# Patient Record
Sex: Male | Born: 1980 | Race: Black or African American | Hispanic: No | Marital: Single | Smoking: Current some day smoker
Health system: Southern US, Community
[De-identification: ages and names within clinical notes are randomized; demographics above are authoritative.]

## PROBLEM LIST (undated history)

## (undated) DIAGNOSIS — J45909 Unspecified asthma, uncomplicated: Secondary | ICD-10-CM

## (undated) DIAGNOSIS — H269 Unspecified cataract: Secondary | ICD-10-CM

## (undated) DIAGNOSIS — H409 Unspecified glaucoma: Secondary | ICD-10-CM

## (undated) DIAGNOSIS — A539 Syphilis, unspecified: Secondary | ICD-10-CM

## (undated) HISTORY — PX: WRIST SURGERY: SHX841

## (undated) HISTORY — DX: Unspecified cataract: H26.9

## (undated) HISTORY — DX: Unspecified glaucoma: H40.9

---

## 2004-01-23 ENCOUNTER — Emergency Department (HOSPITAL_COMMUNITY): Admission: EM | Admit: 2004-01-23 | Discharge: 2004-01-23 | Payer: Self-pay | Admitting: Emergency Medicine

## 2004-01-28 ENCOUNTER — Ambulatory Visit (HOSPITAL_BASED_OUTPATIENT_CLINIC_OR_DEPARTMENT_OTHER): Admission: RE | Admit: 2004-01-28 | Discharge: 2004-01-28 | Payer: Self-pay | Admitting: Orthopedic Surgery

## 2004-01-28 ENCOUNTER — Ambulatory Visit (HOSPITAL_COMMUNITY): Admission: RE | Admit: 2004-01-28 | Discharge: 2004-01-28 | Payer: Self-pay | Admitting: Orthopedic Surgery

## 2008-07-09 ENCOUNTER — Emergency Department (HOSPITAL_COMMUNITY): Admission: EM | Admit: 2008-07-09 | Discharge: 2008-07-09 | Payer: Self-pay | Admitting: Emergency Medicine

## 2010-07-04 ENCOUNTER — Emergency Department (HOSPITAL_COMMUNITY)
Admission: EM | Admit: 2010-07-04 | Discharge: 2010-07-04 | Payer: Self-pay | Source: Home / Self Care | Admitting: Family Medicine

## 2010-11-04 NOTE — Op Note (Signed)
NAME:  DASHAN, CHIZMAR                           ACCOUNT NO.:  [id number]   MEDICAL RECORD NO.:  [id number]                   PATIENT TYPE:  AMB   LOCATION:  DSC                                  FACILITY:  [facility]   PHYSICIAN:  Katy Fitch. Naaman Plummer., M.D.          DATE OF BIRTH:  06/21/1980   DATE OF PROCEDURE:  01/28/2004  DATE OF DISCHARGE:                                 OPERATIVE REPORT   PREOPERATIVE DIAGNOSIS:  Comminuted intra-articular fracture of right distal  radius.   POSTOPERATIVE DIAGNOSIS:  Comminuted intra-articular fracture of right  distal radius.   OPERATION PERFORMED:  Open reduction internal fixation of right distal  radius utilizing a 9 peg DVR plate system.   SURGEON:  Katy Fitch. Sypher, M.D.   ASSISTANT:  Jonni Sanger, P.A.   ANESTHESIA:  Sedation and infraclavicular block.   SUPERVISING ANESTHESIOLOGIST:  Janetta Hora. Gelene Mink, M.D.   INDICATIONS FOR PROCEDURE:  Asahd Can is a 30 year old gentleman who fell  early on the morning of January 23, 2004 sustaining a significant injury to  his right wrist.  He was seen at the [facility] Emergency Department where x-  rays revealed a comminuted intra-articular fracture of his radius.  He was  placed in a sugar tong splint and referred to our office for follow-up.  Clinical examination on January 25, 2004 revealed signs of a significantly  displaced intra-articular fracture of the radius with ulnar styloid  avulsion.  This was a fracture that was unstable and would require internal  fixation.  After informed consent, Darragh was brought to the operating room  at this time with indications for surgery, obtaining and maintaining  anatomic reduction of his fracture utilizing a fixed angled plate system.  Prior to surgery, questions were invited and answered.  He was informed of  potential risks and benefits of surgery, including infection, plate failure,  failure to heal and the need to remove the plate at a later  date.   DESCRIPTION OF PROCEDURE:  Zaviyar Rahal was brought to the operating room and  placed in supine position on the operating table.  Following infraclavicular  block by Dr. Gelene Mink in the holding area, anesthesia was complete in the  right arm.  The arm was prepped with Betadine soap and solution and  sterilely draped.  1 g of Ancef was administered as an IV prophylactic  antibiotic followed by Betadine scrub and paint of the arm.  The arm was  dressed with stockinet and impervious arthroscopy drapes.  Following  exsanguination of the right arm with an Esmarch bandage, an arterial  tourniquet was inflated to 230 mmHg.  The procedure commenced with a  standard DVR incision exposing the flexor carpi radialis.  This was  retracted in an ulnar direction and the fascia at the floor of its canal  released longitudinally.  The flexor pollicis longus was retracted in an  ulnar direction, the radial  artery in the radial direction and the pronator  quadratus carefully elevated with a periosteal elevator.  The fracture was  quite comminuted, particularly along the styloid side.  The fracture was  opened, clot removed.  The fracture fragments were reassembled anatomically  and a 9 peg DVR plate system was applied with standard technique.  Some  challenges were present due to severe impaction dorsally; however, after  considerable effort we obtained a virtually anatomic reduction with only a  slight displacement of several dorsal cortical fragments deep to the  extensor compartments.  The pegs and screws were placed in satisfactory  manner controlled with C-arm fluoroscopy.  After completion of the plate  placement, the wound was thoroughly lavaged with sterile saline followed by  repair of the pronator quadratus with mattress suture of 0 Vicryl and repair  of the skin with subdermal suture of 3-0 Vicryl and intradermal 3-0 Prolene.   Mr. Rodriquez was placed in a compressive dressing with a sugar  tong splint.  We  will maintain him in a supinated position for approximately 10 days followed  by advancement to a fiberglass splint.  Postoperatively we explained the  surgery to his parents in detail.  He was given a prescription for Percocet  5 mg one to two tablets by mouth every four to six hours as needed for pain,  20 tablets without refill.  Also Keflex 500 mg one by mouth every eight  hours times four days as a prophylactic antibiotic.  He is encouraged to use  ibuprofen for milder pain.                                               Katy Fitch Naaman Plummer., M.D.    RVS/MEDQ  D:  01/28/2004  T:  01/29/2004  Job:  [id number]

## 2010-11-04 NOTE — Consult Note (Signed)
[name], [name]                           ACCOUNT NO.:  [id number]   MEDICAL RECORD NO.:  [id number]                   PATIENT TYPE:  EMS   LOCATION:  MAJO                                 FACILITY:  [facility]   PHYSICIAN:  Vanita Panda. Magnus Ivan, M.D.       DATE OF BIRTH:  [birth year]   DATE OF CONSULTATION:  01/23/2004  DATE OF DISCHARGE:                                   CONSULTATION   CHIEF COMPLAINT:  Right wrist pain.   HISTORY OF PRESENT ILLNESS:  Briefly, Eric Stephens is a 30 year old, right hand  dominant male who fell down several steps early this a.m.  He has noted pain  and deformity of his right wrist and came to the emergency room at [facility].  He was found to have a right intraarticular distal radius fracture  with 100% displacement.  Orthopaedic surgery service was consulted.  Upon  examination of the patient, he reported slight decreased sensation in his  fingertips, but denied any other injuries.   PAST MEDICAL HISTORY:  Asthma.   PAST SURGICAL HISTORY:  Negative.   MEDICATIONS:  Multidose inhaler for asthma p.r.n.   ALLERGIES:  No known drug allergies.   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  He  has a positive history for tobacco use, but is only one pack every two days.  He denies drugs, but reports occasional alcohol use.   FAMILY HISTORY:  Noncontributory.   REVIEW OF SYSTEMS:  Negative for chest pain, shortness of breath, nausea,  vomiting, fever, chills, GI, GU complaints.   PHYSICAL EXAMINATION:  VITAL SIGNS:  Blood pressure 108/70; heart rate 66;  respiratory rate 18; temperature 97.5.  GENERAL:  This is an alert and oriented male who is in no acute distress,  but obvious discomfort of his right wrist.  HEENT:  Normocephalic, atraumatic.  Pupils equal, round and reactive to  light.  Extraocular muscles are intact.  NECK:  Supple.  No JVDs, no bruits.  LUNGS:  Clear to auscultation bilaterally.  HEART:  Regular  rate and rhythm.  No murmurs.  ABDOMEN:  Soft, nontender and nondistended.  Normoactive bowel sounds.  EXTREMITIES:  There is obvious deformity of the right wrist.  His skin is  intact.  He has 2+ radial and ulnar pulses.  He has slightly decreased  sensation at his fingertips on his index and long fingers and normal  sensation of the radial and ulnar distribution.  He has positive EPL, FPL,  FDS, FDP and his intrinsics.   X-rays are taken and are significant for a right comminuted distal radius  fracture, which is displaced dorsally and has an intraarticular component.   IMPRESSION:  This is a 30 year old, right hand dominant male with a right  comminuted distal radius fracture, which is intraarticular.   PLAN:  At this juncture, closed reduction was performed by myself in the  emergency room.  He was placed  in a well padded sugar-tong splint.  Post  reduction x-rays did show fracture with adequate reduction.  However, this  was shared with the patient as well as the recommendation that he undergo  open reduction, internal fixation later next week as an outpatient.  I have  instructed him about keeping this arm elevated.  I want him to keep it  elevated and some pain medication.  He will call if there are any questions  or concerns.  We will have him in the clinic either Monday or Tuesday and  get him set up for an outpatient surgery next week.  I explained the risks  and benefits of surgery as well as delaying the surgery.  He stated that he  understood all of these risks.                                               Vanita Panda. Magnus Ivan, M.D.    CYB/MEDQ  D:  01/23/2004  T:  01/24/2004  Job:  [id number]

## 2011-07-12 ENCOUNTER — Emergency Department (HOSPITAL_COMMUNITY): Payer: No Typology Code available for payment source

## 2011-07-12 ENCOUNTER — Emergency Department (HOSPITAL_COMMUNITY)
Admission: EM | Admit: 2011-07-12 | Discharge: 2011-07-12 | Disposition: A | Discharge status: Home / Self Care | Payer: No Typology Code available for payment source | Attending: Emergency Medicine | Admitting: Emergency Medicine

## 2011-07-12 ENCOUNTER — Encounter (HOSPITAL_COMMUNITY): Payer: Self-pay | Admitting: *Deleted

## 2011-07-12 DIAGNOSIS — M546 Pain in thoracic spine: Secondary | ICD-10-CM | POA: Insufficient documentation

## 2011-07-12 DIAGNOSIS — M542 Cervicalgia: Secondary | ICD-10-CM | POA: Insufficient documentation

## 2011-07-12 MED ORDER — IBUPROFEN 800 MG PO TABS
800.0000 mg | ORAL_TABLET | Freq: Once | ORAL | Status: AC
  Administered 2011-07-12: 800 mg via ORAL
  Filled 2011-07-12: qty 1

## 2011-07-12 MED ORDER — HYDROCODONE-ACETAMINOPHEN 5-325 MG PO TABS
1.0000 | ORAL_TABLET | Freq: Once | ORAL | Status: AC
  Administered 2011-07-12: 1 via ORAL
  Filled 2011-07-12: qty 1

## 2011-07-12 MED ORDER — DIAZEPAM 5 MG PO TABS
5.0000 mg | ORAL_TABLET | Freq: Once | ORAL | Status: AC
  Administered 2011-07-12: 5 mg via ORAL
  Filled 2011-07-12: qty 1

## 2011-07-12 MED ORDER — IBUPROFEN 800 MG PO TABS: 800.0000 mg | ORAL_TABLET | Freq: Three times a day (TID) | ORAL | Status: AC | PRN

## 2011-07-12 MED ORDER — DIAZEPAM 5 MG PO TABS: 5.0000 mg | ORAL_TABLET | Freq: Three times a day (TID) | ORAL | Status: AC | PRN

## 2011-07-12 MED ORDER — HYDROCODONE-ACETAMINOPHEN 5-325 MG PO TABS: 1.0000 | ORAL_TABLET | Freq: Four times a day (QID) | ORAL | Status: AC | PRN
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
## 2011-07-12 NOTE — ED Provider Notes (Signed)
Medical screening examination/treatment/procedure(s) were performed by non-physician practitioner and as supervising physician I was immediately available for consultation/collaboration.   Rolan Bucco, MD 07/12/11 1538

## 2013-03-18 ENCOUNTER — Emergency Department (HOSPITAL_COMMUNITY)
Admission: EM | Admit: 2013-03-18 | Discharge: 2013-03-18 | Disposition: A | Discharge status: Home / Self Care | Payer: Self-pay | Attending: Emergency Medicine | Admitting: Emergency Medicine

## 2013-03-18 ENCOUNTER — Emergency Department (HOSPITAL_COMMUNITY): Payer: No Typology Code available for payment source

## 2013-03-18 ENCOUNTER — Encounter (HOSPITAL_COMMUNITY): Payer: Self-pay | Admitting: *Deleted

## 2013-03-18 DIAGNOSIS — Y9301 Activity, walking, marching and hiking: Secondary | ICD-10-CM | POA: Insufficient documentation

## 2013-03-18 DIAGNOSIS — W010XXA Fall on same level from slipping, tripping and stumbling without subsequent striking against object, initial encounter: Secondary | ICD-10-CM | POA: Insufficient documentation

## 2013-03-18 DIAGNOSIS — S82401A Unspecified fracture of shaft of right fibula, initial encounter for closed fracture: Secondary | ICD-10-CM

## 2013-03-18 DIAGNOSIS — S82899A Other fracture of unspecified lower leg, initial encounter for closed fracture: Secondary | ICD-10-CM | POA: Insufficient documentation

## 2013-03-18 DIAGNOSIS — F17200 Nicotine dependence, unspecified, uncomplicated: Secondary | ICD-10-CM | POA: Insufficient documentation

## 2013-03-18 DIAGNOSIS — Y9289 Other specified places as the place of occurrence of the external cause: Secondary | ICD-10-CM | POA: Insufficient documentation

## 2013-03-18 MED ORDER — OXYCODONE-ACETAMINOPHEN 5-325 MG PO TABS
1.0000 | ORAL_TABLET | Freq: Once | ORAL | Status: AC
  Administered 2013-03-18: 1 via ORAL
  Filled 2013-03-18: qty 1

## 2013-03-18 MED ORDER — OXYCODONE-ACETAMINOPHEN 5-325 MG PO TABS: 1.0000 | ORAL_TABLET | Freq: Three times a day (TID) | ORAL | Status: DC | PRN

## 2013-03-18 NOTE — ED Notes (Signed)
Reports tripping and falling down a hill yesterday and now having right ankle pain. No acute distress noted at triage.

## 2013-03-18 NOTE — Progress Notes (Signed)
Orthopedic Tech Progress Note Patient Details:  Eric Stephens 07-11-1980 [id number]  Ortho Devices Type of Ortho Device: Crutches;Post (short leg) splint;Stirrup splint Ortho Device/Splint Interventions: Application   Cammer, Mickie Bail 03/18/2013, 2:27 PM

## 2013-03-18 NOTE — ED Provider Notes (Signed)
CSN: [id number]     Arrival date & time 03/18/13  0906 History   First MD Initiated Contact with Patient 03/18/13 0914     Chief Complaint  Patient presents with  . Fall  . Ankle Pain   (Consider location/radiation/quality/duration/timing/severity/associated sxs/prior Treatment) Patient is a 32 y.o. male presenting with fall and ankle pain. The history is provided by the patient. No language interpreter was used.  Fall Associated symptoms include arthralgias (right ankle). Pertinent negatives include no chest pain, numbness or weakness.  Ankle Pain Eric Stephens is a 32 year old male presenting to the emergency department with right ankle pain that started yesterday. Patient reports that as he was going downhill he accidentally slipped and landed on his right ankle, anterior aspect, in a plantar flexed manner. Patient reports that he noticed right ankle swelling, stated that there is a stinging, pinching sensation that only occurs when he applies pressure to the right ankle. Patient reports that the pain radiates to the top of his right foot. Patient reports that pain worsens when applying pressure, reported feels better when the ankle is elevated. Patient reports he's been elevating the ankle, denied any ice or over-the-counter medication. Patient denied previous injury to the ankle, loss of sensation, numbness, tingling, chest pain, shortness of breath, difficulty breathing. PCP none  History reviewed. No pertinent past medical history. Past Surgical History  Procedure Laterality Date  . Wrist surgery     History reviewed. No pertinent family history. History  Substance Use Topics  . Smoking status: Current Every Day Smoker  . Smokeless tobacco: Not on file  . Alcohol Use: Yes    Review of Systems  Respiratory: Negative for chest tightness and shortness of breath.   Cardiovascular: Negative for chest pain.  Musculoskeletal: Positive for arthralgias (right ankle).  Neurological:  Negative for weakness and numbness.  All other systems reviewed and are negative.    Allergies  Review of patient's allergies indicates no known allergies.  Home Medications   Current Outpatient Rx  Name  Route  Sig  Dispense  Refill  . oxyCODONE-acetaminophen (PERCOCET/ROXICET) 5-325 MG per tablet   Oral   Take 1 tablet by mouth every 8 (eight) hours as needed for pain.   11 tablet   0    BP 106/75  Pulse 64  Temp(Src) 98.1 F (36.7 C) (Oral)  Resp 18  SpO2 98% Physical Exam  Nursing note and vitals reviewed. Constitutional: He is oriented to person, place, and time. He appears well-developed and well-nourished. No distress.  HENT:  Head: Normocephalic and atraumatic.  Eyes: Pupils are equal, round, and reactive to light.  Neck: Normal range of motion. Neck supple.  Cardiovascular: Normal rate, regular rhythm and normal heart sounds.  Exam reveals no friction rub.   No murmur heard. Pulses:      Radial pulses are 2+ on the right side, and 2+ on the left side.       Dorsalis pedis pulses are 2+ on the right side, and 2+ on the left side.  Pulmonary/Chest: Effort normal and breath sounds normal. No respiratory distress. He has no wheezes. He has no rales.  Musculoskeletal: He exhibits tenderness.       Feet:  Swelling noted to the right ankle, circumferential. Negative signs of ecchymosis, erythema, inflammation, puncture wounds. Swelling noted to the dorsal aspect of the right foot, negative ecchymosis, erythema, inflammation, puncture wounds noted. Mild decreased range of motion to the right ankle-inversion, eversion, dorsi flexion, plantar flexion secondary to  pain. Pain upon palpation to the right ankle, circumferential. Full range of motion to the right knee, right digits of the right foot.  Neurological: He is alert and oriented to person, place, and time.  Strength 5+/5+ to digits of the right foot and right ankle, with resistance applied, equal distribution  identified. Sensation intact to bilateral lower extremities with differentiation to sharp and dull touch.  Skin: He is not diaphoretic.    ED Course  Procedures (including critical care time) Labs Review Labs Reviewed - No data to display Imaging Review Dg Ankle Complete Right  03/18/2013   CLINICAL DATA:  Pain post trauma  EXAM: RIGHT ANKLE - COMPLETE 3+ VIEW  COMPARISON:  None.  FINDINGS: Frontal, oblique, and lateral views were obtained. There is a spiral fracture of the distal fibular diaphysis near the diaphysis -metaphysis junction. Alignment is near anatomic. No other fracture. Ankle mortise appears intact. No appreciable joint effusion.  IMPRESSION: Spiral fracture distal fibula.   Electronically Signed   By: Bretta Bang   On: 03/18/2013 10:15   Dg Foot Complete Right  03/18/2013   CLINICAL DATA:  Pain post trauma  EXAM: RIGHT FOOT COMPLETE - 3+ VIEW  COMPARISON:  None.  FINDINGS: Frontal, oblique, and lateral views were obtained. There is a fracture of the distal fibular diaphysis. No other fracture. No dislocation. Joint spaces appear intact. There is pes planus.  IMPRESSION: Fracture distal fibula. In the foot region, there is no demonstrable fracture or dislocation. No arthropathy. Pes planus.   Electronically Signed   By: Bretta Bang   On: 03/18/2013 10:16    MDM   1. Fibula fracture, right, closed, initial encounter     Patient presenting to emergency department with right ankle injury that occurred yesterday while patient was walking downhill, he slipped and landed on his anterior aspect of the right ankle in a plantar flexed manner. Reported that he's been elevating the ankle, denied any ice or medications. Alert and oriented. Swelling noted to the right ankle, circumferential-negative signs of erythema, inflammation, puncture wounds. Decreased range of motion to the right ankle secondary to pain. Strength intact with resistance, equal distribution. Sensation  intact. Pulses palpable bilaterally, strong. Plain films identified spiral fracture of the distal fibular diaphysis near the diaphysis metaphysis junction. Patient placed in splint and given crutches. Patient stable, afebrile. Closed fracture, spiral fracture of the distal fibula of the right leg. Discharged patient with small dose of pain medications-discussed proper dosage use, precautions, disposal techniques. Discussed with patient to report and followup with orthopedics as outpatient. Discussed with patient to elevate the ankle. Discussed with patient to avoid any strenuous activities. Discussed with patient to continue to monitor symptoms and if symptoms are to worsen or change to report back to the emergency department - strict return instructions given. Patient agreed to plan of care, understood, all questions answered.   Raymon Mutton, PA-C 03/18/13 1814

## 2013-03-19 NOTE — ED Provider Notes (Signed)
Medical screening examination/treatment/procedure(s) were performed by non-physician practitioner and as supervising physician I was immediately available for consultation/collaboration.  Doug Sou, MD 03/19/13 1140

## 2015-01-14 ENCOUNTER — Emergency Department (HOSPITAL_COMMUNITY)
Admission: EM | Admit: 2015-01-14 | Discharge: 2015-01-14 | Disposition: A | Discharge status: Home / Self Care | Payer: Self-pay | Attending: Emergency Medicine | Admitting: Emergency Medicine

## 2015-01-14 ENCOUNTER — Emergency Department (HOSPITAL_COMMUNITY): Payer: Self-pay

## 2015-01-14 ENCOUNTER — Encounter (HOSPITAL_COMMUNITY): Payer: Self-pay | Admitting: Emergency Medicine

## 2015-01-14 DIAGNOSIS — Y9389 Activity, other specified: Secondary | ICD-10-CM | POA: Insufficient documentation

## 2015-01-14 DIAGNOSIS — Y9289 Other specified places as the place of occurrence of the external cause: Secondary | ICD-10-CM | POA: Insufficient documentation

## 2015-01-14 DIAGNOSIS — Z720 Tobacco use: Secondary | ICD-10-CM | POA: Insufficient documentation

## 2015-01-14 DIAGNOSIS — S3992XA Unspecified injury of lower back, initial encounter: Secondary | ICD-10-CM | POA: Insufficient documentation

## 2015-01-14 DIAGNOSIS — W108XXA Fall (on) (from) other stairs and steps, initial encounter: Secondary | ICD-10-CM | POA: Insufficient documentation

## 2015-01-14 DIAGNOSIS — Y998 Other external cause status: Secondary | ICD-10-CM | POA: Insufficient documentation

## 2015-01-14 DIAGNOSIS — S7002XA Contusion of left hip, initial encounter: Secondary | ICD-10-CM | POA: Insufficient documentation

## 2015-01-14 MED ORDER — IBUPROFEN 800 MG PO TABS: 800.0000 mg | ORAL_TABLET | Freq: Three times a day (TID) | ORAL | Status: DC

## 2015-01-14 MED ORDER — IBUPROFEN 800 MG PO TABS
800.0000 mg | ORAL_TABLET | Freq: Once | ORAL | Status: AC
  Administered 2015-01-14: 800 mg via ORAL
  Filled 2015-01-14: qty 1

## 2015-01-14 NOTE — ED Notes (Signed)
Bed: [facility] Expected date:  Expected time:  Means of arrival:  Comments: 

## 2015-01-14 NOTE — ED Notes (Signed)
Pt reports falling down steps approximately 1200, ambulatory since, now c/o lower back pain. Denies neck and thoracic pain.   VS 110/79, 72hr, cbg 88

## 2015-01-14 NOTE — ED Provider Notes (Signed)
CSN: [id number]     Arrival date & time 01/14/15  2111 History   First MD Initiated Contact with Patient 01/14/15 2218     Chief Complaint  Patient presents with  . Fall  . Back Pain     (Consider location/radiation/quality/duration/timing/severity/associated sxs/prior Treatment) HPI   34 year old male presents for evaluation of a recent fall. Patient reports he was leaving the building at noon earlier today when he slipped on the stab fall down one step striking his left low back and left hip against the steps. He denies hitting his head or loss of consciousness. He was able to ambulate afterward. He was told that the security that he would need to get it checked out if he wants to file any claim. He is here for further evaluation. Currently patient complaining of sharp throbbing pain to left hip, rated as 7 out of 10, worsening with movement. No specific treatment tried. Denies any radicular pain. No headache, neck pain, chest pain, shortness of breath, abdominal pain, numbness or weakness. Denies any precipitating symptoms prior to the fall.  History reviewed. No pertinent past medical history. Past Surgical History  Procedure Laterality Date  . Wrist surgery     No family history on file. History  Substance Use Topics  . Smoking status: Current Every Day Smoker -- 0.50 packs/day  . Smokeless tobacco: Not on file  . Alcohol Use: No    Review of Systems  Constitutional: Negative for fever.  Musculoskeletal: Positive for back pain.  Skin: Negative for rash and wound.  Neurological: Negative for numbness.      Allergies  Review of patient's allergies indicates no known allergies.  Home Medications   Prior to Admission medications   Medication Sig Start Date End Date Taking? Authorizing Provider  Aspirin-Salicylamide-Caffeine (BC HEADACHE POWDER PO) Take 1 packet by mouth daily as needed (pain).   Yes Historical Provider, MD  oxyCODONE-acetaminophen (PERCOCET/ROXICET)  5-325 MG per tablet Take 1 tablet by mouth every 8 (eight) hours as needed for pain. Patient not taking: Reported on 01/14/2015 03/18/13   Marissa Sciacca, PA-C   BP 111/66 mmHg  Pulse 65  Temp(Src) 98.1 F (36.7 C) (Oral)  Resp 18  SpO2 100% Physical Exam  Constitutional: He appears well-developed and well-nourished. No distress.  African-American male laying in bed, appears to be in no acute distress.  HENT:  Head: Atraumatic.  Eyes: Conjunctivae are normal.  Neck: Neck supple.  Musculoskeletal: He exhibits tenderness (Left hip. Tenderness to greater trochanter to palpation without overlying skin changes. Normal hip flexion, extension, abduction abduction. No gross deformity or crepitus.).  No significant midline spine tenderness crepitus step-off. No pain to left knee or left ankle.  Neurological: He is alert.  Able to ambulate.  Skin: No rash noted.  Psychiatric: He has a normal mood and affect.  Nursing note and vitals reviewed.   ED Course  Procedures (including critical care time)  Patient had a mechanical fall and complaining of left hip pain. X-ray of left hip shows no evidence of acute fractures or dislocation. Patient is able to ambulate without difficulty. Ibuprofen given for pain. Rice therapy discussed. Orthopedic referral given as needed. Return precautions discussed.  Labs Review Labs Reviewed - No data to display  Imaging Review Dg Hip Unilat With Pelvis 2-3 Views Left  01/14/2015   CLINICAL DATA:  Larey Seat today, striking left hip on the corner of a shelf  EXAM: DG HIP (WITH OR WITHOUT PELVIS) 2-3V LEFT  COMPARISON:  None.  FINDINGS: There is no evidence of hip fracture or dislocation. There is no evidence of arthropathy or other focal bone abnormality.  IMPRESSION: Negative.   Electronically Signed   By: Ellery Plunk M.D.   On: 01/14/2015 21:47     EKG Interpretation None      MDM   Final diagnoses:  Contusion of left hip, initial encounter    BP  111/66 mmHg  Pulse 65  Temp(Src) 98.1 F (36.7 C) (Oral)  Resp 18  SpO2 100%  I have reviewed nursing notes and vital signs. I personally viewed the imaging tests through PACS system and agrees with radiologist's intepretation I reviewed available ER/hospitalization records through the EMR     Fayrene Helper, PA-C 01/14/15 2301  Devoria Albe, MD 01/14/15 2306

## 2015-01-14 NOTE — ED Notes (Signed)
Bed: WA17 Expected date:  Expected time:  Means of arrival:  Comments: EMS fall back pain and left hip pain

## 2015-01-14 NOTE — ED Notes (Signed)
Patient transported to X-ray 

## 2015-01-14 NOTE — Discharge Instructions (Signed)
RICE: Routine Care for Injuries The routine care of many injuries includes Rest, Ice, Compression, and Elevation (RICE). HOME CARE INSTRUCTIONS  Rest is needed to allow your body to heal. Routine activities can usually be resumed when comfortable. Injured tendons and bones can take up to 6 weeks to heal. Tendons are the cord-like structures that attach muscle to bone.  Ice following an injury helps keep the swelling down and reduces pain.  Put ice in a plastic bag.  Place a towel between your skin and the bag.  Leave the ice on for 15-20 minutes, 3-4 times a day, or as directed by your health care provider. Do this while awake, for the first 24 to 48 hours. After that, continue as directed by your caregiver.  Compression helps keep swelling down. It also gives support and helps with discomfort. If an elastic bandage has been applied, it should be removed and reapplied every 3 to 4 hours. It should not be applied tightly, but firmly enough to keep swelling down. Watch fingers or toes for swelling, bluish discoloration, coldness, numbness, or excessive pain. If any of these problems occur, remove the bandage and reapply loosely. Contact your caregiver if these problems continue.  Elevation helps reduce swelling and decreases pain. With extremities, such as the arms, hands, legs, and feet, the injured area should be placed near or above the level of the heart, if possible. SEEK IMMEDIATE MEDICAL CARE IF:  You have persistent pain and swelling.  You develop redness, numbness, or unexpected weakness.  Your symptoms are getting worse rather than improving after several days. These symptoms may indicate that further evaluation or further X-rays are needed. Sometimes, X-rays may not show a small broken bone (fracture) until 1 week or 10 days later. Make a follow-up appointment with your caregiver. Ask when your X-ray results will be ready. Make sure you get your X-ray results. Document Released:  09/17/2000 Document Revised: 06/10/2013 Document Reviewed: 11/04/2010 Exit[employer] Patient Information 2015 [employer]. This information is not intended to replace advice given to you by your health care provider. Make sure you discuss any questions you have with your health care provider.  

## 2015-12-29 ENCOUNTER — Emergency Department (HOSPITAL_COMMUNITY)
Admission: EM | Admit: 2015-12-29 | Discharge: 2015-12-29 | Disposition: A | Discharge status: Home / Self Care | Payer: Self-pay | Attending: Emergency Medicine | Admitting: Emergency Medicine

## 2015-12-29 ENCOUNTER — Encounter (HOSPITAL_COMMUNITY): Payer: Self-pay | Admitting: Emergency Medicine

## 2015-12-29 ENCOUNTER — Emergency Department (HOSPITAL_COMMUNITY): Payer: Self-pay

## 2015-12-29 DIAGNOSIS — Z5181 Encounter for therapeutic drug level monitoring: Secondary | ICD-10-CM | POA: Insufficient documentation

## 2015-12-29 DIAGNOSIS — F17200 Nicotine dependence, unspecified, uncomplicated: Secondary | ICD-10-CM | POA: Insufficient documentation

## 2015-12-29 DIAGNOSIS — R410 Disorientation, unspecified: Secondary | ICD-10-CM | POA: Insufficient documentation

## 2015-12-29 DIAGNOSIS — B20 Human immunodeficiency virus [HIV] disease: Secondary | ICD-10-CM | POA: Insufficient documentation

## 2015-12-29 DIAGNOSIS — R4781 Slurred speech: Secondary | ICD-10-CM | POA: Insufficient documentation

## 2015-12-29 HISTORY — DX: Syphilis, unspecified: A53.9

## 2015-12-29 LAB — CBC WITH DIFFERENTIAL/PLATELET
Basophils Absolute: 0 10*3/uL (ref 0.0–0.1)
Basophils Relative: 0 %
EOS PCT: 1 %
Eosinophils Absolute: 0.1 10*3/uL (ref 0.0–0.7)
HEMATOCRIT: 36.4 % — AB (ref 39.0–52.0)
HEMOGLOBIN: 12.2 g/dL — AB (ref 13.0–17.0)
LYMPHS ABS: 2.9 10*3/uL (ref 0.7–4.0)
LYMPHS PCT: 37 %
MCH: 29.5 pg (ref 26.0–34.0)
MCHC: 33.5 g/dL (ref 30.0–36.0)
MCV: 87.9 fL (ref 78.0–100.0)
Monocytes Absolute: 0.5 10*3/uL (ref 0.1–1.0)
Monocytes Relative: 7 %
NEUTROS ABS: 4.2 10*3/uL (ref 1.7–7.7)
Neutrophils Relative %: 55 %
PLATELETS: 233 10*3/uL (ref 150–400)
RBC: 4.14 MIL/uL — AB (ref 4.22–5.81)
RDW: 13.5 % (ref 11.5–15.5)
WBC: 7.7 10*3/uL (ref 4.0–10.5)

## 2015-12-29 LAB — COMPREHENSIVE METABOLIC PANEL
ALK PHOS: 79 U/L (ref 38–126)
ALT: 13 U/L — AB (ref 17–63)
AST: 16 U/L (ref 15–41)
Albumin: 3.9 g/dL (ref 3.5–5.0)
Anion gap: 7 (ref 5–15)
BILIRUBIN TOTAL: 0.4 mg/dL (ref 0.3–1.2)
BUN: 11 mg/dL (ref 6–20)
CALCIUM: 8.9 mg/dL (ref 8.9–10.3)
CO2: 25 mmol/L (ref 22–32)
CREATININE: 0.77 mg/dL (ref 0.61–1.24)
Chloride: 108 mmol/L (ref 101–111)
Glucose, Bld: 99 mg/dL (ref 65–99)
Potassium: 3.4 mmol/L — ABNORMAL LOW (ref 3.5–5.1)
Sodium: 140 mmol/L (ref 135–145)
TOTAL PROTEIN: 6.9 g/dL (ref 6.5–8.1)

## 2015-12-29 LAB — RAPID URINE DRUG SCREEN, HOSP PERFORMED
Amphetamines: NOT DETECTED
Barbiturates: NOT DETECTED
Benzodiazepines: NOT DETECTED
Cocaine: NOT DETECTED
OPIATES: NOT DETECTED
Tetrahydrocannabinol: NOT DETECTED

## 2015-12-29 LAB — AMMONIA: AMMONIA: 60 umol/L — AB (ref 9–35)

## 2015-12-29 LAB — ETHANOL

## 2015-12-29 NOTE — ED Notes (Addendum)
[facility] father at bedside noticed change in patients mentation. Pt seems more confused and forgetful since in custody and now out symptoms continue. Denies psych hx  Oriented to self and date of birth. NAD at triage.   Addendum: states he was dx with HIV and syphilis while in prison, unsure if any treatment was given while in jail.

## 2015-12-29 NOTE — ED Notes (Signed)
Pt ambulatory and independent at discharge.  Left with father.  Verbalized understanding of discharge instructions.

## 2015-12-29 NOTE — ED Notes (Signed)
Patient transported to CT 

## 2015-12-29 NOTE — ED Provider Notes (Signed)
CSN: [id number]     Arrival date & time 12/29/15  1619 History   First MD Initiated Contact with Patient 12/29/15 2207     Chief Complaint  Patient presents with  . Altered Mental Status     History obtained by patient and his father. Patient is a 35 y.o. male presenting with altered mental status.  Altered Mental Status Presenting symptoms: confusion   Associated symptoms: no abdominal pain, no headaches, no nausea, no rash, no vomiting and no weakness   Patient's been a little more confused recently. Slight headaches. The headaches in both temples. Has been out of jail for the last few months but reportedly in jail was diagnosed with HIV and syphilis. States he had 2 different shots for it but did not have follow-up.  Past Medical History  Diagnosis Date  . HIV (human immunodeficiency virus infection) (HCC)   . Syphilis    Past Surgical History  Procedure Laterality Date  . Wrist surgery     No family history on file. Social History  Substance Use Topics  . Smoking status: Current Every Day Smoker -- 0.50 packs/day  . Smokeless tobacco: None  . Alcohol Use: No    Review of Systems  Constitutional: Negative for activity change and appetite change.  Eyes: Negative for pain.  Respiratory: Negative for chest tightness and shortness of breath.   Cardiovascular: Negative for chest pain and leg swelling.  Gastrointestinal: Negative for nausea, vomiting, abdominal pain and diarrhea.  Genitourinary: Negative for flank pain.  Musculoskeletal: Negative for back pain and neck stiffness.  Skin: Negative for rash.  Neurological: Negative for weakness, numbness and headaches.  Psychiatric/Behavioral: Positive for confusion. Negative for behavioral problems.      Allergies  Review of patient's allergies indicates no known allergies.  Home Medications   Prior to Admission medications   Medication Sig Start Date End Date Taking? Authorizing Provider   Aspirin-Salicylamide-Caffeine (BC HEADACHE POWDER PO) Take 1 packet by mouth daily as needed (pain).    Historical Provider, MD  ibuprofen (ADVIL,MOTRIN) 800 MG tablet Take 1 tablet (800 mg total) by mouth 3 (three) times daily. Patient not taking: Reported on 12/29/2015 01/14/15   Fayrene HelperBowie Tran, PA-C  oxyCODONE-acetaminophen (PERCOCET/ROXICET) 5-325 MG per tablet Take 1 tablet by mouth every 8 (eight) hours as needed for pain. Patient not taking: Reported on 01/14/2015 03/18/13   Marissa Sciacca, PA-C   BP 118/65 mmHg  Pulse 60  Temp(Src) 99.7 F (37.6 C) (Oral)  Resp 18  Ht 6' (1.829 m)  Wt 212 lb (96.163 kg)  BMI 28.75 kg/m2  SpO2 100% Physical Exam  Constitutional: He is oriented to person, place, and time. He appears well-developed and well-nourished.  HENT:  Head: Normocephalic and atraumatic.  Eyes: EOM are normal. Pupils are equal, round, and reactive to light.  Neck: Normal range of motion. Neck supple.  Cardiovascular: Normal rate, regular rhythm and normal heart sounds.   No murmur heard. Pulmonary/Chest: Effort normal and breath sounds normal.  Abdominal: Soft. Bowel sounds are normal. He exhibits no distension and no mass. There is no tenderness. There is no rebound and no guarding.  Musculoskeletal: Normal range of motion. He exhibits no edema.  Neurological: He is alert and oriented to person, place, and time. No cranial nerve deficit.  slightly slurred speech. Baseline slurred but maybe a little more than baseline.  Skin: Skin is warm and dry.  Psychiatric: He has a normal mood and affect.  Nursing note and vitals reviewed.  ED Course  Procedures (including critical care time) Labs Review Labs Reviewed  CBC WITH DIFFERENTIAL/PLATELET - Abnormal; Notable for the following:    RBC 4.14 (*)    Hemoglobin 12.2 (*)    HCT 36.4 (*)    All other components within normal limits  COMPREHENSIVE METABOLIC PANEL - Abnormal; Notable for the following:    Potassium 3.4 (*)     ALT 13 (*)    All other components within normal limits  AMMONIA - Abnormal; Notable for the following:    Ammonia 60 (*)    All other components within normal limits  ETHANOL  URINE RAPID DRUG SCREEN, HOSP PERFORMED  T-HELPER CELLS (CD4) COUNT (NOT AT [facility])  HIV-1 RNA ULTRAQUANT REFLEX TO GENTYP+  RPR    Imaging Review Dg Chest 2 View  12/29/2015  CLINICAL DATA:  Smoker.  Altered mental status.  History of HIV. EXAM: CHEST  2 VIEW COMPARISON:  None. FINDINGS: The heart size and mediastinal contours are within normal limits. Both lungs are clear. The visualized skeletal structures are unremarkable. IMPRESSION: No active cardiopulmonary disease. Electronically Signed   By: Burman Nieves M.D.   On: 12/29/2015 22:43   Ct Head Wo Contrast  12/29/2015  CLINICAL DATA:  Confusion, altered mental status. History of HIV and syphilis. EXAM: CT HEAD WITHOUT CONTRAST TECHNIQUE: Contiguous axial images were obtained from the base of the skull through the vertex without intravenous contrast. COMPARISON:  None available for comparison at time of study interpretation. FINDINGS: INTRACRANIAL CONTENTS: The mild ventriculomegaly on the basis of global parenchymal brain volume loss. No intraparenchymal hemorrhage, mass effect nor midline shift. No acute large vascular territory infarcts. No abnormal extra-axial fluid collections. Basal cisterns are patent. ORBITS: The included ocular globes and orbital contents are normal. SINUSES: The mastoid aircells and included paranasal sinuses are well-aerated. SKULL/SOFT TISSUES: No skull fracture. No significant soft tissue swelling. IMPRESSION: No acute intracranial process. Mild global parenchymal brain volume loss for age. Electronically Signed   By: Awilda Metro M.D.   On: 12/29/2015 22:38   I have personally reviewed and evaluated these images and lab results as part of my medical decision-making.   EKG Interpretation None      MDM   Final diagnoses:   Confusion    Patient with slight confusion. Head CT reassuring. Ammonia mildly elevated of unclear significance. Reportedly his HIV and syphilis positive. Labs ordered and will follow-up with infectious disease. Discharge home.    Benjiman Core, MD 12/30/15 [phone]

## 2015-12-29 NOTE — ED Notes (Signed)
Pt A&O x4 at this time but has very scattered thoughts and actions.

## 2015-12-29 NOTE — ED Notes (Signed)
RN starting IV 

## 2015-12-29 NOTE — Discharge Instructions (Signed)
Confusion Confusion is the inability to think with your usual speed or clarity. Confusion may come on quickly or slowly over time. How quickly the confusion comes on depends on the cause. Confusion can be due to any number of causes. CAUSES   Concussion, head injury, or head trauma.  Seizures.  Stroke.  Fever.  Brain tumor.  Age related decreased brain function (dementia).  Heightened emotional states like rage or terror.  Mental illness in which the person loses the ability to determine what is real and what is not (hallucinations).  Infections such as a urinary tract infection (UTI).  Toxic effects from alcohol, drugs, or prescription medicines.  Dehydration and an imbalance of salts in the body (electrolytes).  Lack of sleep.  Low blood sugar (diabetes).  Low levels of oxygen from conditions such as chronic lung disorders.  Drug interactions or other medicine side effects.  Nutritional deficiencies, especially niacin, thiamine, vitamin C, or vitamin B.  Sudden drop in body temperature (hypothermia).  Change in routine, such as when traveling or hospitalized. SIGNS AND SYMPTOMS  People often describe their thinking as cloudy or unclear when they are confused. Confusion can also include feeling disoriented. That means you are unaware of where or who you are. You may also not know what the date or time is. If confused, you may also have difficulty paying attention, remembering, and making decisions. Some people also act aggressively when they are confused.  DIAGNOSIS  The medical evaluation of confusion may include:  Blood and urine tests.  X-rays.  Brain and nervous system tests.  Analyzing your brain waves (electroencephalogram or EEG).  Magnetic resonance imaging (MRI) of your head.  Computed tomography (CT) scan of your head.  Mental status tests in which your health care provider may ask many questions. Some of these questions may seem silly or strange,  but they are a very important test to help diagnose and treat confusion. TREATMENT  An admission to the hospital may not be needed, but a person with confusion should not be left alone. Stay with a family member or friend until the confusion clears. Avoid alcohol, pain relievers, or sedative drugs until you have fully recovered. Do not drive until directed by your health care provider. HOME CARE INSTRUCTIONS  What family and friends can do:  To find out if someone is confused, ask the person to state his or her name, age, and the date. If the person is unsure or answers incorrectly, he or she is confused.  Always introduce yourself, no matter how well the person knows you.  Often remind the person of his or her location.  Place a calendar and clock near the confused person.  Help the person with his or her medicines. You may want to use a pill box, an alarm as a reminder, or give the person each dose as prescribed.  Talk about current events and plans for the day.  Try to keep the environment calm, quiet, and peaceful.  Make sure the person keeps follow-up visits with his or her health care provider. PREVENTION  Ways to prevent confusion:  Avoid alcohol.  Eat a balanced diet.  Get enough sleep.  Take medicine only as directed by your health care provider.  Do not become isolated. Spend time with other people and make plans for your days.  Keep careful watch on your blood sugar levels if you are diabetic. SEEK IMMEDIATE MEDICAL CARE IF:   You develop severe headaches, repeated vomiting, seizures, blackouts, or   slurred speech.  There is increasing confusion, weakness, numbness, restlessness, or personality changes.  You develop a loss of balance, have marked dizziness, feel uncoordinated, or fall.  You have delusions, hallucinations, or develop severe anxiety.  Your family members think you need to be rechecked.   This information is not intended to replace advice given  to you by your health care provider. Make sure you discuss any questions you have with your health care provider.   Document Released: 07/13/2004 Document Revised: 06/26/2014 Document Reviewed: 07/11/2013 Elsevier Interactive Patient Education 2016 [employer]  

## 2015-12-30 ENCOUNTER — Emergency Department (HOSPITAL_COMMUNITY)
Admission: EM | Admit: 2015-12-30 | Discharge: 2016-01-02 | Disposition: A | Discharge status: Other Acute Inpatient Hospital | Payer: No Typology Code available for payment source | Attending: Emergency Medicine | Admitting: Emergency Medicine

## 2015-12-30 ENCOUNTER — Encounter (HOSPITAL_COMMUNITY): Payer: Self-pay | Admitting: *Deleted

## 2015-12-30 DIAGNOSIS — R461 Bizarre personal appearance: Secondary | ICD-10-CM | POA: Insufficient documentation

## 2015-12-30 DIAGNOSIS — B20 Human immunodeficiency virus [HIV] disease: Secondary | ICD-10-CM | POA: Insufficient documentation

## 2015-12-30 DIAGNOSIS — R462 Strange and inexplicable behavior: Secondary | ICD-10-CM

## 2015-12-30 LAB — COMPREHENSIVE METABOLIC PANEL
ALBUMIN: 3.5 g/dL (ref 3.5–5.0)
ALK PHOS: 72 U/L (ref 38–126)
ALT: 14 U/L — AB (ref 17–63)
ANION GAP: 5 (ref 5–15)
AST: 15 U/L (ref 15–41)
BILIRUBIN TOTAL: 0.3 mg/dL (ref 0.3–1.2)
BUN: 9 mg/dL (ref 6–20)
CALCIUM: 8.8 mg/dL — AB (ref 8.9–10.3)
CO2: 26 mmol/L (ref 22–32)
CREATININE: 0.75 mg/dL (ref 0.61–1.24)
Chloride: 108 mmol/L (ref 101–111)
GFR calc Af Amer: >60 mL/min (ref >60)
GFR calc non Af Amer: >60 mL/min (ref >60)
GLUCOSE: 79 mg/dL (ref 65–99)
Potassium: 3.5 mmol/L (ref 3.5–5.1)
Sodium: 139 mmol/L (ref 135–145)
TOTAL PROTEIN: 6.6 g/dL (ref 6.5–8.1)

## 2015-12-30 LAB — RAPID URINE DRUG SCREEN, HOSP PERFORMED
AMPHETAMINES: NOT DETECTED
BENZODIAZEPINES: NOT DETECTED
Barbiturates: NOT DETECTED
Cocaine: NOT DETECTED
Opiates: NOT DETECTED
TETRAHYDROCANNABINOL: NOT DETECTED

## 2015-12-30 LAB — T-HELPER CELLS (CD4) COUNT (NOT AT ARMC)
CD4 T CELL ABS: 1820 /uL (ref 400–2700)
CD4 T CELL HELPER: 50 % (ref 33–55)

## 2015-12-30 LAB — CBC WITH DIFFERENTIAL/PLATELET
BASOS PCT: 0 %
Basophils Absolute: 0 10*3/uL (ref 0.0–0.1)
Eosinophils Absolute: 0.1 10*3/uL (ref 0.0–0.7)
Eosinophils Relative: 1 %
HEMATOCRIT: 37.4 % — AB (ref 39.0–52.0)
HEMOGLOBIN: 12.1 g/dL — AB (ref 13.0–17.0)
LYMPHS ABS: 5 10*3/uL — AB (ref 0.7–4.0)
Lymphocytes Relative: 45 %
MCH: 28.7 pg (ref 26.0–34.0)
MCHC: 32.4 g/dL (ref 30.0–36.0)
MCV: 88.8 fL (ref 78.0–100.0)
MONOS PCT: 7 %
Monocytes Absolute: 0.7 10*3/uL (ref 0.1–1.0)
NEUTROS ABS: 5.3 10*3/uL (ref 1.7–7.7)
NEUTROS PCT: 47 %
Platelets: 248 10*3/uL (ref 150–400)
RBC: 4.21 MIL/uL — AB (ref 4.22–5.81)
RDW: 13.3 % (ref 11.5–15.5)
WBC: 11.2 10*3/uL — ABNORMAL HIGH (ref 4.0–10.5)

## 2015-12-30 LAB — ETHANOL: Alcohol, Ethyl (B): <5 mg/dL (ref <5)

## 2015-12-30 NOTE — ED Provider Notes (Signed)
CSN: [id number]     Arrival date & time 12/30/15  1630 History   First MD Initiated Contact with Patient 12/30/15 1747     Chief Complaint  Patient presents with  . Medical Clearance     (Consider location/radiation/quality/duration/timing/severity/associated sxs/prior Treatment) HPI Comments: Patient is a 35 year old male who presents to the emergency department 2 days after getting out of prison. He was allegedly diagnosed with syphilis and HIV while incarcerated. His father brings him to the emergency department this evening over concern for a "mental breakdown". Father states that the patient has not been acting himself and he does not know why. Father does report noticing changes in the patient approximately one year ago. He has been incarcerated for the past 6 months. Patient denies any suicidal or homicidal thoughts. He does endorse experiencing hallucinations at times, but will not expand on this very much. Father states that the patient was evaluated at [facility], but was deemed appropriate for discharge. No recent fevers and patient not on any medications currently. He was seen in the ED at [facility] yesterday and discharged with instruction to f/u with infectious disease.  The history is provided by the patient and a parent. No language interpreter was used.    Past Medical History  Diagnosis Date  . HIV (human immunodeficiency virus infection) (HCC)   . Syphilis    Past Surgical History  Procedure Laterality Date  . Wrist surgery     No family history on file. Social History  Substance Use Topics  . Smoking status: Current Every Day Smoker -- 0.50 packs/day  . Smokeless tobacco: None  . Alcohol Use: No    Review of Systems  Psychiatric/Behavioral: Positive for hallucinations and behavioral problems. Negative for suicidal ideas.  All other systems reviewed and are negative.   Allergies  Review of patient's allergies indicates no known allergies.  Home Medications   Prior  to Admission medications   Medication Sig Start Date End Date Taking? Authorizing Provider  ibuprofen (ADVIL,MOTRIN) 800 MG tablet Take 1 tablet (800 mg total) by mouth 3 (three) times daily. Patient not taking: Reported on 12/29/2015 01/14/15   Fayrene Helper, PA-C  oxyCODONE-acetaminophen (PERCOCET/ROXICET) 5-325 MG per tablet Take 1 tablet by mouth every 8 (eight) hours as needed for pain. Patient not taking: Reported on 01/14/2015 03/18/13   Marissa Sciacca, PA-C   BP 132/72 mmHg  Pulse 62  Temp(Src) 98.6 F (37 C) (Oral)  Resp 18  SpO2 100%   Physical Exam  Constitutional: He is oriented to person, place, and time. He appears well-developed and well-nourished. No distress.  HENT:  Head: Normocephalic and atraumatic.  Eyes: Conjunctivae and EOM are normal. No scleral icterus.  Neck: Normal range of motion.  Pulmonary/Chest: Effort normal. No respiratory distress.  Musculoskeletal: Normal range of motion.  Neurological: He is alert and oriented to person, place, and time.  Skin: Skin is warm and dry. No rash noted. He is not diaphoretic. No erythema. No pallor.  Psychiatric: His speech is delayed. He is slowed and withdrawn. He exhibits a depressed mood. He expresses no homicidal and no suicidal ideation.  Patient mumbling. Flat affect.  Nursing note and vitals reviewed.   ED Course  Procedures (including critical care time) Labs Review Labs Reviewed  CBC WITH DIFFERENTIAL/PLATELET - Abnormal; Notable for the following:    WBC 11.2 (*)    RBC 4.21 (*)    Hemoglobin 12.1 (*)    HCT 37.4 (*)    Lymphs Abs  5.0 (*)    All other components within normal limits  COMPREHENSIVE METABOLIC PANEL - Abnormal; Notable for the following:    Calcium 8.8 (*)    ALT 14 (*)    All other components within normal limits  URINE RAPID DRUG SCREEN, HOSP PERFORMED  ETHANOL    Imaging Review Dg Chest 2 View  12/29/2015  CLINICAL DATA:  Smoker.  Altered mental status.  History of HIV. EXAM: CHEST   2 VIEW COMPARISON:  None. FINDINGS: The heart size and mediastinal contours are within normal limits. Both lungs are clear. The visualized skeletal structures are unremarkable. IMPRESSION: No active cardiopulmonary disease. Electronically Signed   By: Burman NievesWilliam  Stevens M.D.   On: 12/29/2015 22:43   Ct Head Wo Contrast  12/29/2015  CLINICAL DATA:  Confusion, altered mental status. History of HIV and syphilis. EXAM: CT HEAD WITHOUT CONTRAST TECHNIQUE: Contiguous axial images were obtained from the base of the skull through the vertex without intravenous contrast. COMPARISON:  None available for comparison at time of study interpretation. FINDINGS: INTRACRANIAL CONTENTS: The mild ventriculomegaly on the basis of global parenchymal brain volume loss. No intraparenchymal hemorrhage, mass effect nor midline shift. No acute large vascular territory infarcts. No abnormal extra-axial fluid collections. Basal cisterns are patent. ORBITS: The included ocular globes and orbital contents are normal. SINUSES: The mastoid aircells and included paranasal sinuses are well-aerated. SKULL/SOFT TISSUES: No skull fracture. No significant soft tissue swelling. IMPRESSION: No acute intracranial process. Mild global parenchymal brain volume loss for age. Electronically Signed   By: Awilda Metroourtnay  Bloomer M.D.   On: 12/29/2015 22:38   I have personally reviewed and evaluated these images and lab results as part of my medical decision-making.   EKG Interpretation None      MDM   Final diagnoses:  Bizarre behavior    Patient presenting to the emergency department today with his father over concern for a "mental breakdown". Patient was released from jail 2 days ago after a 6 month incarceration. [facility] status and discharged with instruction for infectious disease follow-up given that patient allegedly tested positive for HIV and syphilis recently. Father states that he noticed  behavior changes with the patient ~1 year ago, prior to his incarceration.  TTS evaluation and recommendations are pending. Patient medically cleared. There is mild leukocytosis compared to 24 hours prior, but no left shift. Patient afebrile. Disposition to be determined by oncoming ED provider.   Filed Vitals:   12/30/15 2100 12/30/15 2115 12/30/15 2130 12/30/15 2145  BP: 132/72 121/73 134/90 124/79  Pulse: 62 69 69 73  Temp:      TempSrc:      Resp:    16  SpO2: 100% 100% 100% 99%     Antony MaduraKelly Jenavive Lamboy, PA-C 12/31/15 0525  Leta BaptistEmily Roe Nguyen, MD 01/10/16 [phone]

## 2015-12-30 NOTE — ED Notes (Addendum)
pts father states that pt has been talking to himself and that pt has been mumbling. pts father states that pt mentioned killing himself today but the pt denies this. Pt states he said, "its over for me." Pts father states he was incarcerated for the past 6 months. Pt denies any SI/HI thoughts. Father is concerned pt had a mental breakdown.

## 2015-12-31 LAB — RPR, QUANT+TP ABS (REFLEX): TREPONEMA PALLIDUM AB: POSITIVE — AB

## 2015-12-31 LAB — HIV-1 RNA ULTRAQUANT REFLEX TO GENTYP+: HIV-1 RNA QUANT, LOG: UNDETERMINED {Log_copies}/mL

## 2015-12-31 LAB — RPR: RPR: REACTIVE — AB

## 2015-12-31 NOTE — ED Notes (Signed)
Regular diet tray ordered by Selena BattenKim, NS

## 2015-12-31 NOTE — BH Assessment (Addendum)
Tele Assessment Note   Eric Stephens is a 35 y.o. male who presents voluntarily to [facility], bib his father due to increased confusion and depression. Father also shared that pt had been talking constantly with much of it being incoherent mumbling. Pt was very hard to understand b/c he had a stuttering, mumbling speech and he appeared to be experiencing thought blocking. Pt indicated that he was at the ED b/c he "need some help". When asked what kind of help pt needed, he replied, "mental help". Pt then shared that he was "suicidal at first but I don't want to..I want to be released". Pt then circled back to needing help. Pt endorsed VH, stating that he saw walls and ceilings that no one else saw. Pt appeared to be delusional and displayed a decreased ability to function with possible psychosis.   Diagnosis: Bipolar I disorder, Current or most recent episode hypomanic  Past Medical History:  Past Medical History  Diagnosis Date  . HIV (human immunodeficiency virus infection) (HCC)   . Syphilis     Past Surgical History  Procedure Laterality Date  . Wrist surgery      Family History: No family history on file.  Social History:  reports that he has been smoking.  He does not have any smokeless tobacco history on file. He reports that he does not drink alcohol or use illicit drugs.  Additional Social History:  Alcohol / Drug Use Pain Medications: pt denies Prescriptions: pt denies Over the Counter: pt denies History of alcohol / drug use?: No history of alcohol / drug abuse  CIWA: CIWA-Ar BP: 124/71 mmHg Pulse Rate: 77 COWS:    PATIENT STRENGTHS: (choose at least two) Motivation for treatment/growth Supportive family/friends  Allergies: No Known Allergies  Home Medications:  (Not in a hospital admission)  OB/GYN Status:  No LMP for male patient.  General Assessment Data Location of Assessment: [facility] ED TTS Assessment: In system Is this a Tele or Face-to-Face Assessment?: Tele  Assessment Is this an Initial Assessment or a Re-assessment for this encounter?: Initial Assessment Marital status: Single Is patient pregnant?: No Pregnancy Status: No Living Arrangements: Parent Can pt return to current living arrangement?: Yes Admission Status: Voluntary Is patient capable of signing voluntary admission?: Yes Referral Source: Self/Family/Friend Insurance type: none     Crisis Care Plan Living Arrangements: Parent Name of Psychiatrist: none Name of Therapist: none  Education Status Is patient currently in school?: No  Risk to self with the past 6 months Suicidal Ideation: No-Not Currently/Within Last 6 Months Has patient been a risk to self within the past 6 months prior to admission? : No Suicidal Intent: No Has patient had any suicidal intent within the past 6 months prior to admission? : No Is patient at risk for suicide?: No Suicidal Plan?: No Has patient had any suicidal plan within the past 6 months prior to admission? : No Access to Means: No What has been your use of drugs/alcohol within the last 12 months?: none Previous Attempts/Gestures: No Intentional Self Injurious Behavior: None Family Suicide History: Unknown Persecutory voices/beliefs?: No Depression: Yes Substance abuse history and/or treatment for substance abuse?: No Suicide prevention information given to non-admitted patients: Not applicable  Risk to Others within the past 6 months Homicidal Ideation: No Does patient have any lifetime risk of violence toward others beyond the six months prior to admission? : No Thoughts of Harm to Others: No Current Homicidal Intent: No Current Homicidal Plan: No Access to Homicidal Means:  No History of harm to others?: No Assessment of Violence: None Noted Does patient have access to weapons?: No Criminal Charges Pending?: No Does patient have a court date: No Is patient on probation?: Unknown  Psychosis Hallucinations: Visual (pt reports  visual hallucinations of walls and ceilings) Delusions: Unspecified  Mental Status Report Appearance/Hygiene: Bizarre Eye Contact: Fair Motor Activity: Other (Comment) (pt rocking back and forth throughout assessment) Speech: Incoherent, Logical/coherent Level of Consciousness: Alert Mood: Depressed, Anxious, Helpless Affect: Appropriate to circumstance Anxiety Level: Moderate Thought Processes: Thought Blocking Judgement: Partial Orientation: Person, Place, Time, Situation Obsessive Compulsive Thoughts/Behaviors: None  Cognitive Functioning Concentration: Normal Memory: Unable to Assess IQ: Average Insight: see judgement above Impulse Control: Good Appetite: Good Sleep: Decreased Vegetative Symptoms: None  ADLScreening [facility] Assessment Services) Patient's cognitive ability adequate to safely complete daily activities?: Yes Patient able to express need for assistance with ADLs?: Yes Independently performs ADLs?: Yes (appropriate for developmental age)  Prior Inpatient Therapy Prior Inpatient Therapy: No  Prior Outpatient Therapy Prior Outpatient Therapy: No Does patient have an ACCT team?: No Does patient have Intensive In-House Services?  : No Does patient have [facility] services? : No Does patient have P4CC services?: No  ADL Screening (condition at time of admission) Patient's cognitive ability adequate to safely complete daily activities?: Yes Is the patient deaf or have difficulty hearing?: No Does the patient have difficulty seeing, even when wearing glasses/contacts?: No Does the patient have difficulty concentrating, remembering, or making decisions?: No Patient able to express need for assistance with ADLs?: Yes Does the patient have difficulty dressing or bathing?: No Independently performs ADLs?: Yes (appropriate for developmental age) Does the patient have difficulty walking or climbing stairs?: No Weakness of Legs: None Weakness of Arms/Hands:  None  Home Assistive Devices/Equipment Home Assistive Devices/Equipment: None  Therapy Consults (therapy consults require a physician order) PT Evaluation Needed: No OT Evalulation Needed: No SLP Evaluation Needed: No Abuse/Neglect Assessment (Assessment to be complete while patient is alone) Physical Abuse: Denies Verbal Abuse: Denies Sexual Abuse: Denies Exploitation of patient/patient's resources: Denies Self-Neglect: Denies Values / Beliefs Cultural Requests During Hospitalization: None Spiritual Requests During Hospitalization: None Consults Spiritual Care Consult Needed: No Social Work Consult Needed: No Merchant navy officerAdvance Directives (For Healthcare) Does patient have an advance directive?: No Would patient like information on creating an advanced directive?: No - patient declined information    Additional Information 1:1 In Past 12 Months?: No CIRT Risk: No Elopement Risk: No Does patient have medical clearance?: Yes     Disposition:  Disposition Initial Assessment Completed for this Encounter: Yes (consulted with Fransisca KaufmannLaura Davis, NP) Disposition of Patient: Inpatient treatment program (TTS to seek placement)  Laddie AquasSamantha M Kiron Osmun 12/31/2015 3:16 PM

## 2015-12-31 NOTE — ED Notes (Signed)
Patient found to be mumbling to himself.

## 2015-12-31 NOTE — ED Notes (Signed)
Patient given a snack 

## 2015-12-31 NOTE — ED Notes (Signed)
[facility] need [facility], [facility] & will call when they are ready to complete TTS, family updated

## 2015-12-31 NOTE — ED Notes (Signed)
Ordered diet tray 

## 2015-12-31 NOTE — ED Notes (Signed)
TTS in process dad at bedside during assessment

## 2015-12-31 NOTE — ED Notes (Signed)
Belongings removed, given to pt father. Placed in scrubs, security to wand.

## 2015-12-31 NOTE — ED Notes (Signed)
Pt wanded by security. 

## 2015-12-31 NOTE — ED Notes (Signed)
[facility]. Advised working on it.

## 2015-12-31 NOTE — ED Notes (Signed)
Gave pt water, [facility].

## 2016-01-01 ENCOUNTER — Telehealth: Payer: Self-pay | Admitting: *Deleted

## 2016-01-01 NOTE — ED Notes (Signed)
Pt asking about where his breakfast is; informed pt that it has been ordered and should be here shortly; gave pt graham crackers and peanut butter to eat until breakfast arrives

## 2016-01-01 NOTE — ED Notes (Signed)
Not[facility]

## 2016-01-01 NOTE — Progress Notes (Signed)
Disposition CSW completed patient referrals to the following inpatient psych facilities:  [facility]  CSW will continue to follow patient for placement needs.  Seward SpeckLeo Areona Homer [facility] CenterCSW,LCAS Behavioral Health Disposition CSW [phone]

## 2016-01-01 NOTE — Telephone Encounter (Signed)
(+)  RPR, chart to EDP for evaluation

## 2016-01-02 ENCOUNTER — Telehealth (HOSPITAL_BASED_OUTPATIENT_CLINIC_OR_DEPARTMENT_OTHER): Payer: Self-pay

## 2016-02-04 DIAGNOSIS — F209 Schizophrenia, unspecified: Secondary | ICD-10-CM | POA: Insufficient documentation

## 2016-02-04 HISTORY — DX: Schizophrenia, unspecified: F20.9

## 2016-07-04 DIAGNOSIS — F061 Catatonic disorder due to known physiological condition: Secondary | ICD-10-CM | POA: Insufficient documentation

## 2016-07-04 HISTORY — DX: Catatonic disorder due to known physiological condition: F06.1

## 2016-11-20 ENCOUNTER — Ambulatory Visit: Payer: Medicaid Other | Admitting: Occupational Therapy

## 2016-11-20 ENCOUNTER — Ambulatory Visit: Payer: Medicaid Other | Attending: Psychiatry | Admitting: Rehabilitative and Restorative Service Providers"

## 2016-11-20 ENCOUNTER — Encounter: Payer: Self-pay | Admitting: Occupational Therapy

## 2016-11-20 DIAGNOSIS — R2689 Other abnormalities of gait and mobility: Secondary | ICD-10-CM | POA: Diagnosis not present

## 2016-11-20 DIAGNOSIS — R41844 Frontal lobe and executive function deficit: Secondary | ICD-10-CM | POA: Insufficient documentation

## 2016-11-20 DIAGNOSIS — R278 Other lack of coordination: Secondary | ICD-10-CM | POA: Insufficient documentation

## 2016-11-20 DIAGNOSIS — R2681 Unsteadiness on feet: Secondary | ICD-10-CM | POA: Insufficient documentation

## 2016-11-20 DIAGNOSIS — R471 Dysarthria and anarthria: Secondary | ICD-10-CM | POA: Insufficient documentation

## 2016-11-20 DIAGNOSIS — M6281 Muscle weakness (generalized): Secondary | ICD-10-CM | POA: Diagnosis present

## 2016-11-20 DIAGNOSIS — R41841 Cognitive communication deficit: Secondary | ICD-10-CM | POA: Insufficient documentation

## 2016-11-20 NOTE — Patient Instructions (Signed)
Heel Cord Stretch    Place one leg forward, bent, other leg behind and straight. Lean forward keeping back heel flat. Hold _30___ seconds while counting out loud. Repeat with other leg. Repeat __2__ times. Do __2__ sessions per day.  http://gt2.exer.us/511   Copyright  VHI. All rights reserved.   Heel Raise: Bilateral (Standing)    Rise on balls of feet. Repeat 10 times per set.  Do __2__ sessions per day.  http://orth.exer.us/38   Copyright  VHI. All rights reserved.   Chair Sitting    Sit at edge of seat, spine straight, one leg extended. Put a hand on each thigh and bend forward from the hip, keeping spine straight.  Support upper body with other arm. Hold _30__ seconds. Repeat __2_ times per session. Do _2__ sessions per day.  Copyright  VHI. All rights reserved.

## 2016-11-20 NOTE — Therapy (Signed)
[facility] [address] [place], [place], [address] Phone: [phone]   Fax:  [phone]  Physical Therapy Evaluation  Patient Details  Name: Eric Stephens MRN: [id number] Date of Birth: [birth year] Referring Provider: Satira Mccallum, MD  Encounter Date: 11/20/2016      PT End of Session - 11/20/16 1518    Visit Number 1   Number of Visits 8   Date for PT Re-Evaluation 12/20/16   Authorization Type self pay/medpay   PT Start Time 1148   PT Stop Time 1230   PT Time Calculation (min) 42 min   Activity Tolerance Patient tolerated treatment well;Other (comment)  PT instructed family in exercise for improved carryover      Past Medical History:  Diagnosis Date  . HIV (human immunodeficiency virus infection) (HCC)   . Syphilis     Past Surgical History:  Procedure Laterality Date  . WRIST SURGERY      There were no vitals filed for this visit.       Subjective Assessment - 11/20/16 1155    Subjective The patient presents to [facility] rehab today s/p neurosyphillis, also with tardive dyskinesia and resolved catatonia.  PT ordered to address gait impairments.     Patient Stated Goals Patient unable to answer.   Currently in Pain? No/denies            [facility] PT Assessment - 11/20/16 1157      Assessment   Medical Diagnosis neurosyphylis, tardive dyskinesia, resolved catatonia   Referring Provider Satira Mccallum, MD   Onset Date/Surgical Date 02/03/16   Prior Therapy therapy in IP rehab facility- d/c on thursday 11/16/16     Precautions   Precautions Fall     Restrictions   Weight Bearing Restrictions No     Balance Screen   Has the patient fallen in the past 6 months No   Has the patient had a decrease in activity level because of a fear of falling?  Yes  due to current diagnosis   Is the patient reluctant to leave their home because of a fear of falling?  No     Home Public house manager residence   Living Arrangements Parent   Type of Home House   Home Access Stairs to enter  front Microbiologist of Steps 2   Entrance Stairs-Rails Right   Home Layout One level   Home Equipment None     Prior Function   Level of Independence Independent   Vocation On disability  used to work in [employer] and for [employer]   Leisure reading, playing cards, play, watching sports     Cognition   Overall Cognitive Status Impaired/Different from baseline     Observation/Other Assessments   Focus on Therapeutic Outcomes (FOTO)  59%   Other Surveys  Other Surveys   Neuro Quality of Life  48.9% N QOL LE     Sensation   Light Touch Appears Intact     Coordination   Gross Motor Movements are Fluid and Coordinated No   Coordination and Movement Description patient has persistent rocking during evaluation; he has dec'd coordination with RAM into pronation/supination but improves with repetition.      Tone   Assessment Location --     ROM / Strength   AROM / PROM / Strength AROM;Strength     AROM   Overall AROM  Within functional limits for tasks performed  Overall AROM Comments limited shoulder range noted with reaching overhead; also noted tightness in L heel cord with difficulty standing and raising toes     Strength   Overall Strength Comments Patient has mild strength deficit noted on L LE with 4/5 knee extension, 5/5 other muscle groups (hip flexion, knee flexion,).  Ankle DF L LE mild weakness.     Transfers   Transfers Sit to Stand   Five time sit to stand comments  13.28 seconds     Ambulation/Gait   Ambulation/Gait Yes   Ambulation/Gait Assistance 6: Modified independent (Device/Increase time)  slowed pace   Ambulation Distance (Feet) 200 Feet   Assistive device None   Gait Pattern Decreased stride length;Decreased arm swing - right;Decreased arm swing - left;Step-through pattern;Decreased dorsiflexion - left   Gait velocity 2.48 ft/sec    Stairs Yes   Stairs Assistance 5: Supervision   Stair Management Technique One rail Right;Alternating pattern   Number of Stairs 4     Standardized Balance Assessment   Standardized Balance Assessment Berg Balance Test     Berg Balance Test   Sit to Stand Able to stand without using hands and stabilize independently   Standing Unsupported Able to stand safely 2 minutes   Sitting with Back Unsupported but Feet Supported on Floor or Stool Able to sit safely and securely 2 minutes   Stand to Sit Sits safely with minimal use of hands   Transfers Able to transfer safely, minor use of hands   Standing Unsupported with Eyes Closed Able to stand 10 seconds safely   Standing Ubsupported with Feet Together Able to place feet together independently and stand 1 minute safely   From Standing, Reach Forward with Outstretched Arm Can reach forward >12 cm safely (5")   From Standing Position, Pick up Object from Floor Able to pick up shoe safely and easily   From Standing Position, Turn to Look Behind Over each Shoulder Looks behind one side only/other side shows less weight shift   Turn 360 Degrees Able to turn 360 degrees safely but slowly   Standing Unsupported, Alternately Place Feet on Step/Stool Able to complete 4 steps without aid or supervision   Standing Unsupported, One Foot in Front Able to take small step independently and hold 30 seconds   Standing on One Leg Tries to lift leg/unable to hold 3 seconds but remains standing independently   Total Score 45   Berg comment: 45/56             Objective measurements completed on examination: See above findings.          [facility] Adult PT Treatment/Exercise - 11/20/16 1157      Exercises   Exercises Other Exercises   Other Exercises  Seated hamstring stretches x 1 rep each side, standing heel raises x 10 reps, standing heel cord stretch.                 PT Education - 11/20/16 1221    Education provided Yes   Education  Details HEP: hamstring stretch, heel raises, heel cord stretch   Person(s) Educated Patient   Methods Explanation;Demonstration;Handout   Comprehension Verbalized understanding;Returned demonstration             PT Long Term Goals - 11/20/16 1520      PT LONG TERM GOAL #1   Title The patient will be able to perform HEP with caregiver assistance. TARGET DATE ALL LTGS IS 12/19/2016   Time 4  Period Weeks     PT LONG TERM GOAL #2   Title The patient will improve Berg from 45/56 to > or equal to 50/56 to demo improving high level balance control.   Time 4   Period Weeks     PT LONG TERM GOAL #3   Title The patient will improve gait speed from 2.48 ft/sec to > or equal to 2.9 ft/sec to demo improving functional mobility.   Time 4   Period Weeks     PT LONG TERM GOAL #4   Title The patient will improve functional status score from 59% to > or equal to 70% to demo improved self perception of mobility.   Time 4   Period Weeks                Plan - 11/20/16 1523    Clinical Impression Statement The patient is a 36 year old male presenting to outpatient physical therapy after prolonged hospitalization for neurosyphyllis, tardive dyskinesia, and resolved catatonia.  Patient has decreased high level balance, decreased gait speed, muscle tightness L LE, mild weakness L LE, and diminished coordination.     History and Personal Factors relevant to plan of care: Living with mom/dad, unable to work, cognitive changes, change in life roles   Clinical Presentation Stable   Clinical Decision Making Low   Rehab Potential Good   Clinical Impairments Affecting Rehab Potential cognition   PT Frequency 2x / week   PT Duration 4 weeks   PT Treatment/Interventions ADLs/Self Care Home Management;Therapeutic exercise;Therapeutic activities;Balance training;Neuromuscular re-education;Gait training;Patient/family education;Stair training;Functional mobility training;Manual techniques   PT Next  Visit Plan Check HEP, high level balance (single limb, tandem activities, multi-sensory), community gait   Consulted and Agree with Plan of Care Patient      Patient will benefit from skilled therapeutic intervention in order to improve the following deficits and impairments:  Abnormal gait, Decreased coordination, Difficulty walking, Decreased endurance, Decreased balance, Decreased strength, Impaired sensation, Impaired flexibility, Decreased activity tolerance  Visit Diagnosis: Other abnormalities of gait and mobility  Muscle weakness (generalized)     Problem List There are no active problems to display for this patient.   Brigg Cape, PT 11/20/2016, 3:27 PM  [facility] [address] [place], [place], [address] Phone: [phone]   Fax:  [phone]  Name: Eric Stephens MRN: [id number] Date of Birth: 11-16-1980

## 2016-11-20 NOTE — Therapy (Addendum)
[facility] [address] [place], [place], [address] Phone: [phone]   Fax:  [phone]  Occupational Therapy Evaluation  Patient Details  Name: Eric Stephens MRN: [id number] Date of Birth: 11/27/1980 Referring Provider: Dr. Satira Mccallum  Encounter Date: 11/20/2016      OT End of Session - 11/20/16 1407    Visit Number 1   Number of Visits 8   Date for OT Re-Evaluation 12/18/16   Authorization Type Medpay   OT Start Time 1103   OT Stop Time 1144   OT Time Calculation (min) 41 min   Activity Tolerance Patient tolerated treatment well      Past Medical History:  Diagnosis Date  . HIV (human immunodeficiency virus infection) (HCC)   . Syphilis     Past Surgical History:  Procedure Laterality Date  . WRIST SURGERY      There were no vitals filed for this visit.      Subjective Assessment - 11/20/16 1110    Subjective  [birth year]   Pertinent History Pt with neurosyphilis and HIV   Currently in Pain? No/denies           [facility] OT Assessment - 11/20/16 0001      Assessment   Diagnosis Neurosyphilis, HIV   Referring Provider Dr. Satira Mccallum   Prior Therapy PT, OT and ST     Precautions   Precautions Fall     Restrictions   Weight Bearing Restrictions No     Balance Screen   Has the patient fallen in the past 6 months No  per pt - no family member present     Home  Environment   Family/patient expects to be discharged to: Private residence   Living Arrangements Parent  father and mother   Type of Home House   Home Layout One level   Bathroom Shower/Tub Tub/Shower unit   [employer] Standard   Additional Comments Pt stands in the shower;  has grab bars.  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      ADL   Eating/Feeding Independent   Grooming Independent   Upper Body Bathing Modified independent   Lower Body Bathing Modified independent   Upper Body Dressing Independent   Lower Body Dressing Independent   Toilet Transfer Independent   Toileting - Clothing Manipulation Independent   Toileting -  Hygiene Independent   Tub/Shower Transfer Modified independent   ADL comments Pt and father reports that no one supervises in bathrrom      IADL   Shopping Needs to be accompanied on any shopping trip   Light Housekeeping Performs light daily tasks such as dishwashing, bed making  per pt report   Meal Prep Needs to have meals prepared and served   [employer] on family or friends for transportation   Medication Management Is not capable of dispensing or managing own medication   Financial Management Requires assistance     Mobility   Mobility Status Needs assist   Mobility Status Comments supervision in the community     Written Expression   Dominant Hand Right   Handwriting 100% legible  for name;pt reports hand writing is the same as before  Vision - History   Baseline Vision Wears glasses all the time   Additional Comments Pt denies any visual changes.       Vision Assessment   Eye Alignment Within Functional Limits   Ocular Range of Motion Within Functional Limits   Tracking/Visual Pursuits Able to track stimulus in all quads without difficulty   Comment Pt has tardive dyskinesia causing excessive blinking.      Activity Tolerance   Activity Tolerance Tolerates 30 min activity with muliple rests   Activity Tolerance Comments Pt reports he does not rest during the day and sleeps well at night.     Cognition   Overall Cognitive Status Impaired/Different from baseline   Mini Mental State Exam  MOCA = 18/30 ; pt with ST memory deficts, visual spatial deficts,language deficits.     Attention Alternating   Alternating  Attention Impaired   Alternating Attention Impairment Verbal basic;Functional basic   Memory Impaired   Awareness Impaired   Awareness Impairment Intellectual impairment   Problem Solving Impaired   Executive Function Reasoning;Organizing;Decision Making;Self Monitoring;Self Correcting   Behaviors Restless;Poor frustration tolerance  pt also with tardive dyskinesia,      Sensation   Light Touch Appears Intact   Hot/Cold Appears Intact   Proprioception Appears Intact     Coordination   Gross Motor Movements are Fluid and Coordinated Yes   9 Hole Peg Test Right;Left   Right 9 Hole Peg Test 24.38   Left 9 Hole Peg Test 57.80     Tone   Assessment Location Right Upper Extremity;Left Upper Extremity     ROM / Strength   AROM / PROM / Strength AROM;PROM;Strength     AROM   Overall AROM  Within functional limits for tasks performed  except L hand   Overall AROM Comments Middle finger on L hand lacks full extension with laxity in PIP and DIP. Pt states it was like this before - no family to verify     PROM   Overall PROM  Within functional limits for tasks performed     Strength   Overall Strength Within functional limits for tasks performed   Overall Strength Comments BUE's     Hand Function   Right Hand Gross Grasp Functional   Right Hand Grip (lbs) 60   Left Hand Gross Grasp Impaired   Left Hand Grip (lbs) 20     RUE Tone   RUE Tone Within Functional Limits  RUE with stiffness not true tone     LUE Tone   LUE Tone Other (Comment)  LUE with stiffness not true tone                              OT Long Term Goals - 11/20/16 1300      OT LONG TERM GOAL #1   Title Pt and family will be mod I with HEP to address coordination/grip strength in L hand - 12/18/2016   Status New (basline= dependent)     OT LONG TERM GOAL #2   Title Pt will demonstrate improved grip strength by 5 pounds to assist with functional tasks (basline LUE= 20 pounds)   Status  New      OT LONG TERM GOAL #3   Title Pt will demonstrate improved coordination as evidenced by decreasing time on 9 hole peg by at least 8 seconds to assist with functional tasks (baseline LUE= 57.80)   Status New  OT LONG TERM GOAL #4   Title Pt will be supevision for simple hot familiar meal prep   Status New (basline = dependent     OT LONG TERM GOAL #5   Title Pt and family will verbalize understanding of voc rehab services.    Status New (basline = unable)     OT LONG TERM GOAL #6   Title Pt will need no more than min vc's for simple problem solving during functional, familiar activities.    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 Pt admitted to hospital on 01/02/2017.  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 Pt was discharged home 11/17/2016.  Pt presents with the following deficts that impact his ability to complete IADL, leisure and work tasks:  impaired cognition, decreased L grip strength, decreased LUE coordination, decreased balance, decreased awareness of deficits.  Pt will beneift from skilled OT to maximize independence.    Occupational Profile and client history currently impacting functional performance HIV, neurosyphillis   Occupational performance deficits (Please refer to evaluation for details): IADL's;Work;Leisure   Rehab Potential Fair   Current Impairments/barriers affecting progress: severity of deficits, length of onset of disease process   OT Frequency 2x / week   OT Duration 4 weeks   OT Treatment/Interventions Self-care/ADL training;Electrical Stimulation;Therapeutic exercise;Neuromuscular education;DME and/or AE instruction;Manual Therapy;Functional Mobility Training;Splinting;Therapeutic  activities;Cognitive remediation/compensation;Patient/family education;Balance training   Clinical Decision Making Limited treatment options, no task modification necessary   Consulted and Agree with Plan of Care Patient;Family member/caregiver   Family Member Consulted father      Patient will benefit from skilled therapeutic intervention in order to improve the following deficits and impairments:  Decreased balance, Decreased cognition, Decreased coordination, Decreased safety awareness, Decreased mobility, Decreased strength, Difficulty walking, Impaired UE functional use  Visit Diagnosis: Muscle weakness (generalized) - Plan: Ot plan of care cert/re-cert  Other lack of coordination - Plan: Ot plan of care cert/re-cert  Unsteadiness on feet - Plan: Ot plan of care cert/re-cert  Frontal lobe and executive function deficit - Plan: Ot plan of care cert/re-cert    Problem List There are no active problems to display for this patient.   Norton Pastelulaski, Lavena Loretto Halliday, OTR/L 11/20/2016, 2:10 PM  [facility] [address] [place], [place], [address] Phone: [phone]   Fax:  [phone]  Name: Eric Stephens MRN: [id number] Date of Birth: 02/11/1981

## 2016-11-23 ENCOUNTER — Ambulatory Visit: Payer: Medicaid Other | Admitting: Speech Pathology

## 2016-11-23 DIAGNOSIS — R471 Dysarthria and anarthria: Secondary | ICD-10-CM

## 2016-11-23 DIAGNOSIS — R2689 Other abnormalities of gait and mobility: Secondary | ICD-10-CM | POA: Diagnosis not present

## 2016-11-23 DIAGNOSIS — R41841 Cognitive communication deficit: Secondary | ICD-10-CM

## 2016-11-24 NOTE — Therapy (Signed)
[facility] [address] [place], [place], [address] Phone: [phone]   Fax:  [phone]  Speech Language Pathology Evaluation  Patient Details  Name: Eric BrunsLamont E Stephens MRN: [id number] Date of Birth: 10/19/1980 Referring Provider: Satira MccallumZachary Lane, MD  Encounter Date: 11/23/2016      End of Session - 11/23/16 0935    Visit Number 1   Number of Visits 4   Date for SLP Re-Evaluation 01/19/17   Authorization Type Medicaid, request 1 eval and 3 visits   Authorization - Visit Number 1   Authorization - Number of Visits 4   SLP Start Time 0845   SLP Stop Time  0935   SLP Time Calculation (min) 50 min   Activity Tolerance Patient tolerated treatment well      Past Medical History:  Diagnosis Date  . HIV (human immunodeficiency virus infection) (HCC)   . Syphilis     Past Surgical History:  Procedure Laterality Date  . WRIST SURGERY      There were no vitals filed for this visit.      Subjective Assessment - 11/23/16 0851    Subjective "I just discharged from the hospital... I was doing it there... my doctor set it up for me." (re: why here for ST)   Currently in Pain? 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     Subjective   Subjective --   Patient/Family Stated Goal speak more clearly     General Information   HPI Pt with neurosyphilis and HIV; see clinical impressions for additional details   Behavioral/Cognition alert, cooperative   Mobility Status ambulated to session     Prior Functional Status   Cognitive/Linguistic Baseline Within functional limits   Type of Home House    Lives With Family   Available Support Available 24 hours/day   Education 12th grade   Vocation On disability     Cognition   Overall Cognitive  Status Impaired/Different from baseline   Area of Impairment Memory;Attention;Problem solving;Awareness   Current Attention Level Sustained   Attention Comments poor attention to testing instructions, impulsive   Memory Decreased short-term memory   Memory Comments design memory impaired   Awareness Intellectual   Awareness Comments denies cognitive deficits   Problem Solving Difficulty sequencing   Problem Solving Comments trailmaking, mazes errors   Attention Selective   Selective Attention Impaired   Selective Attention Impairment Verbal basic;Functional basic   Alternating Attention Impaired   Alternating Attention Impairment Verbal basic;Functional basic   Memory Impaired   Memory Impairment Decreased recall of new information   Awareness Impaired   Awareness Impairment Intellectual impairment   Problem Solving Impaired   Problem Solving Impairment Verbal basic;Functional basic   Executive Function Reasoning;Organizing;Decision Making;Self Monitoring;Self Correcting   Reasoning Impaired   Reasoning Impairment Verbal basic;Functional basic   Organizing Impaired   Organizing Impairment Verbal basic;Functional basic   Decision Making Impaired   Decision Making Impairment Verbal basic;Functional basic   Self Monitoring Impaired   Self Monitoring Impairment Verbal basic;Functional basic   Self Correcting Impaired   Self Correcting Impairment Verbal basic;Functional basic   Behaviors Impulsive     Auditory Comprehension   Overall Auditory Comprehension Appears within functional limits for tasks  assessed     Visual Recognition/Discrimination   Discrimination Within Function Limits     Reading Comprehension   Reading Status Not tested     Expression   Primary Mode of Expression Verbal     Verbal Expression   Overall Verbal Expression Appears within functional limits for tasks assessed     Written Expression   Dominant Hand Right   Written Expression Not tested      Oral Motor/Sensory Function   Overall Oral Motor/Sensory Function Appears within functional limits for tasks assessed     Motor Speech   Overall Motor Speech Impaired   Respiration Within functional limits   Phonation Other (comment)  WFL   Resonance Within functional limits   Articulation Impaired  Pt with dysarthria, stuttering   Level of Impairment Phrase   Intelligibility Intelligibility reduced   Word 75-100% accurate   Phrase 75-100% accurate   Sentence 75-100% accurate   Conversation 75-100% accurate   Motor Planning Witnin functional limits   Motor Speech Errors Aware     Standardized Assessments   Standardized Assessments  Cognitive Linguistic Quick Test     Cognitive Linguistic Quick Test (Ages 18-69)   Attention Mild   Memory Moderate   Executive Function Mild   Language WNL   Visuospatial Skills Mild   Severity Rating Total 15   Composite Severity Rating 12.6                         SLP Education - 11/24/16 1459    Education provided Yes   Education Details Plan of care, cognitive activities for home, attention and memory strategies   Person(s) Educated Patient;Parent(s)   Methods Explanation;Handout   Comprehension Verbalized understanding;Need further instruction            SLP Long Term Goals - 11/24/16 1522      SLP LONG TERM GOAL #1   Title Pt will tell SLP 2 deficits with rare min A.   Baseline Pt denies cognitive deficits.   Time 5   Period Weeks   Status New     SLP LONG TERM GOAL #2   Title Pt will tell SLP 2 compensatory strategies for attention/memory.   Baseline unable to state   Time 5   Period Weeks   Status New     SLP LONG TERM GOAL #3   Title Patient will demo HEP for dysarthria/intelligibility.   Baseline 75% intelligibility in conversation   Time 5   Period Weeks   Status New     SLP LONG TERM GOAL #4   Title Pt and family will verbalize understanding of voc rehab services.   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 Pt admitted to hospital on 01/02/2017.  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 Pt was discharged home 11/17/2016. Pt received PT, OT and ST during hospital admission. Pt with resulting tardive dyskinesia, stuttering, dysarthria and cognitive deficits. He presents today with moderate impairment in memory, mild impairments in attention, executive functions, and visuospatial skills. Administered Cognitive Linguistic Quick Test; pt with overall severity rating of mild. During testing, he demonstrated impulsivity and reduced attention to  testing instructions. He completed tasks rapidly without self-monitoring or awareness of errors. Based on the above deficits and hospital admission, pt should qualify for 1 evaluation and 3 treatments. Recommend skilled ST to address intelligibility, maximize cognitive functioning, reduce caregiver burden and improve quality of life.   Speech Therapy Frequency 1x /week   Duration --  5 weeks, for 3 additional visits   Treatment/Interventions Functional tasks;SLP instruction and feedback;Compensatory strategies;Patient/family education;Internal/external aids;Cognitive reorganization   Potential to Achieve Goals Good   Potential Considerations Ability to learn/carryover information;Financial resources   SLP Home Exercise Plan Cognitive activities for home   Consulted and Agree with Plan of Care Patient;Family member/caregiver   Family Member Consulted father, not present for the evaluation, SLP discussed POC briefly upon pt's exit      Patient will benefit from skilled therapeutic intervention in order to improve the following deficits and impairments:   Cognitive communication deficit  Dysarthria  and anarthria      G-Codes - 26-Nov-2016 0930    Functional Assessment Tool Used noms   Functional Limitations Memory   Memory Current Status (Z6109) At least 20 percent but less than 40 percent impaired, limited or restricted   Memory Goal Status (U0454) At least 20 percent but less than 40 percent impaired, limited or restricted      Problem List There are no active problems to display for this patient.  Rondel Baton, MS, CCC-SLP Speech-Language Pathologist  Arlana Lindau 11/24/2016, 3:29 PM  [facility] [address] [place], [place], [address] Phone: [phone]   Fax:  [phone]  Name: Eric Stephens MRN: [id number] Date of Birth: February 19, 1981

## 2016-11-27 ENCOUNTER — Ambulatory Visit: Payer: Medicaid Other | Admitting: Physical Therapy

## 2016-11-27 ENCOUNTER — Encounter: Payer: Medicaid Other | Admitting: Occupational Therapy

## 2016-11-28 ENCOUNTER — Encounter: Payer: Self-pay | Admitting: Rehabilitative and Restorative Service Providers"

## 2016-11-28 NOTE — Therapy (Signed)
[facility] [address] [place], [place], [address] Phone: [phone]   Fax:  [phone]  Patient Details  Name: Eric Stephens MRN: [id number] Date of Birth: 01/09/1981  Encounter Date: last encounter 11/20/16  The patient was evaluated in PT on 11/20/16 and will not have further PT scheduled at this time due to limited medicaid coverage.  He qualifies for 1 eval + 3 tx visits/year for this diagnosis code and his greatest need at our clinic is for Speech Therapy.  The patient is unable topay out of pocket expenses at this time, there fore will not be seen for treatment.    Mannix Kroeker 11/28/2016, 10:03 AM  [facility] [address] [place], [place], [address] Phone: [phone]   Fax:  [phone]

## 2016-11-30 ENCOUNTER — Encounter: Payer: Medicaid Other | Admitting: Occupational Therapy

## 2016-11-30 ENCOUNTER — Ambulatory Visit: Payer: Medicaid Other | Admitting: Physical Therapy

## 2016-12-01 ENCOUNTER — Ambulatory Visit: Payer: Medicaid Other | Admitting: Rehabilitative and Restorative Service Providers"

## 2016-12-05 ENCOUNTER — Ambulatory Visit: Payer: Medicaid Other

## 2016-12-05 ENCOUNTER — Encounter: Payer: Medicaid Other | Admitting: Occupational Therapy

## 2016-12-05 ENCOUNTER — Ambulatory Visit: Payer: Medicaid Other | Admitting: Physical Therapy

## 2016-12-05 DIAGNOSIS — R2689 Other abnormalities of gait and mobility: Secondary | ICD-10-CM | POA: Diagnosis not present

## 2016-12-05 DIAGNOSIS — R471 Dysarthria and anarthria: Secondary | ICD-10-CM

## 2016-12-05 DIAGNOSIS — R41841 Cognitive communication deficit: Secondary | ICD-10-CM

## 2016-12-05 NOTE — Patient Instructions (Signed)
  Please complete the assigned speech therapy homework prior to your next session and return it to the speech therapist at your next visit.  
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
## 2016-12-07 ENCOUNTER — Ambulatory Visit: Payer: Medicaid Other | Admitting: Physical Therapy

## 2016-12-08 ENCOUNTER — Ambulatory Visit: Payer: Medicaid Other | Admitting: Rehabilitative and Restorative Service Providers"

## 2016-12-11 ENCOUNTER — Ambulatory Visit: Payer: Medicaid Other | Admitting: Physical Therapy

## 2016-12-11 ENCOUNTER — Encounter: Payer: Medicaid Other | Admitting: Occupational Therapy

## 2016-12-12 ENCOUNTER — Ambulatory Visit: Payer: Medicaid Other | Admitting: Physical Therapy

## 2016-12-12 ENCOUNTER — Encounter: Payer: Medicaid Other | Admitting: Occupational Therapy

## 2016-12-19 ENCOUNTER — Ambulatory Visit: Payer: Medicaid Other | Admitting: Rehabilitative and Restorative Service Providers"

## 2016-12-19 ENCOUNTER — Encounter: Payer: Medicaid Other | Admitting: Occupational Therapy

## 2016-12-19 ENCOUNTER — Ambulatory Visit: Payer: Medicaid Other | Attending: Psychiatry

## 2016-12-19 DIAGNOSIS — R41841 Cognitive communication deficit: Secondary | ICD-10-CM | POA: Diagnosis present

## 2016-12-19 DIAGNOSIS — R471 Dysarthria and anarthria: Secondary | ICD-10-CM

## 2016-12-19 NOTE — Patient Instructions (Addendum)
   You may want to think about contacting vocational rehabilitation. They may be able to help you find a job that would fit with your strengths now. There's a chance that if the job includes speaking with people, they may pay for more speech therapy sessions than Medicaid will pay for (next one is our last one).  [place]  [phone]     [address] [place] [place] [phone]    Please complete the assigned speech therapy homework prior to your next session and return it to the speech therapist at your next visit.

## 2016-12-20 DIAGNOSIS — F0281 Dementia in other diseases classified elsewhere with behavioral disturbance: Secondary | ICD-10-CM | POA: Insufficient documentation

## 2016-12-20 DIAGNOSIS — F02818 Dementia in other diseases classified elsewhere, unspecified severity, with other behavioral disturbance: Secondary | ICD-10-CM | POA: Insufficient documentation

## 2016-12-21 ENCOUNTER — Encounter: Payer: Medicaid Other | Admitting: Occupational Therapy

## 2016-12-21 ENCOUNTER — Ambulatory Visit: Payer: Medicaid Other | Admitting: Rehabilitative and Restorative Service Providers"

## 2016-12-21 NOTE — Therapy (Signed)
[facility] [address] [place], [place], [address] Phone: [phone]   Fax:  [phone]  Speech Language Pathology Treatment  Patient Details  Name: Eric Stephens MRN: [id number] Date of Birth: 10-18-1980 Referring Provider: Satira Mccallum, MD  Encounter Date: 12/19/2016      End of Session - 12/21/16 0857    Visit Number 3   Number of Visits 4   Date for SLP Re-Evaluation 01/19/17   Authorization Type Medicaid, request 1 eval and 3 visits   Authorization Time Period 12-04-16 to 01-14-17   Authorization - Visit Number 3   Authorization - Number of Visits 4   SLP Start Time 1104   SLP Stop Time  1146   SLP Time Calculation (min) 42 min   Activity Tolerance Patient tolerated treatment well      Past Medical History:  Diagnosis Date  . HIV (human immunodeficiency virus infection) (HCC)   . Syphilis     Past Surgical History:  Procedure Laterality Date  . WRIST SURGERY      There were no vitals filed for this visit.             ADULT SLP TREATMENT - 12/21/16 0001      General Information   Behavior/Cognition Distractible;Pleasant mood;Confused;Cooperative     Treatment Provided   Treatment provided Cognitive-Linquistic     Cognitive-Linquistic Treatment   Treatment focused on Dysarthria   Skilled Treatment SLP reviewed strategies for improved speech intelligibility, reiterated opening mouth for "clear" speech. In description tasks pt req' mod-max A occasionally, faded to min-mod A occasionally for speech intelligibility. No digital recorder was needed this session to incr pt's awareness however SLP used verbal notificaiton to pt when intelligilbity suffered on individual responses. This did not appear to consistently improve pt's intelligibility in the next subsequent response.     Assessment / Recommendations / Plan   Plan Continue with current plan of care     Progression Toward Goals    Progression toward goals Progressing toward goals          SLP Education - 12/21/16 0857    Education provided Yes   Education Details vocational rehab and how they can A pt   Person(s) Educated Patient   Methods Explanation   Comprehension Verbalized understanding            SLP Long Term Goals - 12/21/16 0858      SLP LONG TERM GOAL #1   Title Pt will tell SLP 2 deficits with rare min A.   Baseline Pt denies cognitive deficits.   Time 4   Period Weeks   Status On-going     SLP LONG TERM GOAL #2   Title Pt will tell SLP 2 compensatory strategies for attention/memory.   Baseline unable to state   Time 4   Period Weeks   Status On-going     SLP LONG TERM GOAL #3   Title Patient will demo HEP for dysarthria/intelligibility.   Baseline 75% intelligibility in conversation   Time 4   Period Weeks   Status On-going     SLP LONG TERM GOAL #4   Status Deferred  OT goal          Plan - 12/21/16 0857    Clinical Impression Statement SLP largely targeted pt's dysarthria through short phrase/sentence speech tasks. Skilled ST remains necessary for pt to improve these skills and reduce caregiver burden and improve QOL.   Speech Therapy Frequency  1x /week   Duration --  3 visits   Treatment/Interventions Functional tasks;SLP instruction and feedback;Compensatory strategies;Patient/family education;Internal/external aids;Cognitive reorganization   Potential to Achieve Goals Fair   Potential Considerations Ability to learn/carryover information;Financial resources      Patient will benefit from skilled therapeutic intervention in order to improve the following deficits and impairments:   Dysarthria and anarthria  Cognitive communication deficit    Problem List There are no active problems to display for this patient.   Eric Mosherarl [name], MS, CCC-SLP 12/21/2016, 8:59 AM  [facility] [address] [place], [place], [address] Phone: [phone]   Fax:  [phone]   Name: Eric Stephens MRN: [id number] Date of Birth: 04/28/1981

## 2016-12-25 ENCOUNTER — Encounter: Payer: Medicaid Other | Admitting: Occupational Therapy

## 2016-12-25 ENCOUNTER — Ambulatory Visit: Payer: Medicaid Other | Admitting: Physical Therapy

## 2016-12-28 ENCOUNTER — Ambulatory Visit: Payer: Medicaid Other

## 2016-12-28 ENCOUNTER — Ambulatory Visit: Payer: Medicaid Other | Admitting: Rehabilitative and Restorative Service Providers"

## 2016-12-28 ENCOUNTER — Encounter: Payer: Medicaid Other | Admitting: Occupational Therapy

## 2017-01-01 ENCOUNTER — Encounter: Payer: Medicaid Other | Admitting: Occupational Therapy

## 2017-01-01 ENCOUNTER — Ambulatory Visit: Payer: Medicaid Other | Admitting: Speech Pathology

## 2017-01-01 DIAGNOSIS — R41841 Cognitive communication deficit: Secondary | ICD-10-CM

## 2017-01-01 DIAGNOSIS — R471 Dysarthria and anarthria: Secondary | ICD-10-CM

## 2017-01-01 NOTE — Patient Instructions (Signed)
Every day homework: Read these phrases and sentences in front of a mirror.  Mouth: OPEN WIDE Go slowly and clearly. One sentence at a time.  1. I'm blessed and highly favored.  2. Thank you, ma'am.  3. Thank you sir.  4. You're welcome.  5. I miss you.  6. I love you.  7. Glenetta BorgLebron James went to the [employer].  8. [address] [place] lost against the [employer].  9. I like the [place] teams- the Bulls, the Cubs and the Bears.  10. I have a daughter named Miracle.  11. I like watching Marisue HumbleSanford and Son and Marshell GarfinkelSteve Harvey on TV.

## 2017-01-01 NOTE — Therapy (Signed)
[facility] [address] [place], [place], [address] Phone: [phone]   Fax:  [phone]  Speech Language Pathology Treatment and Discharge Summary  Patient Details  Name: Eric Stephens MRN: [id number] Date of Birth: [birth year] Referring Provider: Maryelizabeth Rowan, MD  Encounter Date: 01/01/2017      End of Session - 01/01/17 1055    Visit Number 4   Number of Visits 4   Date for SLP Re-Evaluation 01/19/17   Authorization Type Medicaid, request 1 eval and 3 visits   Authorization Time Period 12-04-16 to 01-14-17   Authorization - Visit Number 4   Authorization - Number of Visits 4   SLP Start Time 9774   SLP Stop Time  1100   SLP Time Calculation (min) 39 min   Activity Tolerance Patient tolerated treatment well      Past Medical History:  Diagnosis Date  . HIV (human immunodeficiency virus infection) ([place])   . Syphilis     Past Surgical History:  Procedure Laterality Date  . WRIST SURGERY      There were no vitals filed for this visit.      Subjective Assessment - 01/01/17 1024    Subjective "I've been practicing speaking slowly and clearly."   Currently in Pain? No/denies               ADULT SLP TREATMENT - 01/01/17 0001      General Information   Behavior/Cognition Distractible;Pleasant mood;Confused;Cooperative   Patient Positioning Upright in chair   Oral care provided N/A     Treatment Provided   Treatment provided Cognitive-Linquistic     Pain Assessment   Pain Assessment No/denies pain     Cognitive-Linquistic Treatment   Treatment focused on Dysarthria   Skilled Treatment SLP facilitated pt recall of strategies for improved speech intelligibility. Pt lists these as "speak slowly and clearly," and "open my mouth wider." Today's session focused on intelligibility, as pt continues to deny cognitive deficits. During dysarthria treatment, informally targeted immediate recall with  simple 4-5 word lists. Pt with approx 75% accuracy in immediate recall of words. Initially required mod A for mouth opening and cues to use visual feedback (mirror), SLP faded cues to occasional min A. SLP facilitated short simple conversational responses; pt required consistent min A to re-state using slowed speech and overarticulation. Assisted pt with writing functional phrases and sentences to add to HEP for dysarthria for maintenance/carryover. SLP encouraged use of mirror which improved pt's self-monitoring of mouth opening. Pt with 90% accuracy and occasional min A to slow rate.     Assessment / Recommendations / Plan   Plan Discharge SLP treatment due to (comment)  no remaining visits covered by insurance, pt agrees with d/c     Progression Toward Goals   Progression toward goals --  Goals partially met, pt agrees to discharge          SLP Education - 01/01/17 1848    Education provided Yes   Education Details ongoing HEP for dysarthria, use of dysarthria strategies when on the phone with his daughter   Person(s) Educated Patient   Methods Explanation   Comprehension Verbalized understanding            SLP Long Term Goals - 01/01/17 1026      SLP LONG TERM GOAL #1   Title Pt will tell SLP 2 deficits with rare min A.   Baseline Pt denies cognitive deficits.   Time 3  Period Weeks   Status Not Met     SLP LONG TERM GOAL #2   Title Pt will tell SLP 2 compensatory strategies for attention/memory.   Baseline unable to state   Time 3   Period Weeks   Status Not Met     SLP LONG TERM GOAL #3   Title Patient will demo HEP for dysarthria/intelligibility.   Baseline 75% intelligibility in conversation   Time 3   Period Weeks   Status Achieved     SLP LONG TERM GOAL #4   Title Pt and family will verbalize understanding of voc rehab services.   Status Deferred          Plan - 01/01/17 1855    Clinical Impression Statement Pt seen for final visit approved by  Medicaid. Pt has not met cognitive goals for awareness of deficits or memory strategies. Today he demo'd his HEP for dysarthria with initial min A fading to supervision cues for functional phrases and sentences. Education completed today re: vocational rehab services as well as home program for dysarthria to facilitate carryover and maintenance of strategies trained in therapy. Pt discharged from therapy today; pt in agreement.   Speech Therapy Frequency --  d/c   Duration Other (comment)  d/c   Treatment/Interventions Functional tasks;SLP instruction and feedback;Compensatory strategies;Patient/family education;Internal/external aids;Cognitive reorganization   Potential to Achieve Goals Fair   Potential Considerations Ability to learn/carryover information;Financial resources   SLP Home Exercise Plan functional phrases added to HEP for dysarthria   Consulted and Agree with Plan of Care Patient;Family member/caregiver      Patient will benefit from skilled therapeutic intervention in order to improve the following deficits and impairments:   Cognitive communication deficit  Dysarthria and anarthria    Problem List There are no active problems to display for this patient.  SPEECH THERAPY DISCHARGE SUMMARY  Visits from Start of Care: 4  Current functional level related to goals / functional outcomes: In simple structured conversation with familiar communication partners, the individual can produce simple words and phrases intelligibly though he occasionally requires min-moderate cues and accuracy may vary.    Remaining deficits: Pt continues to display cognitive-linguistic and intelligibility deficits.   Education / Equipment: Patient educated in activities for home including dysarthria Herbalist, cognitive activities and community resources.  Plan: Patient agrees to discharge.  Patient goals were partially met. Patient is being discharged due to not returning since the  last visit.  ?????      Aliene Altes 01/01/2017, 6:58 PM   Deneise Lever, MS, [facility] [address] [place], [place], [address] Phone: [phone]   Fax:  [phone]   Name: Eric Stephens MRN: [id number] Date of Birth: [birth year]

## 2017-01-04 ENCOUNTER — Encounter: Payer: Medicaid Other | Admitting: Speech Pathology

## 2017-01-04 ENCOUNTER — Encounter: Payer: Medicaid Other | Admitting: Occupational Therapy

## 2017-01-08 ENCOUNTER — Ambulatory Visit (INDEPENDENT_AMBULATORY_CARE_PROVIDER_SITE_OTHER): Payer: Medicaid Other | Admitting: Internal Medicine

## 2017-01-08 ENCOUNTER — Encounter: Payer: Self-pay | Admitting: Internal Medicine

## 2017-01-08 ENCOUNTER — Encounter: Payer: Medicaid Other | Admitting: Occupational Therapy

## 2017-01-08 ENCOUNTER — Encounter: Payer: Medicaid Other | Admitting: Speech Pathology

## 2017-01-08 DIAGNOSIS — R21 Rash and other nonspecific skin eruption: Secondary | ICD-10-CM | POA: Diagnosis present

## 2017-01-08 DIAGNOSIS — G4700 Insomnia, unspecified: Secondary | ICD-10-CM | POA: Diagnosis not present

## 2017-01-08 DIAGNOSIS — F419 Anxiety disorder, unspecified: Secondary | ICD-10-CM

## 2017-01-08 DIAGNOSIS — Z8619 Personal history of other infectious and parasitic diseases: Secondary | ICD-10-CM | POA: Insufficient documentation

## 2017-01-08 DIAGNOSIS — A523 Neurosyphilis, unspecified: Secondary | ICD-10-CM | POA: Diagnosis not present

## 2017-01-08 DIAGNOSIS — Z87891 Personal history of nicotine dependence: Secondary | ICD-10-CM

## 2017-01-08 NOTE — Patient Instructions (Signed)
[name],   It was a pleasure to meet you today   Please call and schedule a follow up appointment in the acute care clinic if your rash has not improved in 2 weeks   Schedule a follow up appointment with your primary care provider in 2 months

## 2017-01-08 NOTE — Progress Notes (Signed)
   CC: rash, establishing care   HPI: [name] is a 36 y.o. with PMH as listed below who presents to establish care with acute concern for a rash. Please see the assessment and plans for the status of the patient chronic medical problems.   Past Medical History:  Diagnosis Date  . Syphilis    Past Surgical History:  Procedure Laterality Date  . WRIST SURGERY     History reviewed. No pertinent family history.  Social History   Social History  . Marital status: Single    Spouse name: N/A  . Number of children: N/A  . Years of education: N/A   Occupational History  . Not on file.   Social History Main Topics  . Smoking status: Former Smoker    Packs/day: 0.50    Types: Cigarettes  . Smokeless tobacco: Not on file  . Alcohol use No  . Drug use: No  . Sexual activity: Yes   Other Topics Concern  . Not on file   Social History Narrative  . No narrative on file   Review of Systems:  Refer to history of present illness and assessment and plans for pertinent review of systems, all others reviewed and negative  Physical Exam:  Vitals:   01/08/17 0949  BP: (!) 103/55  Pulse: 66  Temp: 98.6 F (37 C)  TempSrc: Oral  SpO2: 100%  Weight: 249 lb 6.4 oz (113.1 kg)   Physical Exam  Constitutional: He is well-developed, well-nourished, and in no distress. No distress.  Cardiovascular: Normal rate and regular rhythm.   No murmur heard. Pulmonary/Chest: Effort normal and breath sounds normal. He has no wheezes. He has no rales.  Abdominal: Soft. Bowel sounds are normal. He exhibits no distension. There is no tenderness.  Neurological: Gait normal.  Skin: Skin is warm and dry. He is not diaphoretic.  Psychiatric: His mood appears anxious.   Assessment & Plan:   Rash  Describes itching rash over his buttocks which began two weeks ago. He describes the rash as looking like small spots but it is not painful. He will not let me see the rash does not feel  comfortable having a male physician look at the rash and agrees instead to monitor the rash at home with conservative management.  - Advised to avoid new detergents or soap and keep a food journal  - Trial of benadryl  - RTC in 2 weeks if the rash has not improved  Neurosyphillis  [facility]. He completed an uncomplicated 10 day course of IV penicillin and presenting symptom of catatonia resolved. Hospital discharge notes mention that if he develops catatonia or delusional statements he should be urgently reevaluated.   Healthcare maintenance  - A1c today, lipid panel today   Anxiety  Patient reports this is stable today. This is managed by outside behavioral health with ativan 1 mg BID.   Insomnia  Patient reports this is stable today. This is managed by outside behavioral health with Ambien 10 mg qHS as needed.   See Encounters Tab for problem based charting.  Patient discussed with Dr. Rogelia BogaButcher

## 2017-01-09 LAB — HEMOGLOBIN A1C
Est. average glucose Bld gHb Est-mCnc: 108 mg/dL
Hgb A1c MFr Bld: 5.4 % (ref 4.8–5.6)

## 2017-01-09 LAB — LIPID PANEL
Chol/HDL Ratio: 2.9 ratio (ref 0.0–5.0)
Cholesterol, Total: 163 mg/dL (ref 100–199)
HDL: 57 mg/dL (ref >39)
LDL Calculated: 93 mg/dL (ref 0–99)
Triglycerides: 64 mg/dL (ref 0–149)
VLDL CHOLESTEROL CAL: 13 mg/dL (ref 5–40)

## 2017-01-11 ENCOUNTER — Encounter: Payer: Medicaid Other | Admitting: Occupational Therapy

## 2017-01-11 ENCOUNTER — Encounter: Payer: Medicaid Other | Admitting: Speech Pathology

## 2017-01-11 DIAGNOSIS — G4700 Insomnia, unspecified: Secondary | ICD-10-CM

## 2017-01-11 DIAGNOSIS — F419 Anxiety disorder, unspecified: Secondary | ICD-10-CM

## 2017-01-11 DIAGNOSIS — E669 Obesity, unspecified: Secondary | ICD-10-CM | POA: Insufficient documentation

## 2017-01-11 HISTORY — DX: Insomnia, unspecified: G47.00

## 2017-01-11 HISTORY — DX: Anxiety disorder, unspecified: F41.9

## 2017-01-11 NOTE — Assessment & Plan Note (Signed)
Patient reports this is stable today. This is managed by outside behavioral health with Ambien 10 mg qHS as needed.

## 2017-01-11 NOTE — Assessment & Plan Note (Signed)
Patient reports this is stable today. This is managed by outside behavioral health with ativan 1 mg BID.

## 2017-01-11 NOTE — Assessment & Plan Note (Signed)
Patient hospitalized [facility]phillis. He completed an uncomplicated 10 day course of IV penicillin and presenting symptom of catatonia resolved. Hospital discharge notes mention that if he develops catatonia or delusional statements he should be urgently reevaluated.

## 2017-01-15 ENCOUNTER — Encounter: Payer: Medicaid Other | Admitting: Speech Pathology

## 2017-01-15 ENCOUNTER — Encounter: Payer: Medicaid Other | Admitting: Occupational Therapy

## 2017-01-15 ENCOUNTER — Encounter: Payer: Self-pay | Admitting: Infectious Diseases

## 2017-01-15 ENCOUNTER — Ambulatory Visit (INDEPENDENT_AMBULATORY_CARE_PROVIDER_SITE_OTHER): Payer: Medicaid Other | Admitting: Infectious Diseases

## 2017-01-15 VITALS — BP 104/69 | HR 69 | Temp 98.2°F | Ht 72.0 in | Wt 252.0 lb

## 2017-01-15 DIAGNOSIS — Z8619 Personal history of other infectious and parasitic diseases: Secondary | ICD-10-CM

## 2017-01-15 DIAGNOSIS — Z8661 Personal history of infections of the central nervous system: Secondary | ICD-10-CM | POA: Diagnosis present

## 2017-01-15 DIAGNOSIS — A523 Neurosyphilis, unspecified: Secondary | ICD-10-CM | POA: Diagnosis not present

## 2017-01-15 NOTE — Progress Notes (Signed)
Internal Medicine Clinic Attending  Case discussed with Dr. Blum at the time of the visit.  We reviewed the resident's history and exam and pertinent patient test results.  I agree with the assessment, diagnosis, and plan of care documented in the resident's note. 

## 2017-01-15 NOTE — Progress Notes (Signed)
Patient Name: Eric Stephens E Rickel  MRN: [id number]  DOB: 05/28/1981    Reason for Visit: Neurosyphilis   Referring Provider: Dr. Darrin NipperGiles Crowell  INFECTIOUS DISEASE OFFICE CONSULT SUBJECTIVE:  HPI: Eric Stephens is a 36 y.o. AA male patient presenting to clinic today for initial consultation for neurosyphilis. His father accompanies him today and provided much of the history.   Approximately 18 years ago his father tells me that Eric Stephens contracted syphilis. [facility] with 2 shots with no further follow up. In July 2017 was [facility] with bizarre behavior c/w schizophrenia. In August 2017 he was hospitalized at [facility] after continued psychosis, auditory and visual hallucinations and inability to perform self care after initially being prescribed Seroquel. After reviewing this hospitalization records he failed several medications and was [facility]. Father reports that he was re-worked up and LP was performed ~October/December of 2017 to which he was found to have neurosyphilis and was treated with 10 day course of antibiotics. Since that time his antipsychotic medications have been stopped and now maintained on anti-anxiety medications.   He has since had follow up outpatient [facility]. No occular involvement. No further issues related to auditory or visual hallucinations. Eric Stephens does not acknowledge tremor when I asked him specifically, however his father does report this is the case. Denies any fevers, headaches, neck pain, visual disturbances, rashes (aside from a dry pruritic rash on his buttocks that is resolving with benadryl). No sexual contacts since treatment and he is now living with his father.   Patient Active Problem List   Diagnosis Date Noted  . Obesity (BMI 30-39.9) 01/11/2017  . Anxiety  01/11/2017  . Insomnia 01/11/2017  . Neurosyphilis 01/08/2017    Patient's Medications  New Prescriptions   No medications on file  Previous Medications   LORAZEPAM (ATIVAN) 1 MG TABLET    Take 1 mg by mouth 2 (two) times daily.   PYRIDOXINE (B-6) 200 MG TABLET    Take 200 mg by mouth 2 (two) times daily.   VITAMIN E 400 UNIT CAPSULE    Take 400 Units by mouth daily.   ZOLPIDEM (AMBIEN) 10 MG TABLET    Take 10 mg by mouth at bedtime as needed for sleep.  Modified Medications   No medications on file  Discontinued Medications   No medications on file    Review of Systems  Constitutional: Negative for chills, fever, malaise/fatigue and weight loss.  HENT: Negative for sore throat.        No dental problems  Respiratory: Negative for cough and sputum production.   Cardiovascular: Negative for chest pain and leg swelling.  Gastrointestinal: Negative for abdominal pain, diarrhea and vomiting.  Genitourinary: Negative.   Musculoskeletal: Negative for neck pain.  Skin: Positive for rash (pruritic rash to buttocks. improving on suggested tx from other provider).  Neurological: Positive for tremors. Negative for dizziness, tingling and headaches.  Psychiatric/Behavioral: Positive for memory loss. Negative for depression, hallucinations and substance abuse. The patient is nervous/anxious and has insomnia.     Past Medical History:  Diagnosis Date  . Syphilis     Social History  Substance Use Topics  . Smoking status: Former Smoker    Packs/day: 0.50    Types: Cigarettes  . Smokeless tobacco: Never Used  . Alcohol use  No    Family History  Problem Relation Age of Onset  . Hypertension Father   . Hypertension Mother   . Arthritis Mother      No Known Allergies   OBJECTIVE: Vitals:   01/15/17 0846  BP: 104/69  Pulse: 69  Temp: 98.2 F (36.8 C)  TempSrc: Oral  Weight: 252 lb (114.3 kg)  Height: 6' (1.829 m)   Body mass index is 34.18 kg/m.  Physical Exam    Constitutional: He is oriented to person, place, and time and well-developed, well-nourished, and in no distress.  HENT:  Mouth/Throat: No oral lesions. Normal dentition. No dental caries.  Eyes: No scleral icterus.  Cardiovascular: Normal rate, regular rhythm and normal heart sounds.   Pulmonary/Chest: Effort normal and breath sounds normal.  Abdominal: Soft. He exhibits no distension. There is no tenderness.  Lymphadenopathy:    He has no cervical adenopathy.  Neurological: He is alert and oriented to person, place, and time. He has normal sensation. He is not agitated. He displays tremor. He displays facial symmetry. No cranial nerve deficit. He has a normal Romberg Test. He shows no pronator drift. Coordination abnormal.  Skin: Skin is warm and dry. No rash noted.  Psychiatric: His mood appears anxious. He is not agitated. He has a flat affect.  Did not participate much during history and exam.    LABS    ASSESSMENT & PLAN:  Problem List Items Addressed This Visit      Nervous and Auditory   Neurosyphilis    Psychiatric symptoms seem to have resolved since completion of treatment. He now displays some residual tremors, dyskinetic movements and altered thought processing - ?Related to ECTs and antipsychotic medications he previously received. I [facility] and May 2018 - No pleocytosis on either CSF samples and VDRL titers with > 4-fold reduction after treatment (1:64 --> 1:8). I do not at this point feel he needs to undergo follow up LP's. I will check serum RPR today and will follow up again in 3 months. I counseled today that although he was treated there is always a lifetime risk of re-infection and safe sex practices are advised.   I have [facility] to see what other possible neurodegenerative diagnosis were excluded during work up as he continues to have the above symptoms.         Other Visit Diagnoses    Hx of neurosyphilis    -  Primary   Relevant Orders   RPR   HIV antibody     There is question regarding HIV as a possible diagnosis in his chart but no formal testing found aside from un-dectable RNA from 12/2015. Apparently during a previous incarceration he was told that he had HIV but not currently on medications. Likely this was disproved, however nothing in chart definitively - will check HIV Ab today.    Rexene AlbertsStephanie Dixon, MSN, [facility]  01/15/2017  12:29 PM

## 2017-01-15 NOTE — Patient Instructions (Addendum)
Nice to meet you both today.   We will check some blood work today.   Will look through the records of your last hospitalization and discuss need for follow up testing.

## 2017-01-15 NOTE — Assessment & Plan Note (Addendum)
Psychiatric symptoms seem to have resolved since completion of treatment. He now displays some residual tremors, dyskinetic movements and altered thought processing - ?Related to ECTs and antipsychotic medications he previously received. I [facility] and May 2018 - No pleocytosis on either CSF samples and VDRL titers with > 4-fold reduction after treatment (1:64 --> 1:8). I do not at this point feel he needs to undergo follow up LP's. I will check serum RPR today and will follow up again in 3 months. I counseled today that although he was treated there is always a lifetime risk of re-infection and safe sex practices are advised.   I have [facility] to see what other possible neurodegenerative diagnosis were excluded during work up as he continues to have the above symptoms.

## 2017-01-16 ENCOUNTER — Telehealth: Payer: Self-pay | Admitting: Infectious Diseases

## 2017-01-16 ENCOUNTER — Encounter: Payer: Medicaid Other | Admitting: Occupational Therapy

## 2017-01-16 LAB — RPR: RPR Ser Ql: REACTIVE — AB

## 2017-01-16 LAB — RPR TITER

## 2017-01-16 LAB — FLUORESCENT TREPONEMAL AB(FTA)-IGG-BLD: FLUORESCENT TREPONEMAL ABS: REACTIVE — AB

## 2017-01-16 LAB — HIV ANTIBODY (ROUTINE TESTING W REFLEX): HIV 1&2 Ab, 4th Generation: NONREACTIVE

## 2017-01-16 NOTE — Telephone Encounter (Signed)
Called to discuss with [name]'s father blood results and plan going forward however he did not answer. I left generic message asking to return call so I may discuss below:   I have reviewed the previous lumbar punctures and between October and May there has been excellent reduction of VDRL levels in the spinal fluid, which indicates clearance of syphilis. Based on this I do not think it is necessary to continue to repeat this invasive test. With the blood work we obtained yesterday we can repeat this again 3 months and continue to monitor clearance through blood work.   Rexene AlbertsStephanie Dixon, NP

## 2017-01-16 NOTE — Telephone Encounter (Signed)
Patient's father called back and we discussed. Agreeable to follow up again with blood work in 3 months.

## 2017-02-06 ENCOUNTER — Ambulatory Visit (INDEPENDENT_AMBULATORY_CARE_PROVIDER_SITE_OTHER): Payer: Medicaid Other | Admitting: Internal Medicine

## 2017-02-06 VITALS — BP 121/64 | HR 75 | Temp 98.0°F | Wt 271.3 lb

## 2017-02-06 DIAGNOSIS — R600 Localized edema: Secondary | ICD-10-CM

## 2017-02-06 HISTORY — DX: Localized edema: R60.0

## 2017-02-06 LAB — BRAIN NATRIURETIC PEPTIDE: B Natriuretic Peptide: 37.4 pg/mL (ref 0.0–100.0)

## 2017-02-06 NOTE — Progress Notes (Signed)
Internal Medicine Clinic Attending  I saw and evaluated the patient.  I personally confirmed the key portions of the history and exam documented by Dr. Santos and I reviewed pertinent patient test results.  The assessment, diagnosis, and plan were formulated together and I agree with the documentation in the resident's note. 

## 2017-02-06 NOTE — Assessment & Plan Note (Addendum)
Patient accompanied by father today. He is presenting with bilateral lower extremity edema and mild edema of left hand that started 1 month ago and has progressively worsened. Has had a 20 pound weight gain in the past month. Per father, he believes this to be secondary to swelling because he has not noticed any changes in clothes size. Patient does not have a history of heart, liver, or kidney disease. Denies alcohol intake. Denies chest pain, shortness of breath, dyspnea on exertion, orthopnea, and PND. No recent long car trips or plane trips. Per father, patient snores at night and had a recent choking episode also sleeping. Patient denies headache and daytime sleepiness or fatigue.  On exam, he has pitting edema of L>R lower extremities and mild edema of left hand noticeable in the knuckles. Confirmed medications with patient. Currently on no medications that could cause lower extremity edema. Differential diagnosis includes heart failure given history of tertiary syphilis  (treated for neurosyphilis in 03/2016), which can cause narrowing of coronary arteries and aortic regurgitation, as well as kidney (nephrotic syndrome) or liver disease. CMP from 12/2015 with normal creatinine and normal transaminases. Obstructive sleep apnea leading to cor pulmonale also in the differential given symptoms reported by father. Low suspicion for chronic venous insufficiency given no findings on exam. Unlikely bilateral DVTs of LE, no calf tenderness on exam.   - BNP, CMET, protein-creatinine ratio, and Hep C ordered  - Recommended elevation of lower extremities to help with swelling - Follow-up in 2-4 weeks to reassess lower extremity edema - Consider TTE and sleep study if lab results normal  8/22: TSH and BNP nl. CMP with mild elevation in ALT 5. Will discuss results with patient at follow up visit.

## 2017-02-06 NOTE — Progress Notes (Signed)
   CC: Bilateral leg swelling  HPI:  [name] is a 36 y.o. male with PMH listed below who presents to clinic for bilateral lower extremity swelling. Please see problem based assessment and plan for further details.  Past Medical History:  Diagnosis Date  . Syphilis    Review of Systems:   Review of Systems  Constitutional: Negative for chills, fever and weight loss.  Respiratory: Negative for cough and shortness of breath.        No DOE  Cardiovascular: Negative for chest pain, palpitations, orthopnea, leg swelling and PND.  Gastrointestinal: Negative for abdominal pain, nausea and vomiting.  Musculoskeletal: Positive for back pain. Negative for joint pain, myalgias and neck pain.  Neurological: Negative for tingling, sensory change, focal weakness, weakness and headaches.    Physical Exam:  Vitals:   02/06/17 0933  BP: 121/64  Pulse: 75  Temp: 98 F (36.7 C)  TempSrc: Oral  SpO2: 100%  Weight: 271 lb 4.8 oz (123.1 kg)   General: Pleasant male, fidgety, sitting up in chair in no acute distress HENT: NCAT, neck supple and FROM Cardiac: regular rate and rhythm, nl S1/S2, no murmurs, rubs or gallops, no JVD noted   Pulm: CTAB, no wheezes or crackles, no increased work of breathing  Abd: soft, NTND, bowel sounds present Ext: warm and well perfused, 2+ peripheral edema up to knee in LLE and up to shin in RLE,  Very mild edema of L hand noticeable at the knuckles  Derm: No rashes or lesions noted   Assessment & Plan:   See Encounters Tab for problem based charting.  Patient seen with Dr. Rogelia Boga

## 2017-02-06 NOTE — Patient Instructions (Signed)
It was nice to meet you today, [name].  Please make sure to keep your legs elevated to help with the swelling. You can also put your feet on an Epsom salt bath to help with the swelling.   I will call you with the results of your tests.   Please make a follow up appointment with [place] ([facility]) in 2-4 weeks or sooner if your swelling continues to worsen or you start to experience new symptoms.

## 2017-02-07 LAB — CMP14 + ANION GAP
ALT: 57 IU/L — ABNORMAL HIGH (ref 0–44)
AST: 34 IU/L (ref 0–40)
Albumin/Globulin Ratio: 1.6 (ref 1.2–2.2)
Albumin: 4 g/dL (ref 3.5–5.5)
Alkaline Phosphatase: 66 IU/L (ref 39–117)
Anion Gap: 15 mmol/L (ref 10.0–18.0)
BUN/Creatinine Ratio: 16 (ref 9–20)
BUN: 11 mg/dL (ref 6–20)
Bilirubin Total: <0.2 mg/dL (ref 0.0–1.2)
CHLORIDE: 108 mmol/L — AB (ref 96–106)
CO2: 20 mmol/L (ref 20–29)
Calcium: 8.9 mg/dL (ref 8.7–10.2)
Creatinine, Ser: 0.69 mg/dL — ABNORMAL LOW (ref 0.76–1.27)
GFR calc non Af Amer: 121 mL/min/{1.73_m2} (ref >59)
GFR, EST AFRICAN AMERICAN: 140 mL/min/{1.73_m2} (ref >59)
GLUCOSE: 102 mg/dL — AB (ref 65–99)
Globulin, Total: 2.5 g/dL (ref 1.5–4.5)
Potassium: 4.1 mmol/L (ref 3.5–5.2)
SODIUM: 143 mmol/L (ref 134–144)
Total Protein: 6.5 g/dL (ref 6.0–8.5)

## 2017-02-07 LAB — HEPATITIS C ANTIBODY: Hep C Virus Ab: <0.1 s/co ratio (ref 0.0–0.9)

## 2017-02-13 ENCOUNTER — Telehealth: Payer: Self-pay

## 2017-02-13 NOTE — Telephone Encounter (Signed)
Talked to patient's father who is his caretaker. Father aware of results. No interventions at this time as we still need a urine sample from patient. Per father, he will bring patient in this week.

## 2017-02-13 NOTE — Telephone Encounter (Signed)
Request sent to Dr Santos-Sanchez. 

## 2017-02-13 NOTE — Telephone Encounter (Signed)
Requesting test results. Please call pt back.  

## 2017-02-14 ENCOUNTER — Other Ambulatory Visit: Payer: Medicaid Other

## 2017-02-15 LAB — PROTEIN / CREATININE RATIO, URINE
Creatinine, Urine: 225.4 mg/dL
PROTEIN UR: 31.2 mg/dL
PROTEIN/CREAT RATIO: 138 mg/g{creat} (ref 0–200)

## 2017-02-16 NOTE — Addendum Note (Signed)
Addended by: Burna CashSANTOS-SANCHEZ, Ingeborg Fite on: 02/16/2017 04:29 PM   Modules accepted: Orders

## 2017-02-26 ENCOUNTER — Telehealth: Payer: Self-pay

## 2017-02-26 NOTE — Telephone Encounter (Signed)
Lm for rtc 

## 2017-02-26 NOTE — Telephone Encounter (Signed)
Requesting to speak with a nurse about meds. Please call back.  

## 2017-02-28 ENCOUNTER — Ambulatory Visit (HOSPITAL_COMMUNITY): Admission: RE | Admit: 2017-02-28 | Payer: Medicaid Other | Source: Ambulatory Visit

## 2017-03-02 ENCOUNTER — Other Ambulatory Visit: Payer: Self-pay | Admitting: Internal Medicine

## 2017-03-02 DIAGNOSIS — R609 Edema, unspecified: Secondary | ICD-10-CM

## 2017-03-06 ENCOUNTER — Ambulatory Visit (HOSPITAL_COMMUNITY)
Admission: RE | Admit: 2017-03-06 | Discharge: 2017-03-06 | Disposition: A | Discharge status: Home / Self Care | Payer: Medicaid Other | Source: Ambulatory Visit | Attending: Internal Medicine | Admitting: Internal Medicine

## 2017-03-06 DIAGNOSIS — I509 Heart failure, unspecified: Secondary | ICD-10-CM | POA: Insufficient documentation

## 2017-03-06 DIAGNOSIS — E669 Obesity, unspecified: Secondary | ICD-10-CM | POA: Insufficient documentation

## 2017-03-06 DIAGNOSIS — I517 Cardiomegaly: Secondary | ICD-10-CM | POA: Diagnosis not present

## 2017-03-06 DIAGNOSIS — R109 Unspecified abdominal pain: Secondary | ICD-10-CM | POA: Diagnosis not present

## 2017-03-06 DIAGNOSIS — Z5321 Procedure and treatment not carried out due to patient leaving prior to being seen by health care provider: Secondary | ICD-10-CM | POA: Diagnosis not present

## 2017-03-06 DIAGNOSIS — R600 Localized edema: Secondary | ICD-10-CM

## 2017-03-06 DIAGNOSIS — R609 Edema, unspecified: Secondary | ICD-10-CM | POA: Diagnosis not present

## 2017-03-06 DIAGNOSIS — Z6836 Body mass index (BMI) 36.0-36.9, adult: Secondary | ICD-10-CM | POA: Insufficient documentation

## 2017-03-06 NOTE — Progress Notes (Signed)
  Echocardiogram 2D Echocardiogram has been performed.  Nolon Rod 03/06/2017, 9:18 AM

## 2017-03-07 ENCOUNTER — Ambulatory Visit (INDEPENDENT_AMBULATORY_CARE_PROVIDER_SITE_OTHER): Payer: Medicaid Other

## 2017-03-07 ENCOUNTER — Encounter (HOSPITAL_COMMUNITY): Payer: Self-pay

## 2017-03-07 ENCOUNTER — Ambulatory Visit (HOSPITAL_COMMUNITY)
Admission: EM | Admit: 2017-03-07 | Discharge: 2017-03-07 | Disposition: A | Discharge status: Home / Self Care | Payer: Medicaid Other | Attending: Family Medicine | Admitting: Family Medicine

## 2017-03-07 ENCOUNTER — Emergency Department (HOSPITAL_COMMUNITY)
Admission: EM | Admit: 2017-03-07 | Discharge: 2017-03-07 | Disposition: A | Discharge status: Home / Self Care | Payer: Medicaid Other | Attending: Emergency Medicine | Admitting: Emergency Medicine

## 2017-03-07 ENCOUNTER — Encounter (HOSPITAL_COMMUNITY): Payer: Self-pay | Admitting: *Deleted

## 2017-03-07 DIAGNOSIS — S20211A Contusion of right front wall of thorax, initial encounter: Secondary | ICD-10-CM

## 2017-03-07 DIAGNOSIS — R0781 Pleurodynia: Secondary | ICD-10-CM

## 2017-03-07 LAB — URINALYSIS, ROUTINE W REFLEX MICROSCOPIC
Bilirubin Urine: NEGATIVE
Glucose, UA: NEGATIVE mg/dL
Hgb urine dipstick: NEGATIVE
Ketones, ur: NEGATIVE mg/dL
LEUKOCYTES UA: NEGATIVE
Nitrite: NEGATIVE
PH: 5 (ref 5.0–8.0)
Protein, ur: NEGATIVE mg/dL
SPECIFIC GRAVITY, URINE: 1.023 (ref 1.005–1.030)

## 2017-03-07 LAB — COMPREHENSIVE METABOLIC PANEL
ALBUMIN: 3.4 g/dL — AB (ref 3.5–5.0)
ALT: 26 U/L (ref 17–63)
ANION GAP: 8 (ref 5–15)
AST: 35 U/L (ref 15–41)
Alkaline Phosphatase: 65 U/L (ref 38–126)
BILIRUBIN TOTAL: 0.4 mg/dL (ref 0.3–1.2)
BUN: 11 mg/dL (ref 6–20)
CALCIUM: 8.6 mg/dL — AB (ref 8.9–10.3)
CO2: 21 mmol/L — ABNORMAL LOW (ref 22–32)
Chloride: 113 mmol/L — ABNORMAL HIGH (ref 101–111)
Creatinine, Ser: 0.93 mg/dL (ref 0.61–1.24)
GFR calc non Af Amer: >60 mL/min (ref >60)
GLUCOSE: 125 mg/dL — AB (ref 65–99)
POTASSIUM: 3.7 mmol/L (ref 3.5–5.1)
SODIUM: 142 mmol/L (ref 135–145)
TOTAL PROTEIN: 6.7 g/dL (ref 6.5–8.1)

## 2017-03-07 LAB — CBC
HEMATOCRIT: 38.1 % — AB (ref 39.0–52.0)
HEMOGLOBIN: 12.3 g/dL — AB (ref 13.0–17.0)
MCH: 27.9 pg (ref 26.0–34.0)
MCHC: 32.3 g/dL (ref 30.0–36.0)
MCV: 86.4 fL (ref 78.0–100.0)
Platelets: 277 10*3/uL (ref 150–400)
RBC: 4.41 MIL/uL (ref 4.22–5.81)
RDW: 14.9 % (ref 11.5–15.5)
WBC: 8.7 10*3/uL (ref 4.0–10.5)

## 2017-03-07 LAB — LIPASE, BLOOD: Lipase: 34 U/L (ref 11–51)

## 2017-03-07 NOTE — ED Triage Notes (Signed)
Pt states that he woke up with rib and abd pain in the RUQ along with some nausea and vomiting. Denies diarrhea or vomiting.

## 2017-03-07 NOTE — ED Notes (Signed)
Pt left/walked out because he "did not want to wait any longer and will come back in the morning".

## 2017-03-07 NOTE — ED Triage Notes (Signed)
Pt  Reports  He  Larey Seat  About   5 days  Ago  And  Injured his  r   Rib  Area   He  Has  Pain on    Movement        And  Palpation      He  Reports    Pain is   Worse   When he  Takes  A   Deep  Breath  As   Well     Pt  Ambulated  To  Exam room

## 2017-03-09 ENCOUNTER — Ambulatory Visit (INDEPENDENT_AMBULATORY_CARE_PROVIDER_SITE_OTHER): Payer: Medicaid Other | Admitting: Internal Medicine

## 2017-03-09 ENCOUNTER — Encounter: Payer: Self-pay | Admitting: Internal Medicine

## 2017-03-09 VITALS — BP 123/79 | HR 81 | Temp 98.2°F | Ht 72.0 in | Wt 277.4 lb

## 2017-03-09 DIAGNOSIS — Z8619 Personal history of other infectious and parasitic diseases: Secondary | ICD-10-CM

## 2017-03-09 DIAGNOSIS — Z6837 Body mass index (BMI) 37.0-37.9, adult: Secondary | ICD-10-CM | POA: Diagnosis not present

## 2017-03-09 DIAGNOSIS — Z9181 History of falling: Secondary | ICD-10-CM

## 2017-03-09 DIAGNOSIS — R600 Localized edema: Secondary | ICD-10-CM

## 2017-03-09 DIAGNOSIS — G8911 Acute pain due to trauma: Secondary | ICD-10-CM

## 2017-03-09 DIAGNOSIS — R0789 Other chest pain: Secondary | ICD-10-CM | POA: Diagnosis not present

## 2017-03-09 DIAGNOSIS — M7989 Other specified soft tissue disorders: Secondary | ICD-10-CM

## 2017-03-09 DIAGNOSIS — G4700 Insomnia, unspecified: Secondary | ICD-10-CM

## 2017-03-09 DIAGNOSIS — E669 Obesity, unspecified: Secondary | ICD-10-CM | POA: Diagnosis not present

## 2017-03-09 DIAGNOSIS — R0781 Pleurodynia: Secondary | ICD-10-CM | POA: Insufficient documentation

## 2017-03-09 DIAGNOSIS — M79662 Pain in left lower leg: Secondary | ICD-10-CM | POA: Diagnosis not present

## 2017-03-09 NOTE — Addendum Note (Signed)
Addended by: Nyra Market on: 03/09/2017 04:06 PM   Modules accepted: Orders

## 2017-03-09 NOTE — Assessment & Plan Note (Signed)
Patient has about a 3 month history of bil LE edema left worse than right, and a one day history of pain in his left calf. The swelling does not improve with leg elevation and is consistent throughout the day. Patient is relatively sedentary at this time due to fatigue and insomnia. Patient has gained about 30lbs this summer. He denies chest pain, shortness of breath. Echo was obtained last week which showed EF 55-60% and no diastolic or valvular dysfunction. CMP, BNP, Pr/Cr ratio are unremarkable.  Exam reveals L calf 3cm > R calf, with tenser pitting edema on left, though 2+ bilaterally to knees.   Etiology of his swelling could be 2/2 lymphedema esp in setting of weight gain as other workup has been negative. He has a high risk Wells score for DVT in leg, so we will obtain a LE venous U/S.  Advised patient to work on weight loss and inc in activity.

## 2017-03-09 NOTE — Assessment & Plan Note (Signed)
Patient with insomnia on ambien and daytime fatigue which is limiting his activity. He is obese and continues to gain weight. It is likely he has OSA. For now, have advised his to work on activity and diet changes. He may need formal sleep study evaluation in the future if unable to manage with lifestyle changes.

## 2017-03-09 NOTE — Progress Notes (Signed)
   CC: right side pain and leg swelling  HPI:  [name] is a 36 y.o. with a PMH of neurosyphilis presenting to clinic for evaluation of right side pain and lower extremity swelling.  Patient states he fell on the sidewalk about 1 week ago and has since had pain in his right side which was exacerbated by breathing, however this has resolved now. It is mainly now only tender. He denies abdominal pain, nausea, vomiting, fevers, chills, diarrhea. He has taken OTC ibuprofen which has helped with the pain.  Patient also has about a 3 month history of bil LE edema left worse than right, and a one day history of pain in his left calf. The swelling does not improve with leg elevation and is consistent throughout the day. Patient is relatively sedentary at this time due to fatigue and insomnia. Patient has gained about 30lbs this summer. He denies chest pain, shortness of breath. Echo was obtained last week which showed EF 55-60% and no diastolic or valvular dysfunction. CMP, BNP, Pr/Cr ratio are unremarkable.   Please see problem based Assessment and Plan for status of patients chronic conditions.  Past Medical History:  Diagnosis Date  . Syphilis     Review of Systems:   ROS Per HPI  Physical Exam:  Vitals:   03/09/17 0925  BP: (!) 150/134  Pulse: 80  Temp: 98.2 F (36.8 C)  TempSrc: Oral  SpO2: 99%  Weight: 277 lb 6.4 oz (125.8 kg)  Height: 6' (1.829 m)   GENERAL- alert, co-operative, appears as stated age, not in any distress, tremulous, dysarthric. HEENT- Atraumatic, normocephalic CARDIAC- RRR, no murmurs, rubs or gallops. CHEST - tenderness to palpation of midclavicular, right lower ribs RESP- Moving equal volumes of air, and clear to auscultation bilaterally, no wheezes or crackles. ABDOMEN- Soft, nontender, bowel sounds present.[name] EXTREMITIES- pulse 2+, symmetric. 2+ pitting edema in bil LE to knees; tense edema on the left with tenderness to palpation of lateral and  posterior calf. No cords palpable. L calf swelling 48cm, R calf swelling 45cm SKIN- Warm, dry, no rash or lesion. PSYCH- Normal mood and affect, appropriate thought content and speech.  Assessment & Plan:   See Encounters Tab for problem based charting.   Patient seen with Dr. Lorin Picket, MD Internal Medicine PGY2

## 2017-03-09 NOTE — Assessment & Plan Note (Signed)
Pain in lower right ribs following a fall. Xrays in ED are negative for fractures. He is over all improving already and no signs or symptoms of other etiology present at this time. Will continue to monitor.

## 2017-03-09 NOTE — Patient Instructions (Signed)
We will get an ultrasound of your left leg to evaluate for a blood clot.   Continue working on increasing your activity and diet changes. Try to focus on drinking only water if possible, and eating non processed foods like fruits, veggies and avoiding fried foods.

## 2017-03-10 NOTE — ED Provider Notes (Signed)
  [facility]   [id number] 03/07/17 Arrival Time: 1547  ASSESSMENT & PLAN:  1. Rib pain on right side   2. Rib contusion, right, initial encounter    Prefers OTC analgesics. May f/u as needed. Discussed typical healing time. Reviewed expectations re: course of current medical issues. Questions answered. Outlined signs and symptoms indicating need for more acute intervention. Patient verbalized understanding. After Visit Summary given.   SUBJECTIVE:  Eric Stephens is a 36 y.o. male who presents with complaint of R lower rib pain. Describes as sharp. Injured area approx 5 days ago when he fell. No SOB or respiratory difficulties. Increased rib discomfort with deep breathing. OTC ibuprofen with temporary help. No worsening of discomfort. No abdominal pain. Sitting still helps.  ROS: As per HPI. All other systems negative.   OBJECTIVE:  Vitals:   03/07/17 1642  BP: 132/74  Pulse: 78  Resp: 18  Temp: 98.6 F (37 C)  TempSrc: Oral  SpO2: 100%    General appearance: alert; no distress Neck: supple Lungs: clear to auscultation bilaterally Heart: regular rate and rhythm Chest Wall: tender over R lower ribs; no bruising Abdomen: soft, non-tender; bowel sounds normal; no masses or organomegaly; no guarding or rebound tenderness Back: no CVA tenderness Extremities: no cyanosis or edema; symmetrical with no gross deformities Skin: warm and dry Psychological: alert and cooperative; normal mood and affect  No Known Allergies  Past Medical History:  Diagnosis Date  . Syphilis    Social History   Social History  . Marital status: Single    Spouse name: N/A  . Number of children: N/A  . Years of education: N/A   Occupational History  . Not on file.   Social History Main Topics  . Smoking status: Former Smoker    Packs/day: 0.50    Types: Cigarettes  . Smokeless tobacco: Never Used  . Alcohol use No  . Drug use: No  . Sexual activity: Yes   Other Topics  Concern  . Not on file   Social History Narrative  . No narrative on file   Family History  Problem Relation Age of Onset  . Hypertension Father   . Hypertension Mother   . Arthritis Mother    Past Surgical History:  Procedure Laterality Date  . WRIST SURGERY       Mardella Layman, MD 03/10/17 1002

## 2017-03-12 NOTE — Progress Notes (Signed)
Internal Medicine Clinic Attending  I saw and evaluated the patient.  I personally confirmed the key portions of the history and exam documented by Dr. Svalina and I reviewed pertinent patient test results.  The assessment, diagnosis, and plan were formulated together and I agree with the documentation in the resident's note.  

## 2017-03-19 ENCOUNTER — Ambulatory Visit (HOSPITAL_COMMUNITY)
Admission: RE | Admit: 2017-03-19 | Discharge: 2017-03-19 | Disposition: A | Discharge status: Home / Self Care | Payer: Medicaid Other | Source: Ambulatory Visit | Attending: Student in an Organized Health Care Education/Training Program | Admitting: Student in an Organized Health Care Education/Training Program

## 2017-03-19 DIAGNOSIS — R600 Localized edema: Secondary | ICD-10-CM | POA: Insufficient documentation

## 2017-03-19 NOTE — Progress Notes (Signed)
VASCULAR LAB PRELIMINARY  PRELIMINARY  PRELIMINARY  PRELIMINARY  Bilateral lower extremity venous duplex completed.    Preliminary report:  There is no obvious evidence of DVT or SVT noted in the visualized veins of the bilateral lower extremities.   Yordi Krager, RVT 03/19/2017, 11:58 AM

## 2017-03-20 ENCOUNTER — Ambulatory Visit (HOSPITAL_COMMUNITY): Payer: No Typology Code available for payment source

## 2017-04-17 ENCOUNTER — Ambulatory Visit (INDEPENDENT_AMBULATORY_CARE_PROVIDER_SITE_OTHER): Payer: Medicaid Other | Admitting: Infectious Diseases

## 2017-04-17 VITALS — BP 131/81 | HR 82 | Temp 99.3°F | Wt 273.0 lb

## 2017-04-17 DIAGNOSIS — A523 Neurosyphilis, unspecified: Secondary | ICD-10-CM

## 2017-04-17 NOTE — Progress Notes (Signed)
Patient Name: Eric Stephens  MRN: [id number]  DOB: 05-Oct-1980    Reason for Visit: Neurosyphilis   Referring Provider: Dr. Darrin Nipper  INFECTIOUS DISEASE OFFICE VISIT SUBJECTIVE:  HPI: Eric Stephens is a 36 y.o. AA male patient here for follow up on his neurosyphilis. His father accompanies him today and provided most of the history.   Approximately 18 years ago Eric Stephens contracted syphilis. [facility] with 2 shots with no further follow up. In July 2017 was seen in the ED here at [facility] twice with bizarre behavior c/w schizophrenia. In August 2017 he was hospitalized at [facility] after continued psychosis, auditory and visual hallucinations and inability to perform self care after initially being prescribed Seroquel. After reviewing this hospitalization records he failed several medications and was transferred to [facility] for ongoing psychiatric needs. Was found to have neurosyphilis and was treated with 10 day course of penicillin infusion in October 2017 after LP obtained revealing VDRL with 1:64 titer. Repeat LP was obtained in May 2018 and showed appropriately decreased titer to 1:8, no pleocytosis. Since that time his antipsychotic medications have been stopped and now maintained on anti-anxiety medications. He has since had follow up outpatient with Neurology and Opthalmology at [facility]. No occular involvement. No further issues related to auditory or visual hallucinations.   Interval History: Only complaint today is headaches, off an on last 2 weeks across front of head. Goes away on it's own without intervention. Denies vision changes, eye or neck pain. Tells me he is on a new medication for his anxiety/anger outbursts "little orange capsule he takes 2x a day". Father is asking whether the anxiety is related to the syphilis infection. Tells me he is working on getting him in with a new  counselor/psychiatrist here in [place] area.   Patient Active Problem List   Diagnosis Date Noted  . Rib pain on right side 03/09/2017  . Bilateral lower extremity edema 02/06/2017  . Obesity (BMI 30-39.9) 01/11/2017  . Anxiety 01/11/2017  . Insomnia 01/11/2017  . Neurosyphilis 01/08/2017    Patient's Medications  New Prescriptions   No medications on file  Previous Medications   LORAZEPAM (ATIVAN) 1 MG TABLET    Take 1 mg by mouth 2 (two) times daily.   PYRIDOXINE (B-6) 200 MG TABLET    Take 200 mg by mouth 2 (two) times daily.   VITAMIN E 400 UNIT CAPSULE    Take 400 Units by mouth daily.   ZOLPIDEM (AMBIEN) 10 MG TABLET    Take 10 mg by mouth at bedtime as needed for sleep.  Modified Medications   No medications on file  Discontinued Medications   No medications on file    Review of Systems  Constitutional: Negative for chills, fever, malaise/fatigue and weight loss.  HENT: Negative for sore throat.        No dental problems  Eyes: Negative for blurred vision, double vision and photophobia.  Respiratory: Negative for cough and sputum production.   Cardiovascular: Negative for chest pain and leg swelling.  Gastrointestinal: Negative for abdominal pain, diarrhea and vomiting.  Genitourinary: Negative.   Musculoskeletal: Negative for back pain and neck pain.  Skin: Negative for rash.  Neurological: Positive for tremors and headaches. Negative for dizziness and tingling.  Psychiatric/Behavioral: Positive for memory loss. Negative for depression, hallucinations and substance abuse. The patient is nervous/anxious. The patient does not  have insomnia.     Past Medical History:  Diagnosis Date  . Syphilis     No Known Allergies   OBJECTIVE: Vitals:   04/17/17 0951 04/17/17 0955  BP: 131/81   Pulse: (!) 120 82  Temp: 99.3 F (37.4 C)   SpO2:  99%  Weight: 273 lb (123.8 kg)    Body mass index is 37.03 kg/m.  Physical Exam  Constitutional: He is oriented to  person, place, and time and well-developed, well-nourished, and in no distress.  HENT:  Mouth/Throat: Oropharynx is clear and moist. No oral lesions. Normal dentition. No dental caries.  Eyes: Pupils are equal, round, and reactive to light. EOM are normal. No scleral icterus.  Cardiovascular: Normal rate, regular rhythm and normal heart sounds.   Pulmonary/Chest: Effort normal and breath sounds normal.  Abdominal: Soft. He exhibits no distension. There is no tenderness.  Musculoskeletal: Normal range of motion.  Lymphadenopathy:    He has no cervical adenopathy.  Neurological: He is alert and oriented to person, place, and time. He has normal sensation. He is not agitated. He displays tremor. He displays facial symmetry. No cranial nerve deficit. He has a normal Romberg Test. He shows no pronator drift. Coordination abnormal.  Skin: Skin is warm and dry. No rash noted.  Psychiatric: His mood appears anxious. He is not agitated. He has a flat affect.  Difficult to understand at times. Does not participate much during exam and defers to father to answer question.    LABS    ASSESSMENT & PLAN:  Problem List Items Addressed This Visit      Nervous and Auditory   Neurosyphilis - Primary    HIV test with last office visit was negative - discussed this with Eric Stephens and his father to clear up any previous questions they may have about this condition. Headaches present x 2 weeks now but started on new medication recently for anxiety so may correlate to that - they appear to be resolved without intervention. Will check RPR again today to assess titer to monitor for infection cure.   Follow up pending these results.       Relevant Orders   RPR      Rexene AlbertsStephanie Dixon, MSN, [facility] LLCFNP-C [facility]  04/17/2017  1:17 PM

## 2017-04-17 NOTE — Assessment & Plan Note (Addendum)
HIV test with last office visit was negative - discussed this with Eric Stephens and his father to clear up any previous questions they may have about this condition. Headaches present x 2 weeks now but started on new medication recently for anxiety so may correlate to that - they appear to be resolved without intervention. Will check RPR again today to assess titer to monitor for infection cure.   Follow up pending these results.

## 2017-04-17 NOTE — Patient Instructions (Signed)
No changes in medications today.   Will check your blood work today and call you with results and plan for follow up visit if needed.

## 2017-04-18 LAB — RPR: RPR Ser Ql: REACTIVE — AB

## 2017-04-18 LAB — RPR TITER: RPR Titer: 1:16 {titer} — ABNORMAL HIGH

## 2017-04-18 LAB — FLUORESCENT TREPONEMAL AB(FTA)-IGG-BLD: FLUORESCENT TREPONEMAL ABS: REACTIVE — AB

## 2017-05-01 ENCOUNTER — Telehealth: Payer: Self-pay

## 2017-05-01 NOTE — Telephone Encounter (Signed)
Requesting lab results. Please call pt back.  

## 2017-05-01 NOTE — Telephone Encounter (Signed)
My last visit with patient was for LE swelling for which he was evaluated with a LE doppler - these results had previously been communicated to pt and father. It appears that he was seen by ID most recently on Oct 30th for which he had lab work done. I would suggest they call the ID office to discuss these results if PCP (Dr. Crista ElliotHarbrecht) agrees.  Eric Stephens

## 2017-05-02 NOTE — Telephone Encounter (Signed)
Attempted to rtc, lm for rtc 

## 2017-05-02 NOTE — Telephone Encounter (Signed)
I agree. If ID ordered the labs I believe it would be most efficacious if they provided the feedback from those labs.  Thank you

## 2017-05-03 ENCOUNTER — Telehealth: Payer: Self-pay | Admitting: Infectious Diseases

## 2017-05-03 DIAGNOSIS — Z8661 Personal history of infections of the central nervous system: Secondary | ICD-10-CM

## 2017-05-03 DIAGNOSIS — Z8619 Personal history of other infectious and parasitic diseases: Secondary | ICD-10-CM

## 2017-05-03 NOTE — Telephone Encounter (Signed)
A user error has taken place: duplication of chart

## 2017-05-03 NOTE — Telephone Encounter (Signed)
Called to discuss RPR results with Makarios's father.   Follow up RPR with same titer 1:16. At the last visit he was reporting some increased agitation and anxiety with [place]. In clarification today this is not the same kind of presentation he had back in August of 2017 when he was initially worked up and diagnosed.   No previous serum RPR available from previous testing only VDRL of CSF. If he continues to have changes or worsening of psychiatric symptoms would have a low threshold to obtain LP and recheck CSF for neurosyphilis.   At this point we decided that we will recheck serum RPR in 4 months to follow with the condition he will call me if psychiatrically he declines.

## 2017-05-03 NOTE — Telephone Encounter (Signed)
Referred pt to [facility] for results

## 2017-05-03 NOTE — Telephone Encounter (Signed)
Called pt to schedule a follow up with Rexene AlbertsStephanie Dixon, NP as well as a lab appt. However, when I spoke to the pt's father he stated he would like to hold off on making any appointments since the pt and his family will be traveling for the holidays. He stated he would call one they return to make the appt. Eric CourierJose L Tymarion Stephens, [place]MA

## 2017-06-08 ENCOUNTER — Ambulatory Visit: Payer: Medicaid Other | Admitting: Internal Medicine

## 2017-06-08 ENCOUNTER — Other Ambulatory Visit: Payer: Self-pay

## 2017-06-08 DIAGNOSIS — F8081 Childhood onset fluency disorder: Secondary | ICD-10-CM

## 2017-06-08 DIAGNOSIS — Z87891 Personal history of nicotine dependence: Secondary | ICD-10-CM | POA: Diagnosis not present

## 2017-06-08 DIAGNOSIS — J4530 Mild persistent asthma, uncomplicated: Secondary | ICD-10-CM

## 2017-06-08 DIAGNOSIS — J45909 Unspecified asthma, uncomplicated: Secondary | ICD-10-CM | POA: Insufficient documentation

## 2017-06-08 MED ORDER — CETIRIZINE HCL 10 MG PO TABS: 10.0000 mg | ORAL_TABLET | Freq: Every day | ORAL | 11 refills | Status: DC

## 2017-06-08 MED ORDER — ALBUTEROL SULFATE HFA 108 (90 BASE) MCG/ACT IN AERS: 2.0000 | INHALATION_SPRAY | Freq: Four times a day (QID) | RESPIRATORY_TRACT | 2 refills | Status: DC | PRN

## 2017-06-08 NOTE — Assessment & Plan Note (Signed)
Assessment: This is a 36 year old male with reported history of childhood asthma who presents with his father for concern of nocturnal wheezing. Has been going on for the past few months. He wakes up on about 4-5 nights per week with wheezing. Episodes resolve spontaneously. Patient denied any association with activity however did note he sometimes gets wheezy while at rest. This child he was on as needed albuterol but resolved spontaneously. There are no PFTs in the system and father does not believe he's had them before. Assessment: Question mild persistent asthma in adult. His lung exam is grossly normal and I do not appreciate significant diminished breath sounds or wheezing. As the patient does have symptoms of seasonal allergies, I'll start patient on antihistamine to reduce risk of asthma flares. I'll also provide when necessary albuterol as needed. -Start Zyrtec daily -Albuterol when necessary -Follow-up in clinic in 1 month to assess response to therapy -Ordered PFTs

## 2017-06-08 NOTE — Patient Instructions (Addendum)
FOLLOW-UP INSTRUCTIONS When: 1 month For: asthma, response to prn albuterol and zyrtec What to bring: nothing   It was great meeting you both today. I am sorry you are having issues with wheezing.  I am prescribing you an albuterol inhaler to take as needed for wheezing or shortness of breath.  I also would like to treat your allergies so it will not trigger your asthma to act up.  In order to get a formal diagnosis and make sure there is not another factor affecting your breathing, I'm ordering pulmonary function tests.   Please return to clinic in 1 month for follow-up of your inhaler.

## 2017-06-08 NOTE — Progress Notes (Signed)
Internal Medicine Clinic Attending  Case discussed with Dr. Molt at the time of the visit.  We reviewed the resident's history and exam and pertinent patient test results.  I agree with the assessment, diagnosis, and plan of care documented in the resident's note. 

## 2017-06-08 NOTE — Progress Notes (Signed)
   CC: follow-up of asthma  HPI:  [name] is a 36 y.o. male with developmental disability presents with his father for evaluation of asthma. Dad notes that his son had asthma when he was a kid which resolved spontaneously by age 89. He only required when necessary albuterol and never required a maintenance medication. He was never hospitalized for this. His dad notes that the patient is been waking up at night with wheezing several times a week for the past few months. Resolves on its own eventually. Patient tells me he does not 6. Wheezing with activity however has experienced it at rest. Patient denies any history of allergies although did admit to seasonal runny nose, sneezing and watery itchy eyes.   Past Medical History:  Diagnosis Date  . Syphilis    Review of Systems:   General: Denies fevers, chills, weight loss, fatigue HEENT: Denies changes in vision, sore throat, dysphagia Cardiac: +SOB. Denies CP Pulmonary: +wheezing. No cough no mucus Abd: Denies diarrhea, changes in bowels Extremities: Denies weakness or swelling  Physical Exam: General: Alert, in no acute distress. Pleasant and conversant although does have prominent stutter speaks in short sentences. Father present, appears supportive, answered most questions. HEENT: No icterus, injection or ptosis. No hoarseness or dysarthria  Cardiac: RRR, no MGR appreciated Pulmonary: CTA BL with normal WOB on RA. Could air movement although could be mildly diminished in upper lungs Abd: Soft, non-tender. +bs Extremities: Warm, perfused. No significant pedal edema.   Vitals:   06/08/17 1049  BP: 124/63  Pulse: 73  Temp: 98.5 F (36.9 C)  TempSrc: Oral  SpO2: 99%  Weight: 263 lb 14.4 oz (119.7 kg)  Height: 6' (1.829 m)   Assessment & Plan:   See Encounters Tab for problem based charting.  Patient discussed with Dr. Cleda DaubE. Hoffman

## 2017-06-21 ENCOUNTER — Other Ambulatory Visit: Payer: Self-pay

## 2017-06-21 NOTE — Telephone Encounter (Signed)
albuterol (PROVENTIL HFA;VENTOLIN HFA) 108 (90 Base) MCG/ACT inhaler, Refill request @ [facility].

## 2017-06-21 NOTE — Telephone Encounter (Signed)
Pt has 2 refills at pharm, called pharm, they are getting it ready and will notify pt

## 2017-07-03 ENCOUNTER — Other Ambulatory Visit: Payer: Self-pay

## 2017-07-03 ENCOUNTER — Ambulatory Visit: Payer: Medicaid Other | Admitting: Internal Medicine

## 2017-07-03 VITALS — BP 119/59 | HR 73 | Temp 98.5°F | Ht 72.0 in | Wt 265.5 lb

## 2017-07-03 DIAGNOSIS — Z87891 Personal history of nicotine dependence: Secondary | ICD-10-CM

## 2017-07-03 DIAGNOSIS — J069 Acute upper respiratory infection, unspecified: Secondary | ICD-10-CM | POA: Diagnosis present

## 2017-07-03 HISTORY — DX: Acute upper respiratory infection, unspecified: J06.9

## 2017-07-03 MED ORDER — SALINE SPRAY 0.65 % NA SOLN
1.0000 | NASAL | 0 refills | Status: DC | PRN
Start: 2017-07-03 — End: 2017-07-30

## 2017-07-03 MED ORDER — BENZONATATE 100 MG PO CAPS: 100.0000 mg | ORAL_CAPSULE | Freq: Three times a day (TID) | ORAL | 0 refills | Status: DC | PRN

## 2017-07-03 NOTE — Progress Notes (Signed)
Internal Medicine Clinic Attending  Case discussed with Dr. Ahyana Skillin at the time of the visit.  We reviewed the resident's history and exam and pertinent patient test results.  I agree with the assessment, diagnosis, and plan of care documented in the resident's note.  

## 2017-07-03 NOTE — Patient Instructions (Signed)
[name],  Please start taking Tessalon Perles up to 3 times a day for your cough. Please start using nasal saline spray to help with your congestion. He may use the Chloraseptic spray or lozenges to help with your sore throat. Please follow-up if your symptoms worsen or do not improve.

## 2017-07-03 NOTE — Progress Notes (Signed)
   CC: Sore throat and runny nose  HPI:  [name] is a 37 y.o. male with history noted below that presents to the acute care clinic for 3 week history of sore throat and nasal congestion. Patient has tried over-the-counter sinus medication and NSAIDs with little benefit. Patient reports clear nasal congestion and a nonproductive cough. He denies fever/chills, nausea/vomiting or difficulty swallowing.  Past Medical History:  Diagnosis Date  . Syphilis     Review of Systems:  As per HPI  Physical Exam:  Vitals:   07/03/17 0935  BP: (!) 119/59  Pulse: 73  Temp: 98.5 F (36.9 C)  TempSrc: Oral  SpO2: 99%  Weight: 265 lb 8 oz (120.4 kg)  Height: 6' (1.829 m)   Physical Exam  Constitutional: He is well-developed, well-nourished, and in no distress.  HENT:  Mouth/Throat: Oropharynx is clear and moist. No oropharyngeal exudate.  Cardiovascular: Normal rate, regular rhythm and normal heart sounds. Exam reveals no gallop and no friction rub.  No murmur heard. Pulmonary/Chest: Effort normal and breath sounds normal. No respiratory distress. He has no wheezes. He has no rales.  Skin: Skin is warm and dry.     Assessment & Plan:   See encounters tab for problem based medical decision making.    Patient discussed with Dr. Cleda DaubE. Tamesha Ellerbrock

## 2017-07-03 NOTE — Assessment & Plan Note (Signed)
Assessment: Upper respiratory infection Patient states that for the past 3 weeks he has had nasal congestion and sore throat. He states that the nasal congestion is clear. He also has a nonproductive cough. She has tried over-the-counter sinus medication and Advil with little benefit. On exam no cervical lymphadenopathy noted, no oropharyngeal exudates. Lungs sounded clear. Suspect etiology is viral. Will treat conservatively with over-the-counter cough syrup and Tessalon Perles to help with cough. Also recommended a saline spray to help with nasal congestion and Chloraseptic spray/lozenges for sore throat. Patient already taking zyrtec.  Advised patient to return if symptoms do not improve or worsened.  Plan -Treat conservatively with Tessalon Perles, nasal saline spray and Chloraseptic spray/lozenges   -return to clinic if symptoms do not improve or worsen

## 2017-07-10 ENCOUNTER — Emergency Department (HOSPITAL_COMMUNITY)
Admission: EM | Admit: 2017-07-10 | Discharge: 2017-07-10 | Disposition: A | Discharge status: Home / Self Care | Payer: Medicaid Other | Attending: Emergency Medicine | Admitting: Emergency Medicine

## 2017-07-10 ENCOUNTER — Encounter (HOSPITAL_COMMUNITY): Payer: Self-pay | Admitting: Emergency Medicine

## 2017-07-10 DIAGNOSIS — J020 Streptococcal pharyngitis: Secondary | ICD-10-CM | POA: Insufficient documentation

## 2017-07-10 DIAGNOSIS — Z79899 Other long term (current) drug therapy: Secondary | ICD-10-CM | POA: Diagnosis not present

## 2017-07-10 DIAGNOSIS — J45909 Unspecified asthma, uncomplicated: Secondary | ICD-10-CM | POA: Diagnosis not present

## 2017-07-10 DIAGNOSIS — Z87891 Personal history of nicotine dependence: Secondary | ICD-10-CM | POA: Insufficient documentation

## 2017-07-10 DIAGNOSIS — J029 Acute pharyngitis, unspecified: Secondary | ICD-10-CM | POA: Diagnosis present

## 2017-07-10 LAB — RAPID STREP SCREEN (MED CTR MEBANE ONLY): Streptococcus, Group A Screen (Direct): POSITIVE — AB

## 2017-07-10 MED ORDER — ACETAMINOPHEN 325 MG PO TABS
650.0000 mg | ORAL_TABLET | Freq: Once | ORAL | Status: AC | PRN
  Administered 2017-07-10: 650 mg via ORAL
  Filled 2017-07-10: qty 2

## 2017-07-10 MED ORDER — PENICILLIN G BENZATHINE 1200000 UNIT/2ML IM SUSP
1.2000 10*6.[IU] | Freq: Once | INTRAMUSCULAR | Status: AC
Start: 2017-07-10 — End: 2017-07-10
  Administered 2017-07-10: 1.2 10*6.[IU] via INTRAMUSCULAR
  Filled 2017-07-10: qty 2

## 2017-07-10 NOTE — ED Triage Notes (Signed)
Pt reports sore throat x3 weeks, seen by PCP already for same. States pain continues. Muffled voice, some swelling noted to face.

## 2017-07-10 NOTE — Discharge Instructions (Signed)
Drink plenty of fluids, cold liquids will help numb the throat.  You can gargle with salt water for comfort.  Take over-the-counter sore throat lozenges for comfort.  You can take ibuprofen 600 mg plus acetaminophen 1000 mg every 6 hours as needed for pain.  Recheck if you are unable to swallow or have difficulty breathing.

## 2017-07-10 NOTE — ED Provider Notes (Signed)
[facility]   CSN: [id number] Arrival date & time: 07/10/17  0345  Time seen 05:35 AM (not in room at 05:03)   History   Chief Complaint Chief Complaint  Patient presents with  . Sore Throat    HPI Barkley BrunsLamont E Tauer is a 37 y.o. male.  HPI history is a little bit difficult due to patient having underlying stuttering disorder.  He relates about a month ago he developed cough that was dry, clear rhinorrhea and sneezing and pain in his upper chest that was not pleuritic.  The chest pain is been gone for several days.  He also complains of a sore throat and states is hard to swallow.  However he states he does not feel like he is dehydrated.  He denies his tongue being dry or having decreased urinary output.  He denies fever or chills.  He was seen last week by his PCP and was placed on Tessalon Perles and given an inhaler.  He states he was wheezing at that time but is not wheezing today.  He reports his nephew had strep throat about 2 weeks ago.  He denies nausea, vomiting, or diarrhea.  When I ask his family if his neck looks swollen they say may be a little bit but they state his face basically looks like his usual.  PCP Lanelle BalHarbrecht, Lawrence, MD   Past Medical History:  Diagnosis Date  . Syphilis     Patient Active Problem List   Diagnosis Date Noted  . Upper respiratory tract infection 07/03/2017  . Asthma 06/08/2017  . Rib pain on right side 03/09/2017  . Bilateral lower extremity edema 02/06/2017  . Obesity (BMI 30-39.9) 01/11/2017  . Anxiety 01/11/2017  . Insomnia 01/11/2017  . 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 WRIST SURGERY         Home Medications    Prior to Admission medications   Medication Sig Start Date End Date Taking? Authorizing Provider  albuterol (PROVENTIL HFA;VENTOLIN HFA) 108 (90 Base) MCG/ACT inhaler Inhale 2 puffs into the lungs every 6 (six) hours  as needed for wheezing or shortness of breath. 06/08/17  Yes Molt, Bethany, DO  benzonatate (TESSALON) 100 MG capsule Take 1 capsule (100 mg total) by mouth 3 (three) times daily as needed for cough. 07/03/17  Yes Hoffman, Jessica Ratliff, DO  cetirizine (ZYRTEC) 10 MG tablet Take 1 tablet (10 mg total) by mouth daily. Patient taking differently: Take 10 mg by mouth daily as needed for allergies.  06/08/17  Yes Molt, Bethany, DO  LORazepam (ATIVAN) 1 MG tablet Take 1 mg by mouth 2 (two) times daily.   Yes [provider]  sodium chloride (OCEAN) 0.65 % SOLN nasal spray Place 1 spray into both nostrils as needed for congestion. 07/03/17  Yes Hoffman, Jessica Ratliff, DO  zolpidem (AMBIEN) 10 MG tablet Take 10 mg by mouth at bedtime as needed for sleep.   Yes [provider]    Family History Family History  Problem Relation Age of Onset  . Hypertension Father   . Hypertension Mother   . Arthritis Mother     Social History Social History   Tobacco Use  . Smoking status: Former Smoker    Packs/day: 0.50    Types: Cigarettes  . Smokeless tobacco: Never Used  Substance Use Topics  . Alcohol use: No  . Drug use: No  on disability   Allergies  Patient has no known allergies.   Review of Systems Review of Systems  All other systems reviewed and are negative.    Physical Exam Updated Vital Signs BP 139/78 (BP Location: Right Arm)   Pulse 88   Temp 100.3 F (37.9 C) (Oral)   Resp 18   Ht 6' (1.829 m)   Wt 120.2 kg (265 lb)   SpO2 99%   BMI 35.94 kg/m   Vital signs normal except for a low-grade temp   Physical Exam  Constitutional: He is oriented to person, place, and time. He appears well-developed and well-nourished.  Non-toxic appearance. He does not appear ill. No distress.  HENT:  Head: Normocephalic and atraumatic.  Right Ear: External ear normal.  Left Ear: External ear normal.  Nose: Nose normal. No mucosal edema or rhinorrhea.    Mouth/Throat: Uvula is midline and mucous membranes are normal. No dental abscesses or uvula swelling. Posterior oropharyngeal erythema present. No oropharyngeal exudate or posterior oropharyngeal edema.  Speech is difficult to understand due to stuttering  Eyes: Conjunctivae and EOM are normal. Pupils are equal, round, and reactive to light.  Neck: Normal range of motion and full passive range of motion without pain. Neck supple.  Cardiovascular: Normal rate, regular rhythm and normal heart sounds. Exam reveals no gallop and no friction rub.  No murmur heard. Pulmonary/Chest: Effort normal and breath sounds normal. No respiratory distress. He has no wheezes. He has no rhonchi. He has no rales. He exhibits no tenderness and no crepitus.  Abdominal: Soft. Normal appearance and bowel sounds are normal. He exhibits no distension. There is no tenderness. There is no rebound and no guarding.  Musculoskeletal: Normal range of motion. He exhibits no edema or tenderness.  Moves all extremities well.   Lymphadenopathy:    He has no cervical adenopathy.  Neurological: He is alert and oriented to person, place, and time. He has normal strength. No cranial nerve deficit.  Skin: Skin is warm, dry and intact. No rash noted. No erythema. No pallor.  Psychiatric: He has a normal mood and affect. His speech is normal and behavior is normal. His mood appears not anxious.  Nursing note and vitals reviewed.    ED Treatments / Results  Labs (all labs ordered are listed, but only abnormal results are displayed) Labs Reviewed  RAPID STREP SCREEN (NOT AT [facility]) - Abnormal; Notable for the following components:      Result Value   Streptococcus, Group A Screen (Direct) POSITIVE (*)    All other components within normal limits   Laboratory interpretation all normal except positive strep screen   EKG  EKG Interpretation None       Radiology No results found.  Procedures Procedures (including critical  care time)  Medications Ordered in ED Medications  acetaminophen (TYLENOL) tablet 650 mg (650 mg Oral Given 07/10/17 0404)  penicillin g benzathine (BICILLIN LA) 1200000 UNIT/2ML injection 1.2 Million Units (1.2 Million Units Intramuscular Given 07/10/17 0602)     Initial Impression / Assessment and Plan / ED Course  I have reviewed the triage vital signs and the nursing notes.  Pertinent labs & imaging results that were available during my care of the patient were reviewed by me and considered in my medical decision making (see chart for details).     We discussed treatment of his strep pharyngitis.  Patient has elected to do the Bicillin IM.  We discussed not letting anyone share his utensils and to not to kiss anybody until  he has been treated for at least 48 hours.  He was advised to drink plenty of cold liquids so he does not get dehydrated.  At this point I do not see any reason to do any other studies such as a CT scan.  His family states his face and neck appear normal to them.    Final Clinical Impressions(s) / ED Diagnoses   Final diagnoses:  Strep pharyngitis    ED Discharge Orders    None    OTC ibuprofen and acetaminophen  Plan discharge  Devoria Albe, MD, Concha Pyo, MD 07/10/17 [phone]

## 2017-07-30 ENCOUNTER — Other Ambulatory Visit: Payer: Self-pay | Admitting: Internal Medicine

## 2017-08-27 ENCOUNTER — Encounter: Payer: Self-pay | Admitting: Occupational Therapy

## 2017-08-27 DIAGNOSIS — M6281 Muscle weakness (generalized): Secondary | ICD-10-CM

## 2017-08-27 NOTE — Therapy (Signed)
[facility] [address] [place], [place], [address] Phone: [phone]   Fax:  [phone]  Occupational Therapy Treatment  Patient Details  Name: Eric Stephens MRN: [id number] Date of Birth: [birth year] Referring Provider: Maryelizabeth Rowan, MD   Encounter Date: 08/27/2017    Past Medical History:  Diagnosis Date  . Syphilis     Past Surgical History:  Procedure Laterality Date  . WRIST SURGERY      There were no vitals filed for this visit.                             OT Long Term Goals - 08/27/17 0907      OT LONG TERM GOAL #1   Title  Pt and family will be mod I with HEP to address coordination/grip strength in L hand - 12/18/2016    Baseline  dependent    Status  Unable to assess      OT LONG TERM GOAL #2   Title  Pt will demonstrate improved grip strength by 5 pounds to assist with functional tasks (basline LUE= 20 pounds)    Baseline  20 pounds    Status  Unable to assess      OT LONG TERM GOAL #3   Title  Pt will demonstrate improved coordination as evidenced by decreasing time on 9 hole peg by at least 8 seconds to assist with functional tasks (baseline LUE= 57.80)    Baseline  57.80    Status  Unable to assess      OT LONG TERM GOAL #4   Title  Pt will be supevision for simple hot familiar meal prep    Baseline  dependent    Status  Unable to assess      OT LONG TERM GOAL #5   Title  Pt and family will verbalize understanding of voc rehab services.     Baseline  unable    Status  Unable to assess      OT LONG TERM GOAL #6   Title  Pt will need no more than min vc's for simple problem solving during functional, familiar activities.     Baseline  max cues    Status  Unable to assess            Plan - 08/27/17 0907    Clinical Impression Statement  Pt did not return to therapy after eval therefore will d/c from OT at this time.       Patient will benefit from  skilled therapeutic intervention in order to improve the following deficits and impairments:     Visit Diagnosis: Muscle weakness (generalized)    Problem List Patient Active Problem List   Diagnosis Date Noted  . Upper respiratory tract infection 07/03/2017  . Asthma 06/08/2017  . Rib pain on right side 03/09/2017  . Bilateral lower extremity edema 02/06/2017  . Obesity (BMI 30-39.9) 01/11/2017  . Anxiety 01/11/2017  . Insomnia 01/11/2017  . Neurosyphilis 01/08/2017   OCCUPATIONAL THERAPY DISCHARGE SUMMARY  Visits from Start of Care: 1  Current functional level related to goals / functional outcomes: See eval pt did not return to therapy   Remaining deficits: See eval   Education / Equipment: N/a pt did not return after eval  Plan: Patient agrees to discharge.  Patient goals were not met. Patient is being discharged due to not returning since the last visit.  ?????  Quay Burow, OTR/L 08/27/2017, 9:08 AM  [facility] [address] [place], [place], [address] Phone: [phone]   Fax:  [phone]  Name: Eric Stephens MRN: [id number] Date of Birth: June 16, 1981

## 2017-08-28 ENCOUNTER — Other Ambulatory Visit: Payer: Self-pay | Admitting: Internal Medicine

## 2017-09-01 ENCOUNTER — Other Ambulatory Visit: Payer: Self-pay | Admitting: Internal Medicine

## 2017-09-03 NOTE — Telephone Encounter (Signed)
Please have Eric Stephens Schedule with me for refills.  Thank you.

## 2017-09-12 ENCOUNTER — Other Ambulatory Visit: Payer: Self-pay | Admitting: Internal Medicine

## 2017-09-14 NOTE — Telephone Encounter (Signed)
Received another refill request from pt's pharmacy for Proventil.  Pt is scheduled to see pcp on 10/31/2017 (1st avail). Will have pcp review info and deny or approve request as appropriate.  Please advise.Criss Alvine.Abbigaile Rockman Cassady3/29/201911:55 AM

## 2017-09-19 ENCOUNTER — Other Ambulatory Visit: Payer: Self-pay | Admitting: Internal Medicine

## 2017-09-29 IMAGING — CR DG CHEST 2V
2 series · 2 of 2 positions shown · non-contrast
Comparison: None.

CLINICAL DATA: Smoker.  Altered mental status.  History of HIV.

EXAM:
CHEST  2 VIEW

[w chest pa]
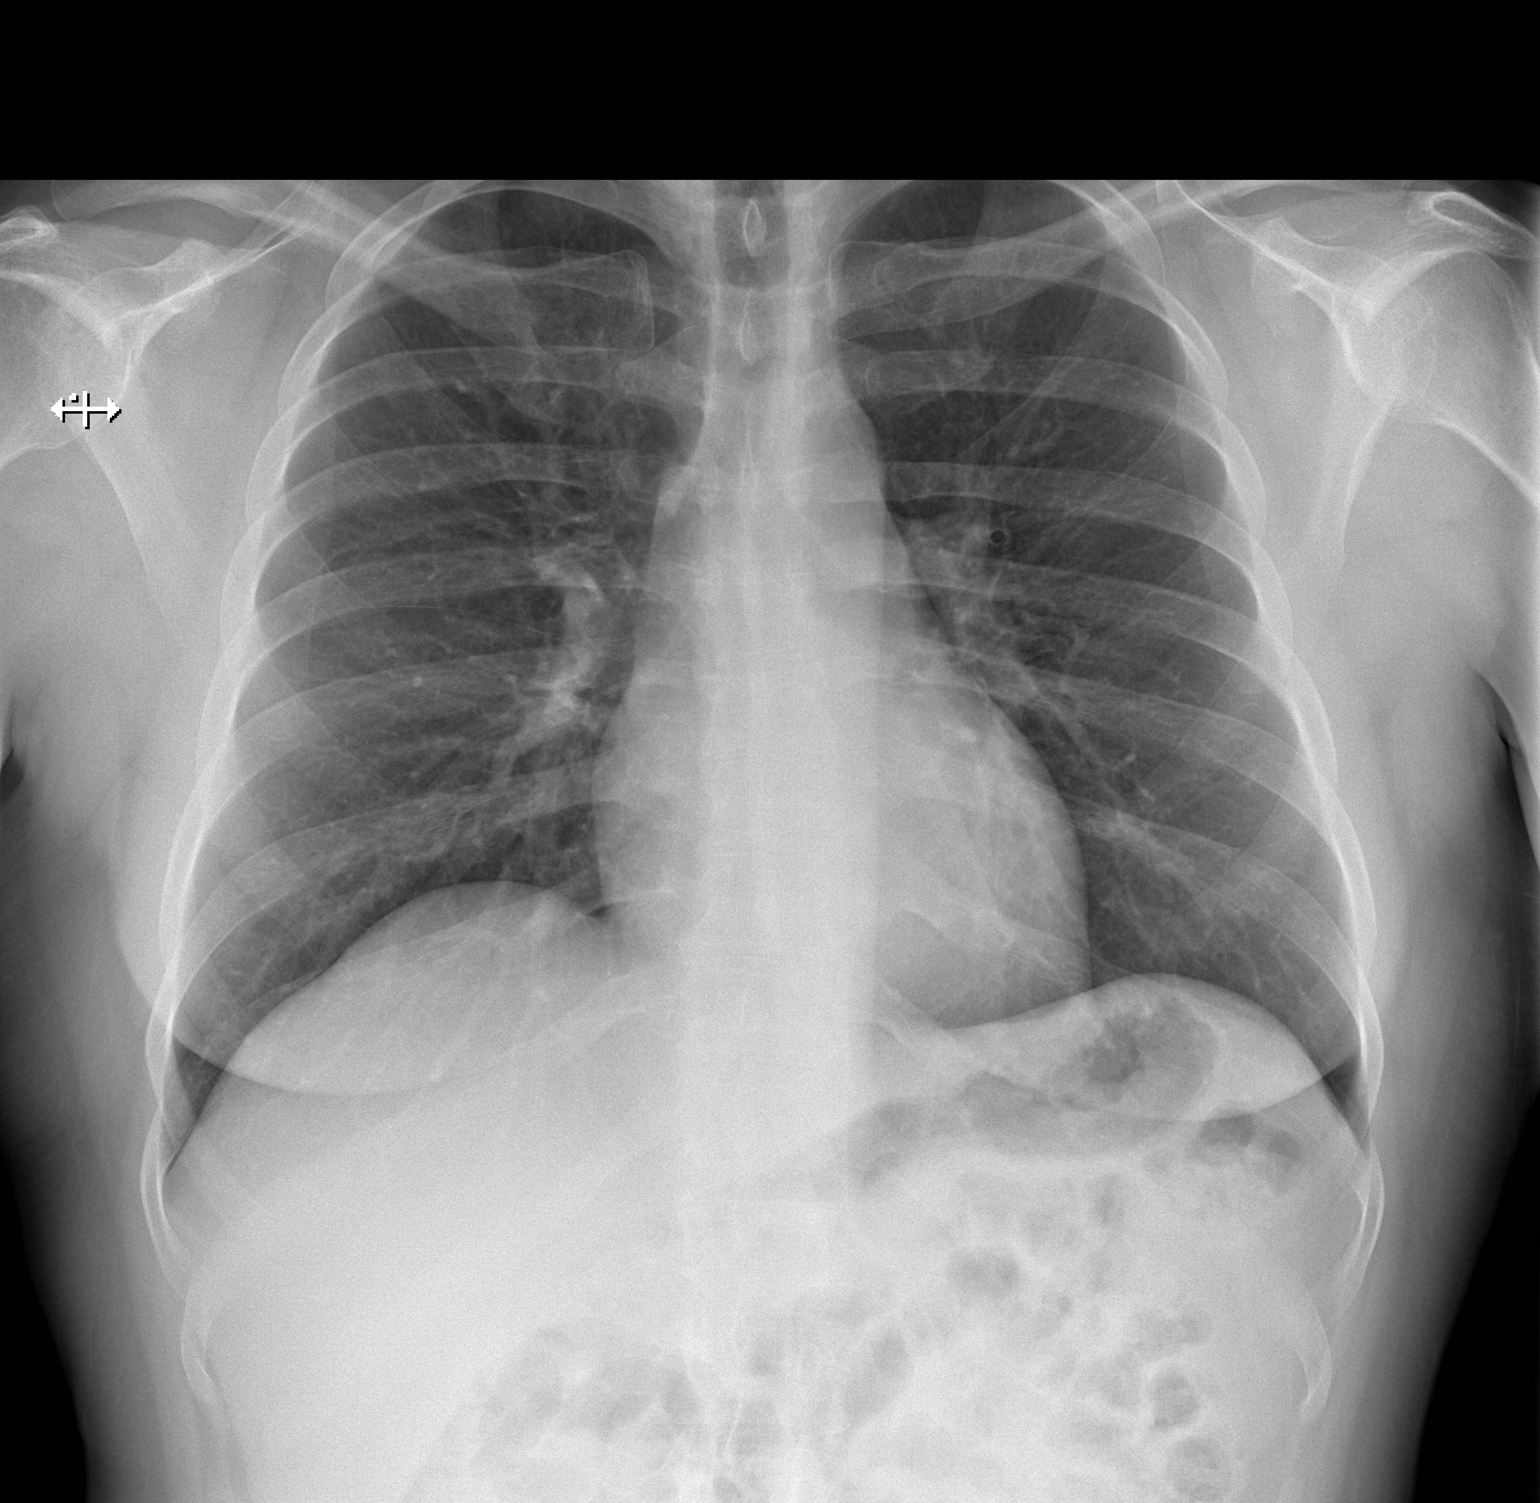

[w chest lat]
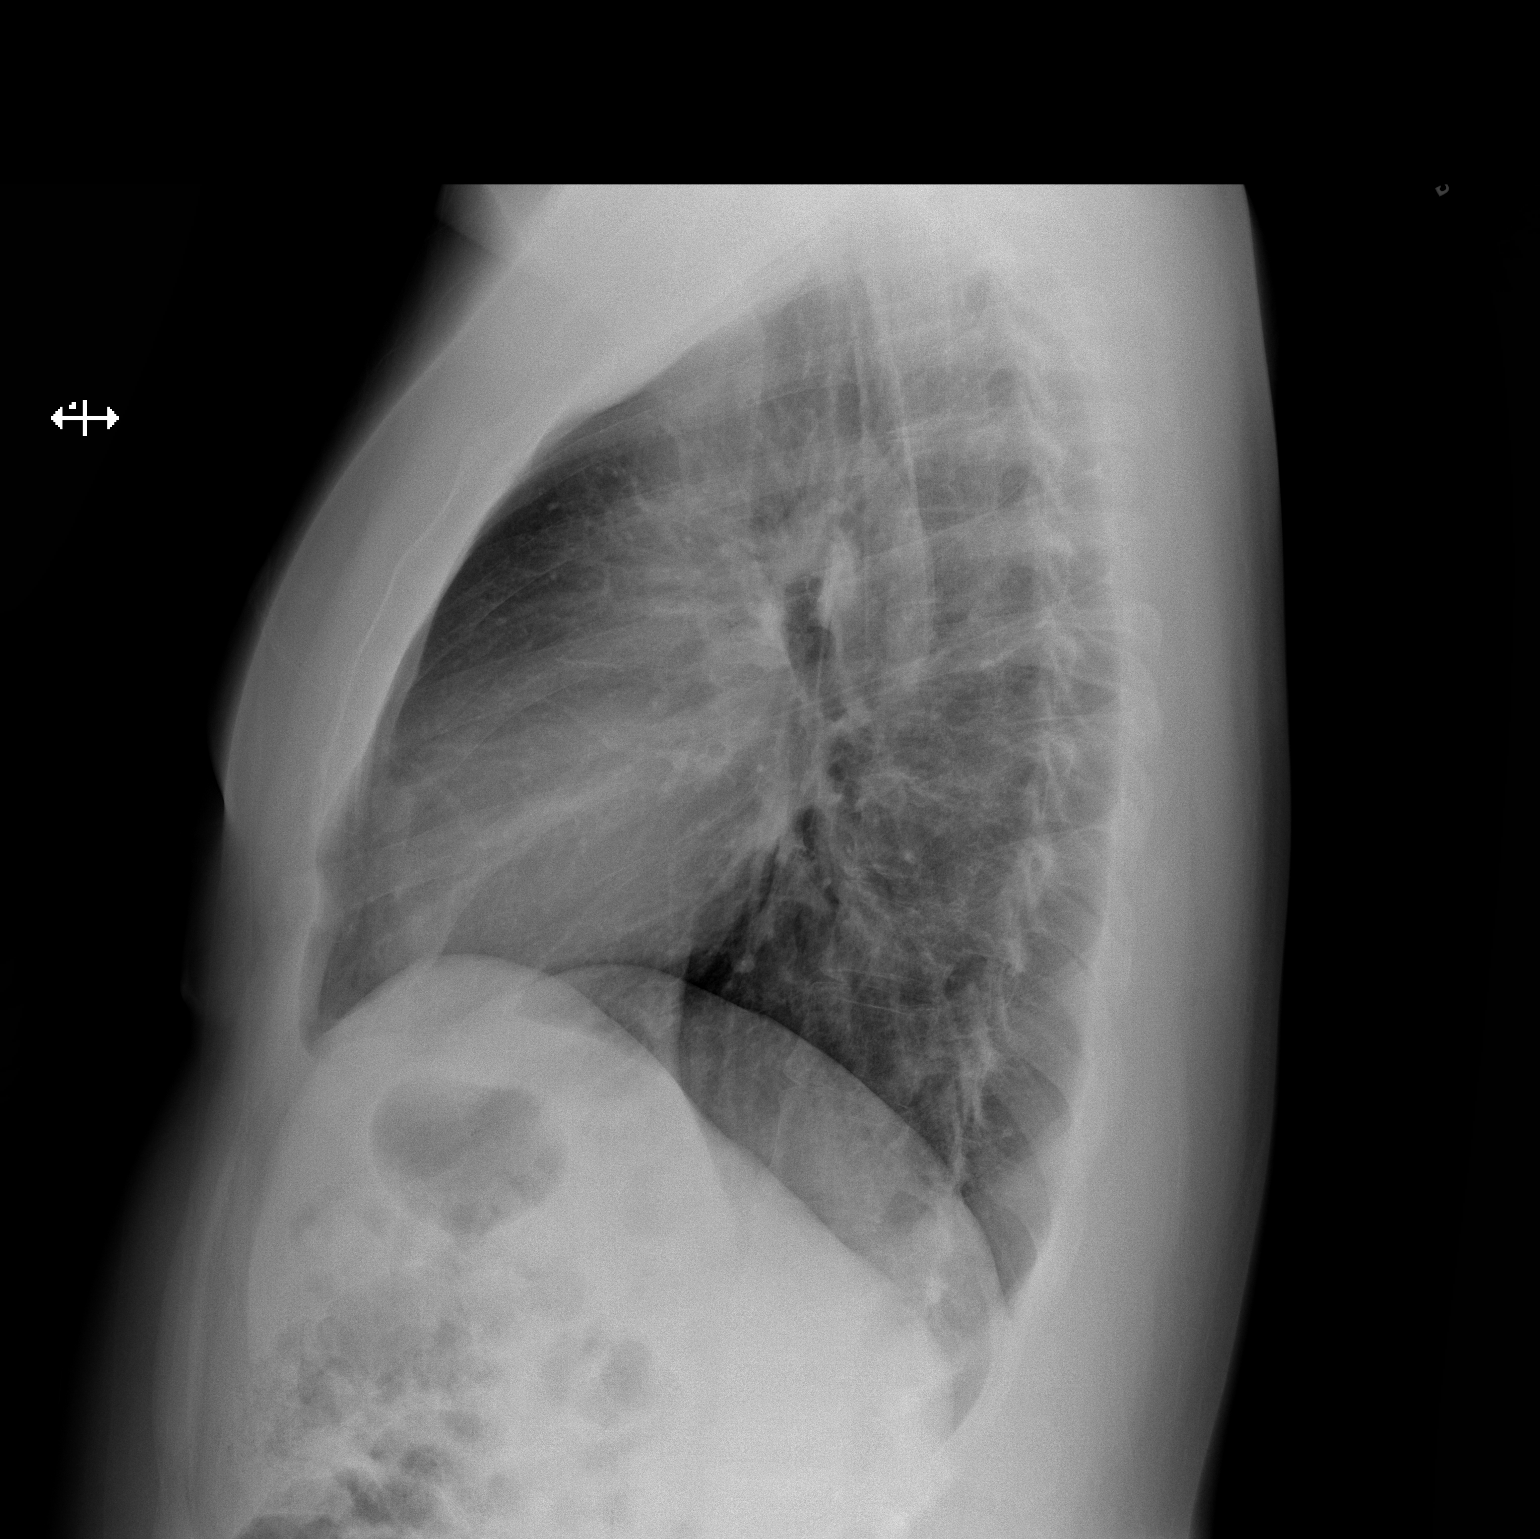

[2 of 2 positions shown; findings below may reference images not displayed]

FINDINGS: The heart size and mediastinal contours are within normal limits.
Both lungs are clear. The visualized skeletal structures are
unremarkable.
IMPRESSION: No active cardiopulmonary disease.

## 2017-10-23 ENCOUNTER — Encounter (HOSPITAL_COMMUNITY): Payer: Self-pay | Admitting: Emergency Medicine

## 2017-10-23 ENCOUNTER — Ambulatory Visit (HOSPITAL_COMMUNITY)
Admission: EM | Admit: 2017-10-23 | Discharge: 2017-10-23 | Disposition: A | Discharge status: Home / Self Care | Payer: Medicaid Other | Attending: Family Medicine | Admitting: Family Medicine

## 2017-10-23 DIAGNOSIS — M545 Low back pain, unspecified: Secondary | ICD-10-CM

## 2017-10-23 DIAGNOSIS — M62830 Muscle spasm of back: Secondary | ICD-10-CM

## 2017-10-23 MED ORDER — NAPROXEN 375 MG PO TABS: 375.0000 mg | ORAL_TABLET | Freq: Two times a day (BID) | ORAL | 0 refills | Status: DC

## 2017-10-23 MED ORDER — CYCLOBENZAPRINE HCL 10 MG PO TABS: 10.0000 mg | ORAL_TABLET | Freq: Two times a day (BID) | ORAL | 0 refills | Status: DC | PRN

## 2017-10-23 NOTE — ED Triage Notes (Signed)
Pt sts lower back pain denies obvious injury

## 2017-10-23 NOTE — ED Provider Notes (Signed)
[facility]    CSN: [id number] Arrival date & time: 10/23/17  1020     History   Chief Complaint Chief Complaint  Patient presents with  . Back Pain    HPI Eric Stephens is a 37 y.o. male.   Complains of back pain that began 1 day weeks ago.  He denies a precipitating event or specific injury.  He localizes the pain to the LT low back.  He describes the pain as constant and sharp in character.  He has tried OTC ibuprofen without relief.  His symptoms are made worse with twisting ROM.  He denies similar symptoms in the past.       Past Medical History:  Diagnosis Date  . Syphilis     Patient Active Problem List   Diagnosis Date Noted  . Upper respiratory tract infection 07/03/2017  . Asthma 06/08/2017  . Rib pain on right side 03/09/2017  . Bilateral lower extremity edema 02/06/2017  . Obesity (BMI 30-39.9) 01/11/2017  . Anxiety 01/11/2017  . Insomnia 01/11/2017  . 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 WRIST SURGERY         Home Medications    Prior to Admission medications   Medication Sig Start Date End Date Taking? Authorizing Provider  benzonatate (TESSALON) 100 MG capsule Take 1 capsule (100 mg total) by mouth 3 (three) times daily as needed for cough. 07/03/17   Camelia Phenes, DO  cetirizine (ZYRTEC) 10 MG tablet Take 1 tablet (10 mg total) by mouth daily. Patient taking differently: Take 10 mg by mouth daily as needed for allergies.  06/08/17   Molt, Bethany, DO  [employer] SALINE NOSE SPRAY 0.65 % nasal spray PLACE 1 SPRAY INTO BOTH NOSTRILS AS NEEDED FOR CONGESTION. 07/30/17   Lanelle Bal, MD  cyclobenzaprine (FLEXERIL) 10 MG tablet Take 1 tablet (10 mg total) by mouth 2 (two) times daily as needed for muscle spasms. 10/23/17   Victoria Henshaw, [place], PA-C  LORazepam (ATIVAN) 1 MG tablet Take 1 mg by mouth 2 (two) times daily.    [provider]  naproxen (NAPROSYN) 375 MG tablet Take 1  tablet (375 mg total) by mouth 2 (two) times daily. 10/23/17   Deep Bonawitz, [place], PA-C  PROVENTIL HFA 108 (90 Base) MCG/ACT inhaler TAKE 2 PUFFS BY MOUTH EVERY 6 HOURS AS NEEDED FOR WHEEZE OR SHORTNESS OF BREATH 09/15/17   Lanelle Bal, MD  zolpidem (AMBIEN) 10 MG tablet Take 10 mg by mouth at bedtime as needed for sleep.    [provider]    Family History Family History  Problem Relation Age of Onset  . Hypertension Father   . Hypertension Mother   . Arthritis Mother     Social History Social History   Tobacco Use  . Smoking status: Former Smoker    Packs/day: 0.50    Types: Cigarettes  . Smokeless tobacco: Never Used  Substance Use Topics  . Alcohol use: No  . Drug use: No     Allergies   Patient has no known allergies.   Review of Systems Review of Systems  Constitutional: Negative for chills and fever.  Respiratory: Negative for shortness of breath.   Cardiovascular: Negative for chest pain.  Gastrointestinal: Negative for abdominal pain, nausea and vomiting.  Genitourinary: Negative for difficulty urinating, dysuria, flank pain and hematuria.  Musculoskeletal: Positive for back pain. Negative for gait problem.  Neurological: Negative for weakness and numbness.  Physical Exam Triage Vital Signs ED Triage Vitals [10/23/17 1122]  Enc Vitals Group     BP (!) 149/102     Pulse Rate 64     Resp 18     Temp 98.6 F (37 C)     Temp Source Oral     SpO2 100 %     Weight      Height      Head Circumference      Peak Flow      Pain Score      Pain Loc      Pain Edu?      Excl. in GC?    No data found.  Updated Vital Signs BP 115/69 (BP Location: Right Arm)   Pulse 65   Temp 98.6 F (37 C) (Oral)   Resp 18   SpO2 98%    Physical Exam  Constitutional: He is oriented to person, place, and time. He appears well-developed and well-nourished. No distress.  HENT:  Head: Normocephalic and atraumatic.  Right Ear: External ear normal.    Left Ear: External ear normal.  Nose: Nose normal.  Mouth/Throat: Oropharynx is clear and moist. No oropharyngeal exudate.  Eyes: Pupils are equal, round, and reactive to light. EOM are normal. No scleral icterus.  Neck: Normal range of motion. Neck supple.  Cardiovascular: Normal rate, regular rhythm and normal heart sounds. Exam reveals no friction rub.  No murmur heard. Radial pulse 2+ bilaterally   Pulmonary/Chest: Effort normal and breath sounds normal. No stridor. No respiratory distress. He has no wheezes.  Abdominal: Soft. Bowel sounds are normal. There is no tenderness. There is no guarding.  Negative CVA tenderness  Musculoskeletal:  Back:  Patient ambulates from chair to exam table without difficulty.  Inspection: Skin clear and intact without obvious swelling, erythema, or ecchymosis. Warm to the touch  Palpation: Vertebral processes nontender. Tenderness about the lower left paravertebral muscles  ROM: FROM Strength: 5/5 hip flexion, 5/5 knee extension, 5/5 knee flexion, 5/5 plantar flexion, 5/5 dorsiflexion  DTR: Patellar tendon reflex intact  Special Tests: Negative Straight leg raise  Lymphadenopathy:    He has no cervical adenopathy.  Neurological: He is alert and oriented to person, place, and time. He displays normal reflexes. No sensory deficit.  Patient reports hx of Mild LT hand tremor at rest.   Skin: Skin is warm and dry. Capillary refill takes less than 2 seconds. He is not diaphoretic.  Psychiatric: He has a normal mood and affect.  Nursing note and vitals reviewed.    UC Treatments / Results  Labs (all labs ordered are listed, but only abnormal results are displayed) Labs Reviewed - No data to display  EKG None  Radiology No results found.  Procedures Procedures (including critical care time)  Medications Ordered in UC Medications - No data to display  Initial Impression / Assessment and Plan / UC Course  I have reviewed the triage vital  signs and the nursing notes.  Pertinent labs & imaging results that were available during my care of the patient were reviewed by me and considered in my medical decision making (see chart for details).     Complaining of left sided low back pain.  Hx, symptoms, and PE consistent with back spasm.  Prescribed naproxen and flexeril.  Instructed patient to rest, ice, heat and gentle stretches.  Will return or follow up with PCP if symptoms persists.  New or worsening symptoms will return or go to ER.  Final Clinical Impressions(s) / UC Diagnoses   Final diagnoses:  Acute left-sided low back pain without sciatica  Muscle spasm of back     Discharge Instructions     Continue conservative management of rest, ice, and gentle stretches Take naproxen as needed for pain relief (may cause abdominal discomfort, ulcers, and GI bleeds avoid taking with other NSAIDs) Take cyclobenzaprine at nighttime for symptomatic relief. Avoid driving or operating heavy machinery while using medication. Follow up with PCP if symptoms persist Return or go to the ER if you have any new or worsening symptoms (fever, chills, chest pain, abdominal pain, changes in bowel or bladder habits, pain radiating into lower legs, etc...)     ED Prescriptions    Medication Sig Dispense Auth. Provider   naproxen (NAPROSYN) 375 MG tablet Take 1 tablet (375 mg total) by mouth 2 (two) times daily. 20 tablet Masen Salvas, [place], PA-C   cyclobenzaprine (FLEXERIL) 10 MG tablet Take 1 tablet (10 mg total) by mouth 2 (two) times daily as needed for muscle spasms. 20 tablet Alvino Chapel, [place], PA-C     Controlled Substance Prescriptions [place] Controlled Substance Registry consulted? Not Applicable   Rennis Harding, [place] 10/23/17 1207

## 2017-10-23 NOTE — Discharge Instructions (Addendum)
Continue conservative management of rest, ice, and gentle stretches °Take naproxen as needed for pain relief (may cause abdominal discomfort, ulcers, and GI bleeds avoid taking with other NSAIDs) °Take cyclobenzaprine at nighttime for symptomatic relief. Avoid driving or operating heavy machinery while using medication. °Follow up with PCP if symptoms persist °Return or go to the ER if you have any new or worsening symptoms (fever, chills, chest pain, abdominal pain, changes in bowel or bladder habits, pain radiating into lower legs, etc...)  °

## 2017-10-31 ENCOUNTER — Encounter: Payer: Self-pay | Admitting: Internal Medicine

## 2017-10-31 ENCOUNTER — Ambulatory Visit: Payer: Medicaid Other | Admitting: Internal Medicine

## 2017-10-31 VITALS — BP 91/68 | HR 60 | Temp 99.0°F | Wt 253.6 lb

## 2017-10-31 DIAGNOSIS — L608 Other nail disorders: Secondary | ICD-10-CM | POA: Insufficient documentation

## 2017-10-31 DIAGNOSIS — Z8619 Personal history of other infectious and parasitic diseases: Secondary | ICD-10-CM | POA: Diagnosis not present

## 2017-10-31 DIAGNOSIS — A523 Neurosyphilis, unspecified: Secondary | ICD-10-CM | POA: Diagnosis not present

## 2017-10-31 DIAGNOSIS — J45909 Unspecified asthma, uncomplicated: Secondary | ICD-10-CM | POA: Diagnosis not present

## 2017-10-31 DIAGNOSIS — Z79899 Other long term (current) drug therapy: Secondary | ICD-10-CM | POA: Diagnosis not present

## 2017-10-31 DIAGNOSIS — B351 Tinea unguium: Secondary | ICD-10-CM | POA: Diagnosis not present

## 2017-10-31 DIAGNOSIS — J4530 Mild persistent asthma, uncomplicated: Secondary | ICD-10-CM

## 2017-10-31 NOTE — Patient Instructions (Addendum)
FOLLOW-UP INSTRUCTIONS When: In 6 months For: Routine visit What to bring: All of your medications   Thank you for the visit to [facility] consult was in clinic today.  I have ordered evaluation of the neurosyphilis as you requested and will call you when the results become available.  In addition I have placed a referral to podiatry to assist you with the toenail.

## 2017-10-31 NOTE — Progress Notes (Signed)
   CC: Routine evaluation for asthma   HPI:[name] is a 37 y.o. male who presents today for evaluation of his asthma and neurosyphilis.  Asthma: Stable, no flares in the past 3 months, has not needed his inhaler and that time. The patient denied cough, fever, chills, weakness, nausea, vomiting, diarrhea, constipation, chest pain, abdominal pain, myalgias or joint pain.  Plan: We will refill his inhalers as indicated.  Neurosyphilis: The patient has a history of neurosyphilis with an RPR positive, fluorescence positive and a titer of 1:16 in November 2018.  Repeat RPR was ordered following treatment but never performed by the patient.  He denies new symptoms. 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   Plan: We will repeat an RPR today as per patient request.   Onychomycosis of the right foot, 2nd digit: This is new as per patient.  We will place a referral to podiatry   Past Medical History:  Diagnosis Date  . Syphilis    Review of Systems:  ROS negative except as per HPI.  Physical Exam:  Vitals:   10/31/17 1344  BP: 91/68  Pulse: 60  Temp: 99 F (37.2 C)  TempSrc: Oral  SpO2: 98%  Weight: 253 lb 9.6 oz (115 kg)   Physical Exam  Constitutional: He appears well-developed and well-nourished. No distress.  HENT:  Head: Normocephalic and atraumatic.  Eyes: Pupils are equal, round, and reactive to light. Conjunctivae and EOM are normal.  Neck: Normal range of motion. Neck supple.  Cardiovascular: Normal rate and regular rhythm.  No murmur heard. Pulmonary/Chest: Effort normal and breath sounds normal. No stridor. No respiratory distress.  Abdominal: Soft. Bowel sounds are normal. He exhibits no distension.  Musculoskeletal: He exhibits no edema.  Neurological: He is alert. He has normal strength. No cranial nerve deficit. He displays a negative Romberg sign.   Skin: Skin is warm. He is not diaphoretic.  Psychiatric: He has a normal mood and affect. His speech is rapid and/or pressured. He is hyperactive.  Vitals reviewed.  Assessment & Plan:   See Encounters Tab for problem based charting.  Patient discussed with Dr. Cleda Daub

## 2017-11-01 DIAGNOSIS — B351 Tinea unguium: Secondary | ICD-10-CM

## 2017-11-01 HISTORY — DX: Tinea unguium: B35.1

## 2017-11-01 NOTE — Assessment & Plan Note (Signed)
Onychomycosis of the right foot, 2nd digit: This is new as per patient.  We will place a referral to podiatry

## 2017-11-01 NOTE — Assessment & Plan Note (Signed)
Neurosyphilis: The patient has a history of neurosyphilis with an RPR positive, fluorescence positive and a titer of 1:16 in November 2018.  Repeat RPR was ordered following treatment but never performed by the patient.  He denies new symptoms. 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   Plan: We will repeat an RPR today as per patient request.

## 2017-11-01 NOTE — Assessment & Plan Note (Addendum)
Asthma: Stable, no flares in the past 3 months, has not needed his inhaler and that time. Plan: We will refill his inhalers as indicated. 

## 2017-11-03 LAB — RPR: RPR: REACTIVE — AB

## 2017-11-03 LAB — RPR, QUANT+TP ABS (REFLEX)
Rapid Plasma Reagin, Quant: 1:16 {titer} — ABNORMAL HIGH
TREPONEMA PALLIDUM AB: POSITIVE — AB

## 2017-11-05 NOTE — Progress Notes (Signed)
Internal Medicine Clinic Attending  Case discussed with Dr. Harbrecht at the time of the visit.  We reviewed the resident's history and exam and pertinent patient test results.  I agree with the assessment, diagnosis, and plan of care documented in the resident's note.   

## 2017-11-14 ENCOUNTER — Encounter (HOSPITAL_COMMUNITY): Payer: Self-pay | Admitting: Emergency Medicine

## 2017-11-14 ENCOUNTER — Emergency Department (HOSPITAL_COMMUNITY)
Admission: EM | Admit: 2017-11-14 | Discharge: 2017-11-14 | Disposition: A | Discharge status: Home / Self Care | Payer: Medicaid Other | Attending: Emergency Medicine | Admitting: Emergency Medicine

## 2017-11-14 DIAGNOSIS — J45909 Unspecified asthma, uncomplicated: Secondary | ICD-10-CM | POA: Diagnosis not present

## 2017-11-14 DIAGNOSIS — W500XXA Accidental hit or strike by another person, initial encounter: Secondary | ICD-10-CM | POA: Insufficient documentation

## 2017-11-14 DIAGNOSIS — Y939 Activity, unspecified: Secondary | ICD-10-CM | POA: Insufficient documentation

## 2017-11-14 DIAGNOSIS — Z87891 Personal history of nicotine dependence: Secondary | ICD-10-CM | POA: Insufficient documentation

## 2017-11-14 DIAGNOSIS — S0990XA Unspecified injury of head, initial encounter: Secondary | ICD-10-CM | POA: Diagnosis present

## 2017-11-14 DIAGNOSIS — Z79899 Other long term (current) drug therapy: Secondary | ICD-10-CM | POA: Diagnosis not present

## 2017-11-14 DIAGNOSIS — Y929 Unspecified place or not applicable: Secondary | ICD-10-CM | POA: Diagnosis not present

## 2017-11-14 DIAGNOSIS — Y998 Other external cause status: Secondary | ICD-10-CM | POA: Diagnosis not present

## 2017-11-14 NOTE — ED Notes (Signed)
PA at bedside.
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
## 2017-11-14 NOTE — Discharge Instructions (Signed)
You may continue to take Tylenol for the pain. You may also take ibuprofen or naproxen to help with the swelling. Ice will also help.

## 2017-11-15 ENCOUNTER — Telehealth: Payer: Self-pay | Admitting: Internal Medicine

## 2017-11-15 ENCOUNTER — Encounter: Payer: Self-pay | Admitting: Internal Medicine

## 2017-11-15 NOTE — Telephone Encounter (Signed)
Pt requesting lab results. Thanks  

## 2017-11-15 NOTE — Telephone Encounter (Signed)
PATIENT WOULD LIKE A

## 2017-11-15 NOTE — Telephone Encounter (Signed)
Patient wants lab results

## 2017-11-20 ENCOUNTER — Ambulatory Visit: Payer: Medicaid Other | Admitting: Sports Medicine

## 2017-11-20 ENCOUNTER — Encounter: Payer: Self-pay | Admitting: Sports Medicine

## 2017-11-20 DIAGNOSIS — M79676 Pain in unspecified toe(s): Secondary | ICD-10-CM | POA: Diagnosis not present

## 2017-11-20 DIAGNOSIS — B351 Tinea unguium: Secondary | ICD-10-CM | POA: Diagnosis not present

## 2017-11-20 DIAGNOSIS — A539 Syphilis, unspecified: Secondary | ICD-10-CM | POA: Insufficient documentation

## 2017-11-20 NOTE — Progress Notes (Signed)
Subjective: Eric Stephens is a 37 y.o. male patient seen today in office with complaint of mildly painful thickened and elongated toenails; unable to trim. Patient denies history of Diabetes, Neuropathy, or Vascular disease. Patient is assisted by dad who reports this history; has no other pedal complaints at this time.   Review of Systems  Unable to perform ROS: Mental acuity     Patient Active Problem List   Diagnosis Date Noted  . Syphilis 11/20/2017  . Onychomycosis of toenail 11/01/2017  . Upper respiratory tract infection 07/03/2017  . Asthma 06/08/2017  . Rib pain on right side 03/09/2017  . Bilateral lower extremity edema 02/06/2017  . Obesity (BMI 30-39.9) 01/11/2017  . Anxiety 01/11/2017  . Insomnia 01/11/2017  . Neurosyphilis 01/08/2017  . 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 Catatonia 07/04/2016  . Schizophrenia (HCC) 02/04/2016    Current Outpatient Medications on File Prior to Visit  Medication Sig Dispense Refill  . benzonatate (TESSALON) 100 MG capsule Take 1 capsule (100 mg total) by mouth 3 (three) times daily as needed for cough. 20 capsule 0  . cetirizine (ZYRTEC) 10 MG tablet Take 1 tablet (10 mg total) by mouth daily. (Patient taking differently: Take 10 mg by mouth daily as needed for allergies. ) 30 tablet 11  . [employer] SALINE NOSE SPRAY 0.65 % nasal spray PLACE 1 SPRAY INTO BOTH NOSTRILS AS NEEDED FOR CONGESTION. 30 mL 0  . cyclobenzaprine (FLEXERIL) 10 MG tablet Take 1 tablet (10 mg total) by mouth 2 (two) times daily as needed for muscle spasms. 20 tablet 0  . gabapentin (NEURONTIN) 600 MG tablet TAKE 1 TABLET BY MOUTH TWICE DAILY FOR ANXIETY AND IRRITABILITY  2  . LORazepam (ATIVAN) 1 MG tablet Take 1 mg by mouth 2 (two) times daily.    . naproxen (NAPROSYN) 375 MG tablet Take 1 tablet (375 mg total) by mouth 2 (two) times daily. 20 tablet 0  . PROVENTIL HFA 108 (90 Base) MCG/ACT inhaler TAKE 2 PUFFS BY MOUTH EVERY 6  HOURS AS NEEDED FOR WHEEZE OR SHORTNESS OF BREATH 6.7 Inhaler 1  . zolpidem (AMBIEN) 10 MG tablet Take 10 mg by mouth at bedtime as needed for sleep.     No current facility-administered medications on file prior to visit.     No Known Allergies  Objective: Physical Exam  General: Well developed, nourished, no acute distress, awake, alert and oriented x 2  Vascular: Dorsalis pedis artery 2/4 bilateral, Posterior tibial artery 2/4 bilateral, skin temperature warm to warm proximal to distal bilateral lower extremities, no varicosities, pedal hair present bilateral.  Neurological: Gross sensation present via light touch bilateral.   Dermatological: Skin is warm, dry, and supple bilateral, Nails 1-10 are tender, long, thick, and discolored with mild subungal debris, no webspace macerations present bilateral, no open lesions present bilateral, no callus/corns/hyperkeratotic tissue present bilateral. No signs of infection bilateral.  Musculoskeletal: Asymptomatic pes planus boney deformities noted bilateral. Muscular strength within normal limits without painon range of motion. No pain with calf compression bilateral.  Assessment and Plan:  Problem List Items Addressed This Visit    None    Visit Diagnoses    Pain due to onychomycosis of toenail    -  Primary      -Examined patient.  -Discussed treatment options for painful mycotic nails. -Mechanically debrided and reduced mycotic nails with sterile nail nipper and dremel nail file without incident. -Patient to return in 3 months for follow  up evaluation or sooner if symptoms worsen.  Landis Martins, DPM

## 2017-11-21 NOTE — Telephone Encounter (Signed)
Notified of results. IN agreement with retesting in 6 months as per ID recommendations.

## 2018-01-03 ENCOUNTER — Encounter

## 2018-01-31 NOTE — Progress Notes (Addendum)
[facility] Note  02/01/2018     CHIEF COMPLAINT Patient presents for Retina Evaluation   HISTORY OF PRESENT ILLNESS: Eric Stephens is a 37 y.o. male who presents to the clinic today for:   HPI    Retina Evaluation    In both eyes.  This started 1 week ago.  Associated Symptoms Negative for Flashes, Blind Spot, Photophobia, Scalp Tenderness, Fever, Pain, Glare, Floaters, Jaw Claudication, Weight Loss, Distortion, Redness, Trauma, Shoulder/Hip pain and Fatigue.  Context:  distance vision, mid-range vision and near vision.  Treatments tried include artificial tears.  Response to treatment was no improvement.  I, the attending physician,  performed the HPI with the patient and updated documentation appropriately.          Comments    Referral of Dr. Marin Comment for retina eval. Patient accompanied by father, Patient states he has had blurry vision ou for appx a week, his right eye started itching yesterday. Pt received new rx glasses in June. Denies flashes floaters and ocular pain, denies vit's,pt is using Dry Eye gtt's ou PRN.       Last edited by Bernarda Caffey, MD on 02/01/2018 11:34 AM. (History)    Pt states he was seen by Dr. 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 Maryjane Hurter for retinal evaluation LEE: 07.25.19 (M. Le) [BCVA: OD: 20/60- OS: 20/60-] Ocular Hx-cataracts, HTN ret PMH-syphilis, schizophrenia, former smoker    CURRENT MEDICATIONS: No current outpatient medications on file. (Ophthalmic Drugs)   No current facility-administered medications for this  visit.  (Ophthalmic Drugs)   Current Outpatient Medications (Other)  Medication Sig  . cetirizine (ZYRTEC) 10 MG tablet Take 1 tablet (10 mg total) by mouth daily. (Patient taking differently: Take 10 mg by mouth daily as needed for allergies. )  . gabapentin (NEURONTIN) 600 MG tablet TAKE 1 TABLET BY MOUTH TWICE DAILY FOR ANXIETY AND IRRITABILITY  . LORazepam (ATIVAN) 1 MG tablet Take 1 mg by mouth 2 (two) times daily.  . naproxen (NAPROSYN) 375 MG tablet Take 1 tablet (375 mg total) by mouth 2 (two) times daily.  [name] PROVENTIL HFA 108 (90 Base) MCG/ACT inhaler TAKE 2 PUFFS BY MOUTH EVERY 6 HOURS AS NEEDED FOR WHEEZE OR SHORTNESS OF BREATH  . zolpidem (AMBIEN) 10 MG tablet Take 10 mg by mouth at bedtime as needed for sleep.  . benzonatate (TESSALON) 100 MG capsule Take 1 capsule (100 mg total) by mouth 3 (three) times daily as needed for cough. (Patient not taking: Reported on 02/01/2018)  . CVS SALINE NOSE SPRAY 0.65 % nasal spray PLACE 1 SPRAY INTO BOTH NOSTRILS AS NEEDED FOR CONGESTION. (Patient not taking: Reported on 02/01/2018)  . cyclobenzaprine (FLEXERIL) 10 MG tablet Take 1 tablet (10 mg total) by mouth 2 (two) times daily as needed for muscle spasms. (Patient not taking: Reported on 02/01/2018)   No current facility-administered medications for this visit.  (Other)      REVIEW OF SYSTEMS: ROS    Positive for: Eyes   Negative for: Constitutional, Gastrointestinal, Neurological, Skin,  Genitourinary, Musculoskeletal, HENT, Endocrine, Cardiovascular, Respiratory, Psychiatric, Allergic/Imm, Heme/Lymph   Last edited by Zenovia Jordan, LPN on 1/76/1607  3:71 AM. (History)       ALLERGIES No Known Allergies  PAST MEDICAL HISTORY Past Medical History:  Diagnosis Date  . Syphilis    Past Surgical History:  Procedure Laterality Date  . WRIST SURGERY      FAMILY HISTORY Family History  Problem Relation Age of Onset  . Hypertension Father   . Hypertension Mother   . Arthritis  Mother     SOCIAL HISTORY Social History   Tobacco Use  . Smoking status: Current Some Day Smoker    Packs/day: 0.50    Types: Cigarettes  . Smokeless tobacco: Never Used  Substance Use Topics  . Alcohol use: No  . Drug use: No         OPHTHALMIC EXAM:  Base Eye Exam    Visual Acuity (Snellen - Linear)      Right Left   Dist cc 20/50 20/100   Dist ph cc NI 20/100 +3   Correction:  Glasses       Tonometry (Tonopen, 9:21 AM)      Right Left   Pressure 17 18       Pupils      Dark Light Shape React APD   Right 4 3 Round Brisk None   Left 4 3 Round Brisk None       Visual Fields (Counting fingers)      Left Right    Full Full       Extraocular Movement      Right Left    Full, Ortho Full, Ortho       Neuro/Psych    Oriented x3:  Yes   Mood/Affect:  Normal       Dilation    Both eyes:  1.0% Mydriacyl, 2.5% Phenylephrine @ 9:21 AM        Slit Lamp and Fundus Exam    Slit Lamp Exam      Right Left   Lids/Lashes Meibomian gland dysfunction Meibomian gland dysfunction   Conjunctiva/Sclera Melanosis Melanosis   Cornea Arcus Clear   Anterior Chamber Deep and quiet Deep and quiet   Iris Round and dilated Round and dilated   Lens 1+ Nuclear sclerosis, 2+ Cortical cataract 1+ Nuclear sclerosis, 2+ Cortical cataract   Vitreous Vitreous syneresis, prominenet vitreous base temporally Vitreous syneresis, prominent vitreous base        Fundus Exam      Right Left   Disc Temporal Pallor, Temporal Peripapillary atrophy Temporal Pallor, 360 Peripapillary atrophy   C/D Ratio 0.6 0.6   Macula Blunted foveal reflex, No heme or edema Blunted foveal reflex, RPE mottling and clumping   Vessels Vascular attenuation Vascular attenuation   Periphery Attached, inferiotemporal pigment changes, focal area of pigmented lattice at 0430, small operculated hole at 1030, prominent vitreous base Attached, Operculum at 0100 / 0115, small area of pigmented lattice at 0900,  prominent vitreous base          IMAGING AND PROCEDURES  Imaging and Procedures for '@TODAY'$ @  OCT, Retina - OU - Both Eyes       Right Eye Quality was good. Central Foveal Thickness: 270. Progression has no prior data. Findings include normal foveal contour, no IRF, no SRF, myopic contour.   Left Eye Quality was good. Central Foveal Thickness: 276. Progression has no prior data. Findings include no IRF, no SRF, normal foveal contour, myopic contour.  Notes *Images captured and stored on drive  Diagnosis / Impression:  Myopic contour; NFP; No IRF/SRF  Clinical management:  See below  Abbreviations: NFP - Normal foveal profile. CME - cystoid macular edema. PED - pigment epithelial detachment. IRF - intraretinal fluid. SRF - subretinal fluid. EZ - ellipsoid zone. ERM - epiretinal membrane. ORA - outer retinal atrophy. ORT - outer retinal tubulation. SRHM - subretinal hyper-reflective material         Fluorescein Angiography Optos (Transit OS)       Right Eye   Progression has no prior data. Early phase findings include normal observations. Mid/Late phase findings include normal observations.   Left Eye   Progression has no prior data. Early phase findings include normal observations. Mid/Late phase findings include normal observations.   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 Retinal edema H35.81 OCT, Retina - OU - Both Eyes    Fluorescein Angiography Optos (Transit OS)  5. 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 Hx of syphilis Z86.19     1,2. Lattice degeneration w/ atrophic holes, OU - OD: pigmented lattice at 0430, small operculated hole at 1030 - OS: operculum at 0100/0115,  small area of pigmented lattice at 0900 - discussed findings, prognosis, and treatment options including observation - recommend laser retinopexy OU - pt wishes to return for laser retinopexy -- reasonable - f/u 1 week for laser retinopexy  3. High myopia OU - under the expert refractive management of Dr. Marin Comment - OCT shows diffuse thinning of retina - discussed findings and prognosis associated with high myopia / degenerative myopia  4. No retinal edema on exam or OCT  5. Nuclear sclerosis OU-  - The symptoms of cataract, surgical options, and treatments and risks were discussed with patient. - discussed diagnosis and progression - not yet visually significant - monitor for now  6. History of Neurosyphilis - quant RPR 1:16 on 10/2017 from 1:32 in 2017 - FA on 8.16.19 - normal study without leakage, hyperfluorescence or vasculitis - no ocular involvement; no inflammation in anterior or posterior segments   Ophthalmic Meds Ordered this visit:  No orders of the defined types were placed in this encounter.      Return in about 1 week (around 02/08/2018) for F/U lattice degen OU, DFE, OCT.  There are no Patient Instructions on file for this visit.   Explained the diagnoses, plan, and follow up with the patient and they expressed understanding.  Patient expressed understanding of the importance of proper follow up care.   This document serves as a record of services personally performed by Gardiner Sleeper, MD, PhD. It was created on their behalf by Ernest Mallick, OA, an ophthalmic assistant. The creation of this record is the provider's dictation and/or activities during the visit.    Electronically signed by: Ernest Mallick, OA  08.15.2019 5:22 PM   This document serves as a record of services personally performed by Gardiner Sleeper, MD, PhD. It was created on their behalf by Catha Brow, [place], a certified ophthalmic assistant. The creation of this record is the provider's dictation  and/or activities during  the visit.  Electronically signed by: Catha Brow, COA  08.16.19 5:22 PM    Gardiner Sleeper, M.D., Ph.D. Diseases & Surgery of the Retina and Vitreous [facility]: M myopia (nearsighted); A astigmatism; H hyperopia (farsighted); P presbyopia; Mrx spectacle prescription;  CTL contact lenses; OD right eye; OS left eye; OU both eyes  XT exotropia; ET esotropia; PEK punctate epithelial keratitis; PEE punctate epithelial erosions; DES dry eye syndrome; MGD meibomian gland dysfunction; ATs artificial tears; PFAT's preservative free artificial tears; [place] nuclear sclerotic cataract; PSC posterior subcapsular cataract; ERM epi-retinal membrane; PVD posterior vitreous detachment; RD retinal detachment; DM diabetes mellitus; DR diabetic retinopathy; NPDR non-proliferative diabetic retinopathy; PDR proliferative diabetic retinopathy; CSME clinically significant macular edema; DME diabetic macular edema; dbh dot blot hemorrhages; CWS cotton wool spot; POAG primary open angle glaucoma; C/D cup-to-disc ratio; HVF humphrey visual field; GVF goldmann visual field; OCT optical coherence tomography; IOP intraocular pressure; BRVO Branch retinal vein occlusion; CRVO central retinal vein occlusion; CRAO central retinal artery occlusion; BRAO branch retinal artery occlusion; RT retinal tear; SB scleral buckle; PPV pars plana vitrectomy; VH Vitreous hemorrhage; PRP panretinal laser photocoagulation; IVK intravitreal kenalog; VMT vitreomacular traction; MH Macular hole;  NVD neovascularization of the disc; NVE neovascularization elsewhere; AREDS age related eye disease study; ARMD age related macular degeneration; POAG primary open angle glaucoma; EBMD epithelial/anterior basement membrane dystrophy; ACIOL anterior chamber intraocular lens; IOL intraocular lens; PCIOL posterior chamber intraocular lens; Phaco/IOL phacoemulsification with intraocular lens  placement; [place] photorefractive keratectomy; LASIK laser assisted in situ keratomileusis; HTN hypertension; DM diabetes mellitus; COPD chronic obstructive pulmonary disease

## 2018-02-01 ENCOUNTER — Ambulatory Visit (INDEPENDENT_AMBULATORY_CARE_PROVIDER_SITE_OTHER): Payer: Medicaid Other | Admitting: Ophthalmology

## 2018-02-01 ENCOUNTER — Encounter (INDEPENDENT_AMBULATORY_CARE_PROVIDER_SITE_OTHER): Payer: Self-pay | Admitting: Ophthalmology

## 2018-02-01 DIAGNOSIS — Z8619 Personal history of other infectious and parasitic diseases: Secondary | ICD-10-CM

## 2018-02-01 DIAGNOSIS — H3581 Retinal edema: Secondary | ICD-10-CM

## 2018-02-01 DIAGNOSIS — H35413 Lattice degeneration of retina, bilateral: Secondary | ICD-10-CM | POA: Diagnosis not present

## 2018-02-01 DIAGNOSIS — H3589 Other specified retinal disorders: Secondary | ICD-10-CM | POA: Diagnosis not present

## 2018-02-01 DIAGNOSIS — H2513 Age-related nuclear cataract, bilateral: Secondary | ICD-10-CM

## 2018-02-01 DIAGNOSIS — H5213 Myopia, bilateral: Secondary | ICD-10-CM

## 2018-02-05 NOTE — Progress Notes (Addendum)
[facility] Note  02/06/2018     CHIEF COMPLAINT Patient presents for Retina Follow Up   HISTORY OF PRESENT ILLNESS: Eric Stephens is a 37 y.o. male who presents to the clinic today for:   HPI    Retina Follow Up    Patient presents with  Other.  In both eyes.  This started 1 week ago.  Severity is mild.  Since onset it is stable.  I, the attending physician,  performed the HPI with the patient and updated documentation appropriately.          Comments    F/U Lattice degen. Patient states the itching is gone OD. Denies floaters,flashes and Ocular apin. Pt is ready for tx today.       Last edited by Bernarda Caffey, MD on 02/06/2018  8:28 AM. (History)       Referring physician: Kathi Ludwig, MD [place],  [id number]  HISTORICAL INFORMATION:   Selected notes from the MEDICAL RECORD NUMBER Referred by Dr. Maryjane Hurter for retinal evaluation LEE: 07.25.19 (M. Le) [BCVA: OD: 20/60- OS: 20/60-] Ocular Hx-cataracts, HTN ret PMH-syphilis, schizophrenia, former smoker    CURRENT MEDICATIONS: Current Outpatient Medications (Ophthalmic Drugs)  Medication Sig  . prednisoLONE acetate (PRED FORTE) 1 % ophthalmic suspension Place 1 drop into the right eye 4 (four) times daily for 7 days.   No current facility-administered medications for this visit.  (Ophthalmic Drugs)   Current Outpatient Medications (Other)  Medication Sig  . cetirizine (ZYRTEC) 10 MG tablet Take 1 tablet (10 mg total) by mouth daily. (Patient taking differently: Take 10 mg by mouth daily as needed for allergies. )  . CVS SALINE NOSE SPRAY 0.65 % nasal spray PLACE 1 SPRAY INTO BOTH NOSTRILS AS NEEDED FOR CONGESTION.  [name] gabapentin (NEURONTIN) 600 MG tablet TAKE 1 TABLET BY MOUTH TWICE DAILY FOR ANXIETY AND IRRITABILITY  . LORazepam (ATIVAN) 1 MG tablet Take 1 mg by mouth 2 (two) times daily.  . naproxen (NAPROSYN) 375 MG tablet Take 1 tablet (375 mg total) by mouth 2  (two) times daily.  [name] PROVENTIL HFA 108 (90 Base) MCG/ACT inhaler TAKE 2 PUFFS BY MOUTH EVERY 6 HOURS AS NEEDED FOR WHEEZE OR SHORTNESS OF BREATH  . zolpidem (AMBIEN) 10 MG tablet Take 10 mg by mouth at bedtime as needed for sleep.  . benzonatate (TESSALON) 100 MG capsule Take 1 capsule (100 mg total) by mouth 3 (three) times daily as needed for cough. (Patient not taking: Reported on 02/06/2018)  . cyclobenzaprine (FLEXERIL) 10 MG tablet Take 1 tablet (10 mg total) by mouth 2 (two) times daily as needed for muscle spasms. (Patient not taking: Reported on 02/06/2018)   No current facility-administered medications for this visit.  (Other)      REVIEW OF SYSTEMS: ROS    Positive for: Eyes   Negative for: Constitutional, Gastrointestinal, Neurological, Skin, Genitourinary, Musculoskeletal, HENT, Endocrine, Cardiovascular, Respiratory, Psychiatric, Allergic/Imm, Heme/Lymph   Last edited by Zenovia Jordan, LPN on 9/32/3557  3:22 AM. (History)       ALLERGIES No Known Allergies  PAST MEDICAL HISTORY Past Medical History:  Diagnosis Date  . Syphilis    Past Surgical History:  Procedure Laterality Date  . WRIST SURGERY      FAMILY HISTORY Family History  Problem Relation Age of Onset  . Hypertension Father   . Hypertension Mother   . Arthritis Mother     SOCIAL HISTORY Social History  Tobacco Use  . Smoking status: Current Some Day Smoker    Packs/day: 0.50    Types: Cigarettes  . Smokeless tobacco: Never Used  Substance Use Topics  . Alcohol use: No  . Drug use: No         OPHTHALMIC EXAM:  Base Eye Exam    Visual Acuity (Snellen - Linear)      Right Left   Dist cc 20/50 20/100   Dist ph cc NI 20/80   Correction:  Glasses       Tonometry (Tonopen, 8:28 AM)      Right Left   Pressure 18 18       Pupils      Dark Light Shape React APD   Right 4 3 Round Brisk None   Left 4 3 Round Brisk None       Visual Fields (Counting fingers)      Left Right     Full Full       Extraocular Movement      Right Left    Full, Ortho Full, Ortho       Neuro/Psych    Oriented x3:  Yes   Mood/Affect:  Normal       Dilation    Both eyes:  1.0% Mydriacyl, 2.5% Phenylephrine @ 8:26 AM        Slit Lamp and Fundus Exam    Slit Lamp Exam      Right Left   Lids/Lashes Meibomian gland dysfunction Meibomian gland dysfunction   Conjunctiva/Sclera Melanosis Melanosis   Cornea Arcus Clear   Anterior Chamber Deep and quiet Deep and quiet   Iris Round and dilated Round and dilated   Lens 1+ Nuclear sclerosis, 2+ Cortical cataract 1+ Nuclear sclerosis, 2+ Cortical cataract   Vitreous Vitreous syneresis, prominenet vitreous base temporally Vitreous syneresis, prominent vitreous base        Fundus Exam      Right Left   Disc Temporal Pallor, Temporal Peripapillary atrophy Temporal Pallor, 360 Peripapillary atrophy   C/D Ratio 0.6 0.6   Macula Blunted foveal reflex, No heme or edema Blunted foveal reflex, RPE mottling and clumping   Vessels Vascular attenuation Vascular attenuation   Periphery Attached, inferotemporal pigment changes, lattice degeneration from [phone] with focal area of pigmentation at 0430, small operculated hole at 1030, prominent vitreous base Attached, Operculum at 0100 / 0115, small area of pigmented lattice at 0900, prominent vitreous base          IMAGING AND PROCEDURES  Imaging and Procedures for '@TODAY'$ @  Repair Retinal Breaks, Laser - OD - Right Eye       LASER PROCEDURE NOTE  Procedure:  Barrier laser retinopexy using slit lamp laser, RIGHT eye   Diagnosis:   Retinal hole, 1030, RIGHT eye                     Lattice degeneration, [phone], RIGHT eye                         Lesions anterior to equator   Surgeon: Bernarda Caffey, MD, PhD  Anesthesia: Topical  Informed consent obtained, operative eye marked, and time out performed prior to initiation of laser.   Laser settings:  Lumenis Smart532 laser, slit  lamp Lens: Mainster PRP 165 Power: 230 mW Spot size: 200 microns Duration: 30 msec  # spots: [id number]  Placement of laser: Using a Mainster PRP 165 contact lens at the slit  lamp, laser was placed in three confluent rows around retinal hole at 1030 and inferonasal lattice degeneration from 0430-0600 anterior to equator with additional rows anteriorly.  Complications: None.  Patient tolerated the procedure well and received written and verbal post-procedure care information/education.                  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 Retinal breaks without detachment H35.89 Repair Retinal Breaks, Laser - OD - Right Eye  3. 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 Retinal edema H35.81   5. 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 Hx of syphilis Z86.19     1,2. Lattice degeneration w/ atrophic holes, OU - OD: pigmented lattice 0430-0600, small operculated hole at 1030 - OS: operculum at 0100/0115, small area of pigmented lattice at 0900 - discussed findings, prognosis, and treatment options including observation - recommend laser retinopexy OU, OD first - pt wishes to proceed with laser retinopexy OD today (08.21.19) - RBA of procedure discussed, questions answered - informed consent obtained and signed - see procedure note - start PF OD QID x 7 days - f/u 1-2 weeks, DFE, laser retinopexy OS  3. High myopia OU - under the expert refractive management of Dr. Marin Comment - OCT shows diffuse thinning of retina - discussed findings and prognosis associated with high myopia / degenerative myopia  4. No retinal edema on exam or OCT  5. Nuclear sclerosis OU-  - The symptoms of cataract, surgical options, and treatments and risks were discussed with patient. - discussed diagnosis and progression - not yet visually significant - monitor for now  6. History of Neurosyphilis - quant RPR  1:16 on 10/2017 from 1:32 in 2017 - FA on 8.16.19 - normal study without leakage, hyperfluorescence or vasculitis - no ocular involvement; no inflammation in anterior or posterior segments   Ophthalmic Meds Ordered this visit:  Meds ordered this encounter  Medications  . prednisoLONE acetate (PRED FORTE) 1 % ophthalmic suspension    Sig: Place 1 drop into the right eye 4 (four) times daily for 7 days.    Dispense:  10 mL    Refill:  0       Return in about 2 weeks (around 02/20/2018) for 1-2 wks, POV, Laser.  There are no Patient Instructions on file for this visit.   Explained the diagnoses, plan, and follow up with the patient and they expressed understanding.  Patient expressed understanding of the importance of proper follow up care.   This document serves as a record of services personally performed by Gardiner Sleeper, MD, PhD. It was created on their behalf by Ernest Mallick, OA, an ophthalmic assistant. The creation of this record is the provider's dictation and/or activities during the visit.    Electronically signed by: Ernest Mallick, OA  08.20.2019 11:51 AM   This document serves as a record of services personally performed by Gardiner Sleeper, MD, PhD. It was created on their behalf by Catha Brow, [place], a certified ophthalmic assistant. The creation of this record is the provider's dictation and/or activities during the visit.  Electronically signed by: Catha Brow, COA  08.21.19 11:51 AM    Gardiner Sleeper, M.D., Ph.D. Diseases & Surgery of the Retina and Vitreous [facility]   I have reviewed the above documentation for accuracy and completeness, and  I agree with the above. Gardiner Sleeper, M.D., Ph.D. 02/06/18 11:51 AM     Abbreviations: M myopia (nearsighted); A astigmatism; H hyperopia (farsighted); P presbyopia; Mrx spectacle prescription;  CTL contact lenses; OD right eye; OS left eye; OU both eyes  XT exotropia; ET esotropia; PEK  punctate epithelial keratitis; PEE punctate epithelial erosions; DES dry eye syndrome; MGD meibomian gland dysfunction; ATs artificial tears; PFAT's preservative free artificial tears; [place] nuclear sclerotic cataract; PSC posterior subcapsular cataract; ERM epi-retinal membrane; PVD posterior vitreous detachment; RD retinal detachment; DM diabetes mellitus; DR diabetic retinopathy; NPDR non-proliferative diabetic retinopathy; PDR proliferative diabetic retinopathy; CSME clinically significant macular edema; DME diabetic macular edema; dbh dot blot hemorrhages; CWS cotton wool spot; POAG primary open angle glaucoma; C/D cup-to-disc ratio; HVF humphrey visual field; GVF goldmann visual field; OCT optical coherence tomography; IOP intraocular pressure; BRVO Branch retinal vein occlusion; CRVO central retinal vein occlusion; CRAO central retinal artery occlusion; BRAO branch retinal artery occlusion; RT retinal tear; SB scleral buckle; PPV pars plana vitrectomy; VH Vitreous hemorrhage; PRP panretinal laser photocoagulation; IVK intravitreal kenalog; VMT vitreomacular traction; MH Macular hole;  NVD neovascularization of the disc; NVE neovascularization elsewhere; AREDS age related eye disease study; ARMD age related macular degeneration; POAG primary open angle glaucoma; EBMD epithelial/anterior basement membrane dystrophy; ACIOL anterior chamber intraocular lens; IOL intraocular lens; PCIOL posterior chamber intraocular lens; Phaco/IOL phacoemulsification with intraocular lens placement; [place] photorefractive keratectomy; LASIK laser assisted in situ keratomileusis; HTN hypertension; DM diabetes mellitus; COPD chronic obstructive pulmonary disease

## 2018-02-06 ENCOUNTER — Ambulatory Visit (INDEPENDENT_AMBULATORY_CARE_PROVIDER_SITE_OTHER): Payer: Medicaid Other | Admitting: Ophthalmology

## 2018-02-06 ENCOUNTER — Encounter (INDEPENDENT_AMBULATORY_CARE_PROVIDER_SITE_OTHER): Payer: Self-pay | Admitting: Ophthalmology

## 2018-02-06 DIAGNOSIS — H3589 Other specified retinal disorders: Secondary | ICD-10-CM

## 2018-02-06 DIAGNOSIS — H3581 Retinal edema: Secondary | ICD-10-CM

## 2018-02-06 DIAGNOSIS — Z8619 Personal history of other infectious and parasitic diseases: Secondary | ICD-10-CM

## 2018-02-06 DIAGNOSIS — H35413 Lattice degeneration of retina, bilateral: Secondary | ICD-10-CM

## 2018-02-06 DIAGNOSIS — H2513 Age-related nuclear cataract, bilateral: Secondary | ICD-10-CM

## 2018-02-06 DIAGNOSIS — H5213 Myopia, bilateral: Secondary | ICD-10-CM

## 2018-02-06 MED ORDER — PREDNISOLONE ACETATE 1 % OP SUSP: 1.0000 [drp] | Freq: Four times a day (QID) | OPHTHALMIC | 0 refills | Status: AC

## 2018-02-19 ENCOUNTER — Ambulatory Visit: Payer: Medicaid Other | Admitting: Sports Medicine

## 2018-02-19 NOTE — Progress Notes (Signed)
[facility] Note  02/20/2018     CHIEF COMPLAINT Patient presents for Retina Follow Up (F/U for lattice degeneration OU. )   HISTORY OF PRESENT ILLNESS: Eric Stephens is a 37 y.o. male who presents to the clinic today for:   HPI    Retina Follow Up    Patient presents with  Other.  In both eyes.  This started 1 month ago.  Severity is moderate.  Duration of 2 weeks.  Since onset it is stable.  I, the attending physician,  performed the HPI with the patient and updated documentation appropriately. Additional comments: F/U for lattice degeneration OU.           Comments    Patient states vision about the same OU. S/P laser for retinal break OD. Pt denies new floaters or flashes OU. No problems since laser done OD for retinal break.        Last edited by Bernarda Caffey, MD on 02/20/2018 10:39 AM. (History)       Referring physician: Kathi Ludwig, MD [place], [place] [address]  HISTORICAL INFORMATION:   Selected notes from the MEDICAL RECORD NUMBER Referred by Dr. Maryjane Hurter for retinal evaluation LEE: 07.25.19 (M. Le) [BCVA: OD: 20/60- OS: 20/60-] Ocular Hx-cataracts, HTN ret PMH-syphilis, schizophrenia, former smoker    CURRENT MEDICATIONS: No current outpatient medications on file. (Ophthalmic Drugs)   No current facility-administered medications for this visit.  (Ophthalmic Drugs)   Current Outpatient Medications (Other)  Medication Sig  . cetirizine (ZYRTEC) 10 MG tablet Take 1 tablet (10 mg total) by mouth daily. (Patient taking differently: Take 10 mg by mouth daily as needed for allergies. )  . CVS SALINE NOSE SPRAY 0.65 % nasal spray PLACE 1 SPRAY INTO BOTH NOSTRILS AS NEEDED FOR CONGESTION.  [name] gabapentin (NEURONTIN) 600 MG tablet TAKE 1 TABLET BY MOUTH TWICE DAILY FOR ANXIETY AND IRRITABILITY  . LORazepam (ATIVAN) 1 MG tablet Take 1 mg by mouth 2 (two) times daily.  . naproxen (NAPROSYN) 375 MG tablet Take 1 tablet (375 mg  total) by mouth 2 (two) times daily.  [name] PROVENTIL HFA 108 (90 Base) MCG/ACT inhaler TAKE 2 PUFFS BY MOUTH EVERY 6 HOURS AS NEEDED FOR WHEEZE OR SHORTNESS OF BREATH  . zolpidem (AMBIEN) 10 MG tablet Take 10 mg by mouth at bedtime as needed for sleep.  . benzonatate (TESSALON) 100 MG capsule Take 1 capsule (100 mg total) by mouth 3 (three) times daily as needed for cough. (Patient not taking: Reported on 02/06/2018)  . cyclobenzaprine (FLEXERIL) 10 MG tablet Take 1 tablet (10 mg total) by mouth 2 (two) times daily as needed for muscle spasms. (Patient not taking: Reported on 02/06/2018)   No current facility-administered medications for this visit.  (Other)      REVIEW OF SYSTEMS: ROS    Positive for: Eyes   Negative for: Constitutional, Gastrointestinal, Neurological, Skin, Genitourinary, Musculoskeletal, HENT, Endocrine, Cardiovascular, Respiratory, Psychiatric, Allergic/Imm, Heme/Lymph   Last edited by Roselee Nova D on 02/20/2018  9:49 AM. (History)       ALLERGIES No Known Allergies  PAST MEDICAL HISTORY Past Medical History:  Diagnosis Date  . Syphilis    Past Surgical History:  Procedure Laterality Date  . WRIST SURGERY      FAMILY HISTORY Family History  Problem Relation Age of Onset  . Hypertension Father   . Hypertension Mother   . Arthritis Mother     SOCIAL HISTORY  Social History   Tobacco Use  . Smoking status: Current Some Day Smoker    Packs/day: 0.50    Types: Cigarettes  . Smokeless tobacco: Never Used  Substance Use Topics  . Alcohol use: No  . Drug use: No         OPHTHALMIC EXAM:  Base Eye Exam    Visual Acuity (Snellen - Linear)      Right Left   Dist cc 20/40 -1 20/50 -2   Dist ph cc NI 20/40 -2   Correction:  Glasses       Tonometry (Tonopen, 10:05 AM)      Right Left   Pressure 19 25       Tonometry #2 (Tonopen, 10:06 AM)      Right Left   Pressure 20 27       Pupils      Dark Light Shape React APD   Right 4 3 Round  Brisk None   Left 4 3 Round Brisk None       Visual Fields (Counting fingers)      Left Right    Full Full       Extraocular Movement      Right Left    Full, Ortho Full, Ortho       Neuro/Psych    Oriented x3:  Yes   Mood/Affect:  Normal       Dilation    Both eyes:  1.0% Mydriacyl, 2.5% Phenylephrine @ 10:06 AM        Slit Lamp and Fundus Exam    Slit Lamp Exam      Right Left   Lids/Lashes Meibomian gland dysfunction Meibomian gland dysfunction   Conjunctiva/Sclera Melanosis Melanosis   Cornea Arcus Clear   Anterior Chamber Deep and quiet Deep and quiet   Iris Round and dilated Round and dilated   Lens 1+ Nuclear sclerosis, 2+ Cortical cataract 1+ Nuclear sclerosis, 2+ Cortical cataract   Vitreous Vitreous syneresis, prominenet vitreous base temporally Vitreous syneresis, prominent vitreous base        Fundus Exam      Right Left   Disc Temporal Pallor, Temporal Peripapillary atrophy Temporal Pallor, 360 Peripapillary atrophy   C/D Ratio 0.6 0.6   Macula Blunted foveal reflex, No heme or edema Blunted foveal reflex, RPE mottling and clumping   Vessels Vascular attenuation Vascular attenuation   Periphery Attached, inferotemporal pigment changes, lattice degeneration from [phone] with focal area of pigmentation at 0430, small operculated hole at 1030 -- good early laser changes, prominent vitreous base Attached, Operculum at 0100 and 0230, small area of pigmented lattice at 0500, prominent vitreous base        Refraction    Wearing Rx      Sphere Cylinder Axis Add   Right -12.75 +1.00 157 +2.50   Left -13.50 +1.75 040 +3.25   Age:  2 mo   Type:  Bifocal          IMAGING AND PROCEDURES  Imaging and Procedures for _0 @  Repair Retinal Breaks, Laser - OS - Left Eye       LASER PROCEDURE NOTE  Procedure:  Barrier laser retinopexy using slit lamp laser, LEFT eye   Diagnosis:   Retinal holes, LEFT eye                         Lattice Degeneration,  LEFT eye -- 0500 ant to equator  Small opercula at 1 and 230 o'clock anterior to equator   Surgeon: Bernarda Caffey, MD, PhD  Anesthesia: Topical  Informed consent obtained, operative eye marked, and time out performed prior to initiation of laser.   Laser settings:  Lumenis Smart532 laser, slit lamp Lens: Mainster PRP 165 Power: 280 mW Spot size: 200 microns Duration: 30 msec  # spots: 397  Placement of laser: Using a Mainster PRP 165 contact lens at the slit lamp, laser was placed in three confluent rows around opercula at 1 and 230 oclock anterior to equator with additional rows anteriorly and 0500 pigmented lattice.  Complications: None.  Patient tolerated the procedure well and received written and verbal post-procedure care information/education.                  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 Retinal breaks without detachment H35.89 Repair Retinal Breaks, Laser - OS - Left Eye  3. 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 Retinal edema H35.81   5. 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 Hx of syphilis Z86.19     1,2. Lattice degeneration w/ atrophic holes, OU - OD: pigmented lattice 0430-0600, small operculated hole at 1030 - OS: operculum at 0100/0115, small area of pigmented lattice at 0900 - discussed findings, prognosis, and treatment options including observation - recommend laser retinopexy OU, OD first - pt wishes to proceed with laser retinopexy OS today (09.04.19) - S/P laser retinopexy OD (08.21.19) - RBA of procedure discussed, questions answered - informed consent obtained and signed - see procedure note - start PF OS QID x 7 days - f/u 3 weeks, DFE  3. High myopia OU - under the expert refractive management of Dr. Marin Comment - OCT shows diffuse thinning of retina - discussed findings and prognosis associated with high myopia /  degenerative myopia  4. No retinal edema on exam or OCT  5. Nuclear sclerosis OU-  - The symptoms of cataract, surgical options, and treatments and risks were discussed with patient. - discussed diagnosis and progression - not yet visually significant - monitor for now  6. History of Neurosyphilis - quant RPR 1:16 on 10/2017 from 1:32 in 2017 - FA on 8.16.19 - normal study without leakage, hyperfluorescence or vasculitis - no ocular involvement; no inflammation in anterior or posterior segments   Ophthalmic Meds Ordered this visit:  No orders of the defined types were placed in this encounter.      Return in about 3 weeks (around 03/13/2018) for F/U laser ret OU, DFE.  There are no Patient Instructions on file for this visit.   Explained the diagnoses, plan, and follow up with the patient and they expressed understanding.  Patient expressed understanding of the importance of proper follow up care.   This document serves as a record of services personally performed by Gardiner Sleeper, MD, PhD. It was created on their behalf by Ernest Mallick, OA, an ophthalmic assistant. The creation of this record is the provider's dictation and/or activities during the visit.    Electronically signed by: Ernest Mallick, OA  09.03.2019 11:33 AM   Gardiner Sleeper, M.D., Ph.D. Diseases & Surgery of the Retina and Vitreous [facility]  I have reviewed the above documentation for accuracy and completeness, and I agree with the above. Gardiner Sleeper, M.D., Ph.D. 02/20/18 11:33 AM  Abbreviations: M myopia (nearsighted); A astigmatism; H hyperopia (farsighted); P presbyopia; Mrx spectacle prescription;  CTL contact lenses; OD right eye; OS left eye; OU both eyes  XT exotropia; ET esotropia; PEK punctate epithelial keratitis; PEE punctate epithelial erosions; DES dry eye syndrome; MGD meibomian gland dysfunction; ATs artificial tears; PFAT's preservative free artificial tears; [place]  nuclear sclerotic cataract; PSC posterior subcapsular cataract; ERM epi-retinal membrane; PVD posterior vitreous detachment; RD retinal detachment; DM diabetes mellitus; DR diabetic retinopathy; NPDR non-proliferative diabetic retinopathy; PDR proliferative diabetic retinopathy; CSME clinically significant macular edema; DME diabetic macular edema; dbh dot blot hemorrhages; CWS cotton wool spot; POAG primary open angle glaucoma; C/D cup-to-disc ratio; HVF humphrey visual field; GVF goldmann visual field; OCT optical coherence tomography; IOP intraocular pressure; BRVO Branch retinal vein occlusion; CRVO central retinal vein occlusion; CRAO central retinal artery occlusion; BRAO branch retinal artery occlusion; RT retinal tear; SB scleral buckle; PPV pars plana vitrectomy; VH Vitreous hemorrhage; PRP panretinal laser photocoagulation; IVK intravitreal kenalog; VMT vitreomacular traction; MH Macular hole;  NVD neovascularization of the disc; NVE neovascularization elsewhere; AREDS age related eye disease study; ARMD age related macular degeneration; POAG primary open angle glaucoma; EBMD epithelial/anterior basement membrane dystrophy; ACIOL anterior chamber intraocular lens; IOL intraocular lens; PCIOL posterior chamber intraocular lens; Phaco/IOL phacoemulsification with intraocular lens placement; [place] photorefractive keratectomy; LASIK laser assisted in situ keratomileusis; HTN hypertension; DM diabetes mellitus; COPD chronic obstructive pulmonary disease

## 2018-02-20 ENCOUNTER — Ambulatory Visit (INDEPENDENT_AMBULATORY_CARE_PROVIDER_SITE_OTHER): Payer: Medicaid Other | Admitting: Ophthalmology

## 2018-02-20 ENCOUNTER — Encounter (INDEPENDENT_AMBULATORY_CARE_PROVIDER_SITE_OTHER): Payer: Self-pay | Admitting: Ophthalmology

## 2018-02-20 DIAGNOSIS — H3589 Other specified retinal disorders: Secondary | ICD-10-CM | POA: Diagnosis not present

## 2018-02-20 DIAGNOSIS — H35413 Lattice degeneration of retina, bilateral: Secondary | ICD-10-CM

## 2018-02-20 DIAGNOSIS — H3581 Retinal edema: Secondary | ICD-10-CM

## 2018-02-20 DIAGNOSIS — H5213 Myopia, bilateral: Secondary | ICD-10-CM

## 2018-02-20 DIAGNOSIS — H2513 Age-related nuclear cataract, bilateral: Secondary | ICD-10-CM

## 2018-02-20 DIAGNOSIS — Z8619 Personal history of other infectious and parasitic diseases: Secondary | ICD-10-CM

## 2018-02-25 ENCOUNTER — Telehealth (INDEPENDENT_AMBULATORY_CARE_PROVIDER_SITE_OTHER): Payer: Self-pay

## 2018-02-25 NOTE — Telephone Encounter (Signed)
Per med tech document taken by [employer]. And Dietrich Pates. - Pt called stating that he was out of medication and would be unable to treat OS for the remaining 5 days; Per Dr. Ashley Royalty ERx was sent in for Ketorolac OS QID x 7 days;   Virgilio Belling, COA

## 2018-03-14 NOTE — Progress Notes (Signed)
[facility] Note  03/15/2018     CHIEF COMPLAINT Patient presents for Retina Follow Up   HISTORY OF PRESENT ILLNESS: Eric Stephens is a 37 y.o. male who presents to the clinic today for:   HPI    Retina Follow Up    Patient presents with  Other.  In both eyes.  This started 1.  Severity is mild.  Duration of 1 month.  Since onset it is stable.  I, the attending physician,  performed the HPI with the patient and updated documentation appropriately.          Comments    F/U lattice degen OU. S/P Laser retinopexy OD(02/06/18 and OS (02/20/18)Patient states his vision"is good",denies new visual onsets. Pt completed PF as instructed.        Last edited by Bernarda Caffey, MD on 03/18/2018  8:31 AM. (History)       Referring physician: Kathi Ludwig, MD [place], [place] [address]  HISTORICAL INFORMATION:   Selected notes from the MEDICAL RECORD NUMBER Referred by Dr. Maryjane Hurter for retinal evaluation LEE: 07.25.19 (M. Le) [BCVA: OD: 20/60- OS: 20/60-] Ocular Hx-cataracts, HTN ret PMH-syphilis, schizophrenia, former smoker    CURRENT MEDICATIONS: No current outpatient medications on file. (Ophthalmic Drugs)   No current facility-administered medications for this visit.  (Ophthalmic Drugs)   Current Outpatient Medications (Other)  Medication Sig  . benzonatate (TESSALON) 100 MG capsule Take 1 capsule (100 mg total) by mouth 3 (three) times daily as needed for cough.  . cetirizine (ZYRTEC) 10 MG tablet Take 1 tablet (10 mg total) by mouth daily. (Patient taking differently: Take 10 mg by mouth daily as needed for allergies. )  . [employer] SALINE NOSE SPRAY 0.65 % nasal spray PLACE 1 SPRAY INTO BOTH NOSTRILS AS NEEDED FOR CONGESTION.  . cyclobenzaprine (FLEXERIL) 10 MG tablet Take 1 tablet (10 mg total) by mouth 2 (two) times daily as needed for muscle spasms.  [name] gabapentin (NEURONTIN) 600 MG tablet TAKE 1 TABLET BY MOUTH TWICE DAILY FOR ANXIETY AND  IRRITABILITY  . LORazepam (ATIVAN) 1 MG tablet Take 1 mg by mouth 2 (two) times daily.  . naproxen (NAPROSYN) 375 MG tablet Take 1 tablet (375 mg total) by mouth 2 (two) times daily.  [name] PROVENTIL HFA 108 (90 Base) MCG/ACT inhaler TAKE 2 PUFFS BY MOUTH EVERY 6 HOURS AS NEEDED FOR WHEEZE OR SHORTNESS OF BREATH  . zolpidem (AMBIEN) 10 MG tablet Take 10 mg by mouth at bedtime as needed for sleep.   No current facility-administered medications for this visit.  (Other)      REVIEW OF SYSTEMS: ROS    Positive for: Eyes   Negative for: Constitutional, Gastrointestinal, Neurological, Skin, Genitourinary, Musculoskeletal, HENT, Endocrine, Cardiovascular, Respiratory, Psychiatric, Allergic/Imm, Heme/Lymph   Last edited by Zenovia Jordan, LPN on 5/85/2778  2:42 AM. (History)       ALLERGIES No Known Allergies  PAST MEDICAL HISTORY Past Medical History:  Diagnosis Date  . Syphilis    Past Surgical History:  Procedure Laterality Date  . WRIST SURGERY      FAMILY HISTORY Family History  Problem Relation Age of Onset  . Hypertension Father   . Hypertension Mother   . Arthritis Mother     SOCIAL HISTORY Social History   Tobacco Use  . Smoking status: Current Some Day Smoker    Packs/day: 0.50    Types: Cigarettes  . Smokeless tobacco: Never Used  Substance Use  Topics  . Alcohol use: No  . Drug use: No         OPHTHALMIC EXAM:  Base Eye Exam    Visual Acuity (Snellen - Linear)      Right Left   Dist [place] 20/50 +2 20/50 -1   Dist ph  NI 20/50   Correction:  Glasses       Tonometry (Tonopen, 9:37 AM)      Right Left   Pressure 16 17       Pupils      Dark Light Shape React APD   Right 4 3 Round Brisk None   Left 4 3 Round Brisk None       Visual Fields (Counting fingers)      Left Right    Full Full       Extraocular Movement      Right Left    Full, Ortho Full, Ortho       Neuro/Psych    Oriented x3:  Yes   Mood/Affect:  Normal        Dilation    Both eyes:  1.0% Mydriacyl, 2.5% Phenylephrine @ 9:37 AM        Slit Lamp and Fundus Exam    Slit Lamp Exam      Right Left   Lids/Lashes Meibomian gland dysfunction Meibomian gland dysfunction   Conjunctiva/Sclera Melanosis Melanosis   Cornea Arcus Clear   Anterior Chamber Deep and quiet Deep and quiet   Iris Round and dilated Round and dilated   Lens 1+ Nuclear sclerosis, 2+ Cortical cataract 1+ Nuclear sclerosis, 2+ Cortical cataract   Vitreous Vitreous syneresis, prominenet vitreous base temporally Vitreous syneresis, prominent vitreous base        Fundus Exam      Right Left   Disc Temporal Pallor, Temporal Peripapillary atrophy Temporal Pallor, 360 Peripapillary atrophy   C/D Ratio 0.6 0.6   Macula Blunted foveal reflex, No heme or edema Blunted foveal reflex, RPE mottling and clumping   Vessels Vascular attenuation Vascular attenuation   Periphery Attached, inferotemporal pigment changes, lattice degeneration from [phone] with focal area of pigmentation at 0430, small operculated hole at 1030 -- good laser changes, prominent vitreous base Attached, Operculum at 0100 and 0230, small area of pigmented lattice at 0500 -- early laser changes, but incomplete, prominent vitreous base          IMAGING AND PROCEDURES  Imaging and Procedures for _0 @           ASSESSMENT/PLAN:    ICD-10-CM   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 Retinal edema H35.81   5. 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 Hx of syphilis Z86.19     1,2. Lattice degeneration w/ atrophic holes, OU - OD: pigmented lattice [phone], small operculated hole at 1030 - OS: operculum at 0100/0115, small area of pigmented lattice at 0900 - S/P laser retinopexy OD (08.21.19) -- good laser surrounding - S/P laser retinopexy OS (09.04.19) -- early laser changes, but incomplete - finished  PF OS QID x 7 days - recommend touch up laser OS -- pt wishes to return for laser - f/u 2 weeks for touch up laser  3. High myopia OU - under the expert refractive management of Dr. Marin Comment - OCT shows diffuse thinning of retina - discussed findings and prognosis associated with high myopia / degenerative  myopia  4. No retinal edema on exam or OCT  5. Nuclear sclerosis OU-  - The symptoms of cataract, surgical options, and treatments and risks were discussed with patient. - discussed diagnosis and progression - not yet visually significant - monitor for now  6. History of Neurosyphilis - quant RPR 1:16 on 10/2017 from 1:32 in 2017 - FA on 8.16.19 - normal study without leakage, hyperfluorescence or vasculitis - no ocular involvement; no inflammation in anterior or posterior segments    Ophthalmic Meds Ordered this visit:  No orders of the defined types were placed in this encounter.      Return in about 2 weeks (around 03/29/2018) for F/U Lattice OU, DFE.  There are no Patient Instructions on file for this visit.   Explained the diagnoses, plan, and follow up with the patient and they expressed understanding.  Patient expressed understanding of the importance of proper follow up care.   This document serves as a record of services personally performed by Gardiner Sleeper, MD, PhD. It was created on their behalf by Catha Brow, [place], a certified ophthalmic assistant. The creation of this record is the provider's dictation and/or activities during the visit.  Electronically signed by: Catha Brow, COA  09.26.19 8:31 AM   Gardiner Sleeper, M.D., Ph.D. Diseases & Surgery of the Retina and Vitreous [facility]  I have reviewed the above documentation for accuracy and completeness, and I agree with the above. Gardiner Sleeper, M.D., Ph.D. 03/18/18 8:32 AM    Abbreviations: M myopia (nearsighted); A astigmatism; H hyperopia (farsighted); P presbyopia;  Mrx spectacle prescription;  CTL contact lenses; OD right eye; OS left eye; OU both eyes  XT exotropia; ET esotropia; PEK punctate epithelial keratitis; PEE punctate epithelial erosions; DES dry eye syndrome; MGD meibomian gland dysfunction; ATs artificial tears; PFAT's preservative free artificial tears; [place] nuclear sclerotic cataract; PSC posterior subcapsular cataract; ERM epi-retinal membrane; PVD posterior vitreous detachment; RD retinal detachment; DM diabetes mellitus; DR diabetic retinopathy; NPDR non-proliferative diabetic retinopathy; PDR proliferative diabetic retinopathy; CSME clinically significant macular edema; DME diabetic macular edema; dbh dot blot hemorrhages; CWS cotton wool spot; POAG primary open angle glaucoma; C/D cup-to-disc ratio; HVF humphrey visual field; GVF goldmann visual field; OCT optical coherence tomography; IOP intraocular pressure; BRVO Branch retinal vein occlusion; CRVO central retinal vein occlusion; CRAO central retinal artery occlusion; BRAO branch retinal artery occlusion; RT retinal tear; SB scleral buckle; PPV pars plana vitrectomy; VH Vitreous hemorrhage; PRP panretinal laser photocoagulation; IVK intravitreal kenalog; VMT vitreomacular traction; MH Macular hole;  NVD neovascularization of the disc; NVE neovascularization elsewhere; AREDS age related eye disease study; ARMD age related macular degeneration; POAG primary open angle glaucoma; EBMD epithelial/anterior basement membrane dystrophy; ACIOL anterior chamber intraocular lens; IOL intraocular lens; PCIOL posterior chamber intraocular lens; Phaco/IOL phacoemulsification with intraocular lens placement; [place] photorefractive keratectomy; LASIK laser assisted in situ keratomileusis; HTN hypertension; DM diabetes mellitus; COPD chronic obstructive pulmonary disease

## 2018-03-15 ENCOUNTER — Ambulatory Visit (INDEPENDENT_AMBULATORY_CARE_PROVIDER_SITE_OTHER): Payer: Medicaid Other | Admitting: Ophthalmology

## 2018-03-15 ENCOUNTER — Encounter (INDEPENDENT_AMBULATORY_CARE_PROVIDER_SITE_OTHER): Payer: Self-pay | Admitting: Ophthalmology

## 2018-03-15 DIAGNOSIS — Z8619 Personal history of other infectious and parasitic diseases: Secondary | ICD-10-CM

## 2018-03-15 DIAGNOSIS — H35413 Lattice degeneration of retina, bilateral: Secondary | ICD-10-CM

## 2018-03-15 DIAGNOSIS — H2513 Age-related nuclear cataract, bilateral: Secondary | ICD-10-CM

## 2018-03-15 DIAGNOSIS — H3581 Retinal edema: Secondary | ICD-10-CM

## 2018-03-15 DIAGNOSIS — H5213 Myopia, bilateral: Secondary | ICD-10-CM

## 2018-03-15 DIAGNOSIS — H3589 Other specified retinal disorders: Secondary | ICD-10-CM

## 2018-03-18 ENCOUNTER — Encounter (INDEPENDENT_AMBULATORY_CARE_PROVIDER_SITE_OTHER): Payer: Self-pay | Admitting: Ophthalmology

## 2018-03-27 NOTE — Progress Notes (Signed)
Pt here today for laser retinopexy OS. Pt left prior to being seen by MD due to leg pain/spasms. Pt has rescheduled procedure appt.  Karie Chimera, M.D., Ph.D. Diseases & Surgery of the Retina and Vitreous [facility] 04/01/18

## 2018-03-28 ENCOUNTER — Ambulatory Visit (HOSPITAL_COMMUNITY)
Admission: EM | Admit: 2018-03-28 | Discharge: 2018-03-28 | Disposition: A | Discharge status: Home / Self Care | Payer: Medicaid Other | Attending: Family Medicine | Admitting: Family Medicine

## 2018-03-28 ENCOUNTER — Encounter (HOSPITAL_COMMUNITY): Payer: Self-pay | Admitting: Emergency Medicine

## 2018-03-28 DIAGNOSIS — M79605 Pain in left leg: Secondary | ICD-10-CM | POA: Diagnosis not present

## 2018-03-28 DIAGNOSIS — T148XXA Other injury of unspecified body region, initial encounter: Secondary | ICD-10-CM

## 2018-03-28 DIAGNOSIS — S86912A Strain of unspecified muscle(s) and tendon(s) at lower leg level, left leg, initial encounter: Secondary | ICD-10-CM

## 2018-03-28 MED ORDER — PREDNISONE 20 MG PO TABS: ORAL_TABLET | ORAL | 0 refills | Status: DC

## 2018-03-28 NOTE — ED Triage Notes (Signed)
PT C/O: constant left leg pain onset 1 week and pain increases w/activity... Pain radiates from left hip all the way down to his right foot.   DENIES: inj/trauma  TAKING MEDS: OTC acetaminophen and advil.   A&O x4... NAD... Ambulatory

## 2018-03-28 NOTE — ED Provider Notes (Signed)
[facility]    CSN: [id number] Arrival date & time: 03/28/18  1048     History   Chief Complaint Chief Complaint  Patient presents with  . Leg Pain    HPI Eric Stephens is a 37 y.o. male.   PT C/O: constant left leg pain onset 2 days ago and pain increases w/activity... Pain radiates from left hip all the way down to his left foot.  She walks quite a bit every day in the last couple days he had to walk an extra amount because of the bus situation.  He carries a backpack most of the time.  DENIES: inj/trauma, weakness in the leg, fever, urinary instability  TAKING MEDS: OTC acetaminophen and advil.   A&O x4... NAD... Ambulatory       Past Medical History:  Diagnosis Date  . Syphilis     Patient Active Problem List   Diagnosis Date Noted  . Syphilis 11/20/2017  . Onychomycosis of toenail 11/01/2017  . Upper respiratory tract infection 07/03/2017  . Asthma 06/08/2017  . Rib pain on right side 03/09/2017  . Bilateral lower extremity edema 02/06/2017  . Obesity (BMI 30-39.9) 01/11/2017  . Anxiety 01/11/2017  . Insomnia 01/11/2017  . Neurosyphilis 01/08/2017  . 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 Catatonia 07/04/2016  . Schizophrenia (HCC) 02/04/2016    Past Surgical History:  Procedure Laterality Date  . WRIST SURGERY         Home Medications    Prior to Admission medications   Medication Sig Start Date End Date Taking? Authorizing Provider  gabapentin (NEURONTIN) 600 MG tablet TAKE 1 TABLET BY MOUTH TWICE DAILY FOR ANXIETY AND IRRITABILITY 11/10/17  Yes [provider]  LORazepam (ATIVAN) 1 MG tablet Take 1 mg by mouth 2 (two) times daily.   Yes [provider]  naproxen (NAPROSYN) 375 MG tablet Take 1 tablet (375 mg total) by mouth 2 (two) times daily. 10/23/17  Yes Wurst, [place], PA-C  PROVENTIL HFA 108 (90 Base) MCG/ACT inhaler TAKE 2 PUFFS BY MOUTH EVERY 6 HOURS AS NEEDED  FOR WHEEZE OR SHORTNESS OF BREATH 09/15/17  Yes Lanelle Bal, MD  zolpidem (AMBIEN) 10 MG tablet Take 10 mg by mouth at bedtime as needed for sleep.   Yes [provider]  benzonatate (TESSALON) 100 MG capsule Take 1 capsule (100 mg total) by mouth 3 (three) times daily as needed for cough. 07/03/17   Camelia Phenes, DO  cetirizine (ZYRTEC) 10 MG tablet Take 1 tablet (10 mg total) by mouth daily. Patient taking differently: Take 10 mg by mouth daily as needed for allergies.  06/08/17   Molt, Beth[place], DO  CVS SALINE NOSE SPRAY 0.65 % nasal spray PLACE 1 SPRAY INTO BOTH NOSTRILS AS NEEDED FOR CONGESTION. 07/30/17   Lanelle Bal, MD  cyclobenzaprine (FLEXERIL) 10 MG tablet Take 1 tablet (10 mg total) by mouth 2 (two) times daily as needed for muscle spasms. 10/23/17   Wurst, [place], PA-C  predniSONE (DELTASONE) 20 MG tablet Two daily with food 03/28/18   Elvina Sidle, MD    Family History Family History  Problem Relation Age of Onset  . Hypertension Father   . Hypertension Mother   . Arthritis Mother     Social History Social History   Tobacco Use  . Smoking status: Current Some Day Smoker    Packs/day: 0.50    Types: Cigarettes  . Smokeless tobacco: Never Used  Substance Use Topics  .  Alcohol use: No  . Drug use: No     Allergies   Patient has no known allergies.   Review of Systems Review of Systems  Constitutional: Negative.   Musculoskeletal: Positive for myalgias.  Neurological: Negative for weakness and numbness.     Physical Exam Triage Vital Signs ED Triage Vitals  Enc Vitals Group     BP 03/28/18 1148 116/82     Pulse Rate 03/28/18 1148 64     Resp 03/28/18 1148 20     Temp 03/28/18 1148 98.4 F (36.9 C)     Temp Source 03/28/18 1148 Oral     SpO2 03/28/18 1148 98 %     Weight --      Height --      Head Circumference --      Peak Flow --      Pain Score 03/28/18 1150 10     Pain Loc --      Pain Edu? --      Excl.  in GC? --    No data found.  Updated Vital Signs BP 116/82 (BP Location: Right Arm)   Pulse 64   Temp 98.4 F (36.9 C) (Oral)   Resp 20   SpO2 98%    Physical Exam  Constitutional: He is oriented to person, place, and time. He appears well-developed and well-nourished.  HENT:  Right Ear: External ear normal.  Left Ear: External ear normal.  Mouth/Throat: Oropharynx is clear and moist.  Eyes: Conjunctivae are normal.  Neck: Normal range of motion. Neck supple.  Pulmonary/Chest: Effort normal.  Musculoskeletal: Normal range of motion.  Negative straight leg raising No localized muscle pain, swelling, redness in the left leg  Neurological: He is alert and oriented to person, place, and time.  Skin: Skin is warm and dry.  Nursing note and vitals reviewed.    UC Treatments / Results  Labs (all labs ordered are listed, but only abnormal results are displayed) Labs Reviewed - No data to display  EKG None  Radiology No results found.  Procedures Procedures (including critical care time)  Medications Ordered in UC Medications - No data to display  Initial Impression / Assessment and Plan / UC Course  I have reviewed the triage vital signs and the nursing notes.  Pertinent labs & imaging results that were available during my care of the patient were reviewed by me and considered in my medical decision making (see chart for details).    Final Clinical Impressions(s) / UC Diagnoses   Final diagnoses:  Left leg pain  Muscle strain   Discharge Instructions   None    ED Prescriptions    Medication Sig Dispense Auth. Provider   predniSONE (DELTASONE) 20 MG tablet Two daily with food 10 tablet Elvina Sidle, MD     Controlled Substance Prescriptions  Controlled Substance Registry consulted? Not Applicable   Elvina Sidle, MD 03/28/18 1222

## 2018-04-01 ENCOUNTER — Encounter (INDEPENDENT_AMBULATORY_CARE_PROVIDER_SITE_OTHER): Payer: Self-pay | Admitting: Ophthalmology

## 2018-04-01 ENCOUNTER — Ambulatory Visit (INDEPENDENT_AMBULATORY_CARE_PROVIDER_SITE_OTHER): Payer: Medicaid Other | Admitting: Ophthalmology

## 2018-04-01 DIAGNOSIS — H35413 Lattice degeneration of retina, bilateral: Secondary | ICD-10-CM

## 2018-04-01 DIAGNOSIS — H3581 Retinal edema: Secondary | ICD-10-CM

## 2018-04-01 DIAGNOSIS — H2513 Age-related nuclear cataract, bilateral: Secondary | ICD-10-CM

## 2018-04-01 DIAGNOSIS — H3589 Other specified retinal disorders: Secondary | ICD-10-CM

## 2018-04-01 DIAGNOSIS — Z8619 Personal history of other infectious and parasitic diseases: Secondary | ICD-10-CM

## 2018-04-01 DIAGNOSIS — H5213 Myopia, bilateral: Secondary | ICD-10-CM

## 2018-04-03 ENCOUNTER — Encounter (HOSPITAL_COMMUNITY): Payer: Self-pay

## 2018-04-03 ENCOUNTER — Ambulatory Visit (HOSPITAL_COMMUNITY)
Admission: EM | Admit: 2018-04-03 | Discharge: 2018-04-03 | Disposition: A | Discharge status: Home / Self Care | Payer: Medicaid Other | Attending: Family Medicine | Admitting: Family Medicine

## 2018-04-03 DIAGNOSIS — M25562 Pain in left knee: Secondary | ICD-10-CM

## 2018-04-03 DIAGNOSIS — M62838 Other muscle spasm: Secondary | ICD-10-CM

## 2018-04-03 HISTORY — DX: Unspecified asthma, uncomplicated: J45.909

## 2018-04-03 MED ORDER — CYCLOBENZAPRINE HCL 10 MG PO TABS: 10.0000 mg | ORAL_TABLET | Freq: Two times a day (BID) | ORAL | 0 refills | Status: DC | PRN

## 2018-04-03 MED ORDER — MELOXICAM 7.5 MG PO TABS: 7.5000 mg | ORAL_TABLET | Freq: Every day | ORAL | 1 refills | Status: DC

## 2018-04-03 NOTE — Discharge Instructions (Addendum)
We will go ahead and treat you your pain with meloxicam daily. We will treat the muscle spasms with Flexeril.  Be aware this may make you drowsy. Follow up as needed for continued or worsening symptoms

## 2018-04-03 NOTE — ED Triage Notes (Signed)
Pt presents with pain starting in his left rib area going all the way down to his left ankle.

## 2018-04-03 NOTE — ED Provider Notes (Signed)
[facility]    CSN: [id number] Arrival date & time: 04/03/18  1100     History   Chief Complaint Chief Complaint  Patient presents with  . Left side and Leg Pain    HPI Eric Stephens is a 37 y.o. male.   Patient is a 37 year old male who presents with continued left lower back, hip pain radiating down his left leg and spasming in his left leg. He was seen here on the 10th and given 5 days of prednisone. Reports minimal relief from the medication. His symptoms continue. He has some numbness and tingling at times. No injury, loss or bowel or bladder function. It is hard for him to sleep because of the pain. No fever,chils, body aches, fatigue.   ROS per HPI      Past Medical History:  Diagnosis Date  . Asthma   . Syphilis     Patient Active Problem List   Diagnosis Date Noted  . Syphilis 11/20/2017  . Onychomycosis of toenail 11/01/2017  . Upper respiratory tract infection 07/03/2017  . Asthma 06/08/2017  . Rib pain on right side 03/09/2017  . Bilateral lower extremity edema 02/06/2017  . Obesity (BMI 30-39.9) 01/11/2017  . Anxiety 01/11/2017  . Insomnia 01/11/2017  . Neurosyphilis 01/08/2017  . 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 Catatonia 07/04/2016  . Schizophrenia (HCC) 02/04/2016    Past Surgical History:  Procedure Laterality Date  . WRIST SURGERY         Home Medications    Prior to Admission medications   Medication Sig Start Date End Date Taking? Authorizing Provider  benzonatate (TESSALON) 100 MG capsule Take 1 capsule (100 mg total) by mouth 3 (three) times daily as needed for cough. 07/03/17   Camelia Phenes, DO  cetirizine (ZYRTEC) 10 MG tablet Take 1 tablet (10 mg total) by mouth daily. Patient taking differently: Take 10 mg by mouth daily as needed for allergies.  06/08/17   Molt, [place], DO  [employer]S SALINE NOSE SPRAY 0.65 % nasal spray PLACE 1 SPRAY INTO BOTH NOSTRILS AS  NEEDED FOR CONGESTION. 07/30/17   Lanelle Bal, MD  cyclobenzaprine (FLEXERIL) 10 MG tablet Take 1 tablet (10 mg total) by mouth 2 (two) times daily as needed for muscle spasms. 04/03/18   Cece Milhouse, Gloris [place] A, NP  gabapentin (NEURONTIN) 600 MG tablet TAKE 1 TABLET BY MOUTH TWICE DAILY FOR ANXIETY AND IRRITABILITY 11/10/17   [provider]  LORazepam (ATIVAN) 1 MG tablet Take 1 mg by mouth 2 (two) times daily.    [provider]  meloxicam (MOBIC) 7.5 MG tablet Take 1 tablet (7.5 mg total) by mouth daily. 04/03/18   Dahlia Byes A, NP  naproxen (NAPROSYN) 375 MG tablet Take 1 tablet (375 mg total) by mouth 2 (two) times daily. 10/23/17   Wurst, [place], PA-C  predniSONE (DELTASONE) 20 MG tablet Two daily with food 03/28/18   Elvina Sidle, MD  PROVENTIL HFA 108 [phone] Base) MCG/ACT inhaler TAKE 2 PUFFS BY MOUTH EVERY 6 HOURS AS NEEDED FOR WHEEZE OR SHORTNESS OF BREATH 09/15/17   Lanelle Bal, MD  zolpidem (AMBIEN) 10 MG tablet Take 10 mg by mouth at bedtime as needed for sleep.    [provider]    Family History Family History  Problem Relation Age of Onset  . Hypertension Father   . Hypertension Mother   . Arthritis Mother     Social History Social History   Tobacco  Use  . Smoking status: Current Some Day Smoker    Packs/day: 0.50    Types: Cigarettes  . Smokeless tobacco: Never Used  Substance Use Topics  . Alcohol use: No  . Drug use: No     Allergies   Patient has no known allergies.   Review of Systems Review of Systems   Physical Exam Triage Vital Signs ED Triage Vitals  Enc Vitals Group     BP 04/03/18 1156 115/67     Pulse Rate 04/03/18 1156 63     Resp 04/03/18 1156 20     Temp 04/03/18 1156 97.9 F (36.6 C)     Temp Source 04/03/18 1156 Oral     SpO2 04/03/18 1156 100 %     Weight --      Height --      Head Circumference --      Peak Flow --      Pain Score 04/03/18 1157 7     Pain Loc --      Pain Edu? --       Excl. in GC? --    No data found.  Updated Vital Signs BP 115/67 (BP Location: Left Arm)   Pulse 63   Temp 97.9 F (36.6 C) (Oral)   Resp 20   SpO2 100%   Visual Acuity Right Eye Distance:   Left Eye Distance:   Bilateral Distance:    Right Eye Near:   Left Eye Near:    Bilateral Near:     Physical Exam  Constitutional: He is oriented to person, place, and time. He appears well-developed and well-nourished.  Very pleasant. Non toxic or ill appearing.   HENT:  Head: Normocephalic and atraumatic.  Eyes: Conjunctivae are normal.  Neck: Normal range of motion.  Pulmonary/Chest: Effort normal.  Musculoskeletal: Normal range of motion. He exhibits no edema, tenderness or deformity.  Unable to produce any tenderness in the lumbar spine or musculature. No swelling, bruising, erythema. Negative straight leg. Pt rubbing the left leg constantly during exam.   Neurological: He is alert and oriented to person, place, and time.  Skin: Skin is warm and dry.  Psychiatric: He has a normal mood and affect.  Nursing note and vitals reviewed.    UC Treatments / Results  Labs (all labs ordered are listed, but only abnormal results are displayed) Labs Reviewed - No data to display  EKG None  Radiology No results found.  Procedures Procedures (including critical care time)  Medications Ordered in UC Medications - No data to display  Initial Impression / Assessment and Plan / UC Course  I have reviewed the triage vital signs and the nursing notes.  Pertinent labs & imaging results that were available during my care of the patient were reviewed by me and considered in my medical decision making (see chart for details).     We will treat for pain and inflammation with meloxicam since the prednisone didn't work Flexeril for muscle spasms in the left leg.  Instructed to follow up if not better in the next 3 to 4 days.  Pt mom with him and both understanding of the plan.  Final  Clinical Impressions(s) / UC Diagnoses   Final diagnoses:  Arthralgia of left lower leg  Muscle spasm     Discharge Instructions     We will go ahead and treat you your pain with meloxicam daily. We will treat the muscle spasms with Flexeril.  Be aware this may make  you drowsy. Follow up as needed for continued or worsening symptoms     ED Prescriptions    Medication Sig Dispense Auth. Provider   meloxicam (MOBIC) 7.5 MG tablet Take 1 tablet (7.5 mg total) by mouth daily. 30 tablet Amyrie Illingworth A, NP   cyclobenzaprine (FLEXERIL) 10 MG tablet Take 1 tablet (10 mg total) by mouth 2 (two) times daily as needed for muscle spasms. 20 tablet Dahlia Byes A, NP     Controlled Substance Prescriptions [place] Controlled Substance Registry consulted? Not Applicable   Janace Aris, NP 04/03/18 1227

## 2018-04-05 NOTE — Progress Notes (Signed)
[facility] Note  04/08/2018     CHIEF COMPLAINT Patient presents for Retina Follow Up   HISTORY OF PRESENT ILLNESS: Eric Stephens is a 37 y.o. male who presents to the clinic today for:   HPI    Retina Follow Up    In both eyes.  This started 2 months ago.  Severity is mild.  Since onset it is stable.  I, the attending physician,  performed the HPI with the patient and updated documentation appropriately.          Comments    F/U Lattice Degen. OU. Patient states "I can see good", Denies new visual onsets. Pt is ready for tx today os.       Last edited by Bernarda Caffey, MD on 04/08/2018 10:39 AM. (History)       Referring physician: Kathi Ludwig, MD [place], [place] [id number]  HISTORICAL INFORMATION:   Selected notes from the MEDICAL RECORD NUMBER Referred by Dr. Maryjane Hurter for retinal evaluation LEE: 07.25.19 (M. Le) [BCVA: OD: 20/60- OS: 20/60-] Ocular Hx-cataracts, HTN ret PMH-syphilis, schizophrenia, former smoker    CURRENT MEDICATIONS: Current Outpatient Medications (Ophthalmic Drugs)  Medication Sig  . prednisoLONE acetate (PRED FORTE) 1 % ophthalmic suspension Place 1 drop into the left eye 4 (four) times daily for 7 days.   No current facility-administered medications for this visit.  (Ophthalmic Drugs)   Current Outpatient Medications (Other)  Medication Sig  . cetirizine (ZYRTEC) 10 MG tablet Take 1 tablet (10 mg total) by mouth daily. (Patient taking differently: Take 10 mg by mouth daily as needed for allergies. )  . [employer] SALINE NOSE SPRAY 0.65 % nasal spray PLACE 1 SPRAY INTO BOTH NOSTRILS AS NEEDED FOR CONGESTION.  . cyclobenzaprine (FLEXERIL) 10 MG tablet Take 1 tablet (10 mg total) by mouth 2 (two) times daily as needed for muscle spasms.  [name] gabapentin (NEURONTIN) 600 MG tablet TAKE 1 TABLET BY MOUTH TWICE DAILY FOR ANXIETY AND IRRITABILITY  . LORazepam (ATIVAN) 1 MG tablet Take 1 mg by mouth 2 (two) times  daily.  . meloxicam (MOBIC) 7.5 MG tablet Take 1 tablet (7.5 mg total) by mouth daily.  . naproxen (NAPROSYN) 375 MG tablet Take 1 tablet (375 mg total) by mouth 2 (two) times daily.  . predniSONE (DELTASONE) 20 MG tablet Two daily with food  . PROVENTIL HFA 108 (90 Base) MCG/ACT inhaler TAKE 2 PUFFS BY MOUTH EVERY 6 HOURS AS NEEDED FOR WHEEZE OR SHORTNESS OF BREATH  . zolpidem (AMBIEN) 10 MG tablet Take 10 mg by mouth at bedtime as needed for sleep.  . benzonatate (TESSALON) 100 MG capsule Take 1 capsule (100 mg total) by mouth 3 (three) times daily as needed for cough. (Patient not taking: Reported on 04/08/2018)   No current facility-administered medications for this visit.  (Other)      REVIEW OF SYSTEMS: ROS    Positive for: Eyes   Negative for: Constitutional, Gastrointestinal, Neurological, Skin, Genitourinary, Musculoskeletal, HENT, Endocrine, Cardiovascular, Psychiatric, Allergic/Imm, Heme/Lymph   Last edited by Zenovia Jordan, LPN on 62/94/7654  6:50 AM. (History)       ALLERGIES No Known Allergies  PAST MEDICAL HISTORY Past Medical History:  Diagnosis Date  . Asthma   . Syphilis    Past Surgical History:  Procedure Laterality Date  . WRIST SURGERY      FAMILY HISTORY Family History  Problem Relation Age of Onset  . Hypertension Father   .  Hypertension Mother   . Arthritis Mother     SOCIAL HISTORY Social History   Tobacco Use  . Smoking status: Current Some Day Smoker    Packs/day: 0.50    Types: Cigarettes  . Smokeless tobacco: Never Used  Substance Use Topics  . Alcohol use: No  . Drug use: No         OPHTHALMIC EXAM:  Base Eye Exam    Visual Acuity (Snellen - Linear)      Right Left   Dist cc 20/60 -1 20/70 -2   Dist ph cc 20/60 20/70 -1   Correction:  Glasses       Tonometry (Tonopen, 9:55 AM)      Right Left   Pressure 18 18       Pupils      Dark Light Shape React APD   Right 4 3 Round Brisk None   Left 4 3 Round Brisk  None       Visual Fields (Counting fingers)      Left Right    Full Full       Extraocular Movement      Right Left    Full, Ortho Full, Ortho       Neuro/Psych    Oriented x3:  Yes   Mood/Affect:  Normal       Dilation    Both eyes:  1.0% Mydriacyl, 2.5% Phenylephrine @ 9:51 AM        Slit Lamp and Fundus Exam    Slit Lamp Exam      Right Left   Lids/Lashes Meibomian gland dysfunction Meibomian gland dysfunction   Conjunctiva/Sclera Melanosis Melanosis   Cornea Arcus Clear   Anterior Chamber Deep and quiet Deep and quiet   Iris Round and dilated Round and dilated   Lens 1+ Nuclear sclerosis, 2+ Cortical cataract 1+ Nuclear sclerosis, 2+ Cortical cataract   Vitreous Vitreous syneresis, prominent vitreous base temporally Vitreous syneresis, prominent vitreous base        Fundus Exam      Right Left   Disc Temporal Pallor, Temporal Peripapillary atrophy Temporal Pallor, 360 Peripapillary atrophy   C/D Ratio 0.6 0.6   Macula Blunted foveal reflex, No heme or edema Blunted foveal reflex, RPE mottling and clumping   Vessels Vascular attenuation Vascular attenuation   Periphery Attached, inferotemporal pigment changes, lattice degeneration from [phone] with focal area of pigmentation at 0430, small operculated hole at 1030 -- good laser changes surrounding, prominent vitreous base Attached, Operculum at 0100 and 0230 with good laser changes surrounding, small areas of pigmented lattice at 0500 -- early laser changes, but incomplete, prominent vitreous base          IMAGING AND PROCEDURES  Imaging and Procedures for '@TODAY'$ @  Repair Retinal Breaks, Laser - OS - Left Eye       LASER PROCEDURE NOTE  Procedure:  Barrier laser retinopexy using slit lamp laser, LEFT eye   Diagnosis:   Retinal holes, LEFT eye                         Lattice Degeneration, LEFT eye -- 0500 ant to equator                     Small opercula at 1 and 230 o'clock anterior to equator    Surgeon: Bernarda Caffey, MD, PhD  Anesthesia: Topical  Informed consent obtained, operative eye marked, and time out performed prior to  initiation of laser.   Laser settings:  Lumenis Smart532 laser, slit lamp Lens: Mainster PRP 165 Power: 280 mW Spot size: 200 microns Duration: 30 msec  # spots: 397  Placement of laser: Using a Mainster PRP 165 contact lens at the slit lamp, laser was placed in three confluent rows around opercula at 1 and 230 oclock anterior to equator with additional rows anteriorly and 0500 pigmented lattice.  Complications: None.  Patient tolerated the procedure well and received written and verbal post-procedure care information/education.                  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 Retinal breaks without detachment H35.89 Repair Retinal Breaks, Laser - OS - Left Eye  3. 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 Retinal edema H35.81   5. 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 Hx of syphilis Z86.19     1,2. Lattice degeneration w/ atrophic holes, OU - OD: pigmented lattice 0430-0600, small operculated hole at 1030 - OS: operculum at 0100/0115, small area of pigmented lattice at 0900 - S/P laser retinopexy OD (08.21.19) -- good laser surrounding - S/P laser retinopexy OS (09.04.19) -- incomplete laser - finished PF OS QID x 7 days - recommend touch up laser OS - RBA of procedure discussed, questions answered - informed consent obtained and signed - see procedure note - f/u 3-4 weeks for POV  3. High myopia OU - under the expert refractive management of Dr. Marin Comment - OCT shows diffuse thinning of retina - discussed findings and prognosis associated with high myopia / degenerative myopia  4. No retinal edema on exam or OCT  5. Nuclear sclerosis OU-  - The symptoms of cataract, surgical options, and treatments and risks were discussed with  patient. - discussed diagnosis and progression - not yet visually significant - monitor for now  6. History of Neurosyphilis - quant RPR 1:16 on 10/2017 from 1:32 in 2017 - FA on 8.16.19 - normal study without leakage, hyperfluorescence or vasculitis - no ocular involvement; no inflammation in anterior or posterior segments    Ophthalmic Meds Ordered this visit:  Meds ordered this encounter  Medications  . prednisoLONE acetate (PRED FORTE) 1 % ophthalmic suspension    Sig: Place 1 drop into the left eye 4 (four) times daily for 7 days.    Dispense:  10 mL    Refill:  0       Return in about 4 weeks (around 05/06/2018) for F/U laser retinopexy.  There are no Patient Instructions on file for this visit.   Explained the diagnoses, plan, and follow up with the patient and they expressed understanding.  Patient expressed understanding of the importance of proper follow up care.   This document serves as a record of services personally performed by Gardiner Sleeper, MD, PhD. It was created on their behalf by Ernest Mallick, OA, an ophthalmic assistant. The creation of this record is the provider's dictation and/or activities during the visit.    Electronically signed by: Ernest Mallick, OA 10.18.19 12:01 PM    Gardiner Sleeper, M.D., Ph.D. Diseases & Surgery of the Retina and Vitreous [facility]   I have reviewed the above documentation for accuracy and completeness, and I agree with the above. Gardiner Sleeper, M.D., Ph.D. 04/08/18 12:01 PM    Abbreviations:  M myopia (nearsighted); A astigmatism; H hyperopia (farsighted); P presbyopia; Mrx spectacle prescription;  CTL contact lenses; OD right eye; OS left eye; OU both eyes  XT exotropia; ET esotropia; PEK punctate epithelial keratitis; PEE punctate epithelial erosions; DES dry eye syndrome; MGD meibomian gland dysfunction; ATs artificial tears; PFAT's preservative free artificial tears; [place] nuclear sclerotic  cataract; PSC posterior subcapsular cataract; ERM epi-retinal membrane; PVD posterior vitreous detachment; RD retinal detachment; DM diabetes mellitus; DR diabetic retinopathy; NPDR non-proliferative diabetic retinopathy; PDR proliferative diabetic retinopathy; CSME clinically significant macular edema; DME diabetic macular edema; dbh dot blot hemorrhages; CWS cotton wool spot; POAG primary open angle glaucoma; C/D cup-to-disc ratio; HVF humphrey visual field; GVF goldmann visual field; OCT optical coherence tomography; IOP intraocular pressure; BRVO Branch retinal vein occlusion; CRVO central retinal vein occlusion; CRAO central retinal artery occlusion; BRAO branch retinal artery occlusion; RT retinal tear; SB scleral buckle; PPV pars plana vitrectomy; VH Vitreous hemorrhage; PRP panretinal laser photocoagulation; IVK intravitreal kenalog; VMT vitreomacular traction; MH Macular hole;  NVD neovascularization of the disc; NVE neovascularization elsewhere; AREDS age related eye disease study; ARMD age related macular degeneration; POAG primary open angle glaucoma; EBMD epithelial/anterior basement membrane dystrophy; ACIOL anterior chamber intraocular lens; IOL intraocular lens; PCIOL posterior chamber intraocular lens; Phaco/IOL phacoemulsification with intraocular lens placement; [place] photorefractive keratectomy; LASIK laser assisted in situ keratomileusis; HTN hypertension; DM diabetes mellitus; COPD chronic obstructive pulmonary disease

## 2018-04-08 ENCOUNTER — Ambulatory Visit (INDEPENDENT_AMBULATORY_CARE_PROVIDER_SITE_OTHER): Payer: Medicaid Other | Admitting: Ophthalmology

## 2018-04-08 ENCOUNTER — Encounter (INDEPENDENT_AMBULATORY_CARE_PROVIDER_SITE_OTHER): Payer: Self-pay | Admitting: Ophthalmology

## 2018-04-08 DIAGNOSIS — Z8619 Personal history of other infectious and parasitic diseases: Secondary | ICD-10-CM

## 2018-04-08 DIAGNOSIS — H35413 Lattice degeneration of retina, bilateral: Secondary | ICD-10-CM

## 2018-04-08 DIAGNOSIS — H3589 Other specified retinal disorders: Secondary | ICD-10-CM | POA: Diagnosis not present

## 2018-04-08 DIAGNOSIS — H2513 Age-related nuclear cataract, bilateral: Secondary | ICD-10-CM

## 2018-04-08 DIAGNOSIS — H3581 Retinal edema: Secondary | ICD-10-CM

## 2018-04-08 DIAGNOSIS — H5213 Myopia, bilateral: Secondary | ICD-10-CM

## 2018-04-08 MED ORDER — PREDNISOLONE ACETATE 1 % OP SUSP: 1.0000 [drp] | Freq: Four times a day (QID) | OPHTHALMIC | 0 refills | Status: AC

## 2018-05-01 ENCOUNTER — Encounter: Payer: Self-pay | Admitting: Internal Medicine

## 2018-05-01 ENCOUNTER — Ambulatory Visit: Payer: Medicaid Other | Admitting: Internal Medicine

## 2018-05-01 VITALS — BP 122/64 | HR 66 | Temp 98.8°F | Wt 233.0 lb

## 2018-05-01 DIAGNOSIS — G4700 Insomnia, unspecified: Secondary | ICD-10-CM

## 2018-05-01 DIAGNOSIS — Z79899 Other long term (current) drug therapy: Secondary | ICD-10-CM | POA: Diagnosis not present

## 2018-05-01 DIAGNOSIS — G2401 Drug induced subacute dyskinesia: Secondary | ICD-10-CM

## 2018-05-01 DIAGNOSIS — F419 Anxiety disorder, unspecified: Secondary | ICD-10-CM

## 2018-05-01 DIAGNOSIS — A5217 General paresis: Secondary | ICD-10-CM | POA: Diagnosis not present

## 2018-05-01 DIAGNOSIS — F0281 Dementia in other diseases classified elsewhere with behavioral disturbance: Secondary | ICD-10-CM

## 2018-05-01 DIAGNOSIS — F02818 Dementia in other diseases classified elsewhere, unspecified severity, with other behavioral disturbance: Secondary | ICD-10-CM

## 2018-05-01 DIAGNOSIS — Z23 Encounter for immunization: Secondary | ICD-10-CM | POA: Diagnosis not present

## 2018-05-01 DIAGNOSIS — A523 Neurosyphilis, unspecified: Secondary | ICD-10-CM

## 2018-05-01 DIAGNOSIS — F0280 Dementia in other diseases classified elsewhere without behavioral disturbance: Secondary | ICD-10-CM

## 2018-05-01 NOTE — Patient Instructions (Signed)
FOLLOW-UP INSTRUCTIONS When: 3-4 months For: Routine visit What to bring: All of your medications  I have not made any changes to your medications today.   Today we discussed your dementia and need for additional assistance due to neurosyphilis. Our office will be in contact with the necessary personal to better enable a timely arrangement of the care moving forward.   [facility]. If you have any questions or concerns please call us at [phone].

## 2018-05-01 NOTE — Progress Notes (Signed)
CC: for evaluation of his cognitive impairment 2/2 neurosyphilis   HPI:[name] is a 37 y.o. male who presents for evaluation of his current medical and mental health status. Please see individual problem based A/P for details.  Neurosyphilis with Intelectual disability: First diagnosed and treated around 2000 as per chart review. It was recorded that he had a negative RPR in 2017 while undergoing testing for HIV. The patient then demonstrated symptoms similar to schizophrenia (auditory hallucinations, anhedonia, disordered thought processes) and was hospitalized twice for this while undergoing treatment with antipsychotics. [facility] after his second hospitalization and underwent a total of 37 ECT treatments to break this. A full medical evaluation at that time revealed a positive RPR and VRDL prompting repeat treatment for neurosyphilis. 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 He denied auditory hallucinations currently but continues to endorse anxiety. Although unable to speak clearly, he demonstrates confirmation or disagreement by nodding or shaking his head appropriately. He continues to lose items regularly including new expensive cell phones, routinely becomes lost while out if alone, his verbal comprehension by nonfamily members is markedly limited, the patient often forgets to take his medications despite his parents constant reminding, and he is generally under-stimulated at home.   The patient was accompanied by his father and mother who state that they all have increasing health requirements. In addition the father continues to work outside the home while in his mid 5170's and the mother, also in her 1170's, remains at home with the patient. They currently are unable to meet the patients needs. The mother is additionally concerned that she will increasingly fall behind on meeting the  patients mental, physical and emotional needs as they age. They are unable to provide sufficient transportation, and are concerned about his safety as he wonders and gets lost when alone. The patient is reportedly able to perform many of his ADL's such as bathing, and feeding himself but can not "cook" or clean efficiently. They are also concerned that as they age their risk of death increases and they feel that their son will need assistance after their death. The patients parents are aware of his health issues, they keep up to date on his medications and prepare his medications weekly in a pill dispenser. They are able to provide basic care but can not meet his increasing needs.   After extensive discussion with the patients parents it is apparent that he will require assistance above what they can offer. In addition, as they are able to provide assistance equal to a home health RN, I do not feel that an in home option is most appropriate. I agree that continuing to pursue a group home would be in the patients best interest as his needs progress in conjunction with the age of his parents.    Plan: We will assist in initiating the process whereby we attempt to acquire placement for Eric Stephens to better meet his medical and mental health needs.  Continue Gabapentin for neuropathic pain, irritability and anxiety at 600mg  BID  The patient is provided Zolpidem 10mg  QHS by his [facility] clinician due to insomnia.   Past Medical History:  Diagnosis Date  . Asthma   . Syphilis    Review of Systems:  ROS negative except as per HPI.   Physical Exam: Vitals:   05/01/18 1319  BP: 122/64  Pulse: 66  Temp: 98.8 F (37.1 C)  TempSrc: Oral  SpO2: 98%  Weight: 233 lb (105.7 kg)   General: Alert, oriented to person, place and generally to reason for visit. Afebrile, nondiaphoretic, in no acute distress. Cardio: RRR, no mrg's Pulmonary: CTA bilaterally  Assessment & Plan:   See Encounters Tab for problem  based charting.  Patient discussed with Dr. Josem Kaufmann

## 2018-05-02 ENCOUNTER — Encounter: Payer: Self-pay | Admitting: Internal Medicine

## 2018-05-02 DIAGNOSIS — Z23 Encounter for immunization: Secondary | ICD-10-CM | POA: Insufficient documentation

## 2018-05-02 NOTE — Assessment & Plan Note (Signed)
Patient provided immunization against influenza.

## 2018-05-02 NOTE — Progress Notes (Signed)
Pt left before being seen by MD  Karie ChimeraBrian G. Delanna Blacketer, M.D., Ph.D. Diseases & Surgery of the Retina and Vitreous [facility] 05/12/18

## 2018-05-02 NOTE — Assessment & Plan Note (Signed)
Neurosyphilis with Intelectual disability: First diagnosed and treated around 2000 as per chart review. It was recorded that he had a negative RPR in 2017 while undergoing testing for HIV. The patient then demonstrated symptoms similar to schizophrenia (auditory hallucinations, anhedonia, disordered thought processes) and was hospitalized twice for this while undergoing treatment with antipsychotics. He was [facility] symptoms after his second hospitalization and underwent a total of 37 ECT treatments to break this. A full medical evaluation at that time revealed a positive RPR and VRDL prompting repeat treatment for neurosyphilis. 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 He denied auditory hallucinations currently but continues to endorse anxiety. Although unable to speak clearly, he demonstrates confirmation or disagreement by nodding or shaking his head appropriately. He continues to lose items regularly including new expensive cell phones, routinely becomes lost while out if alone, his verbal comprehension by nonfamily members is markedly limited, the patient often forgets to take his medications despite his parents constant reminding, and he is generally under-stimulated at home.   The patient was accompanied by his father and mother who state that they all have increasing health requirements. In addition the father continues to work outside the home while in his mid 470's and the mother, also in her 2770's, remains at home with the patient. They currently are unable to meet the patients needs. The mother is additionally concerned that she will increasingly fall behind on meeting the patients mental, physical and emotional needs as they age. They are unable to provide sufficient transportation, and are concerned about his safety as he wonders and gets lost when alone. The patient is reportedly able to perform many of his ADL's such  as bathing, and feeding himself but can not "cook" or clean efficiently. They are also concerned that as they age their risk of death increases and they feel that their son will need assistance after their death. The patients parents are aware of his health issues, they keep up to date on his medications and prepare his medications weekly in a pill dispenser. They are able to provide basic care but can not meet his increasing needs.   After extensive discussion with the patients parents it is apparent that he will require assistance above what they can offer. In addition, as they are able to provide assistance equal to a home health RN, I do not feel that an in home option is most appropriate. I agree that continuing to pursue a group home would be in the patients best interest as his needs progress in conjunction with the age of his parents.    Plan: We will assist in initiating the process whereby we attempt to acquire placement for Mr. Heeney to better meet his medical and mental health needs.  Continue Gabapentin for neuropathic pain, irritability and anxiety at 600mg  BID  The patient is provided Zolpidem 10mg  QHS [facility] due to insomnia.

## 2018-05-02 NOTE — Progress Notes (Signed)
Case discussed with Dr. Harbrecht at the time of the visit. We reviewed the resident's history and exam and pertinent patient test results. I agree with the assessment, diagnosis, and plan of care documented in the resident's note 

## 2018-05-02 NOTE — Assessment & Plan Note (Signed)
Patient provided TDAP vaccination

## 2018-05-02 NOTE — Assessment & Plan Note (Signed)
See neurosyphilis A/P from same date.

## 2018-05-03 ENCOUNTER — Ambulatory Visit: Payer: Medicaid Other | Admitting: Podiatry

## 2018-05-03 ENCOUNTER — Encounter: Payer: Self-pay | Admitting: Podiatry

## 2018-05-03 DIAGNOSIS — A523 Neurosyphilis, unspecified: Secondary | ICD-10-CM

## 2018-05-03 DIAGNOSIS — M79674 Pain in right toe(s): Secondary | ICD-10-CM | POA: Diagnosis not present

## 2018-05-03 DIAGNOSIS — M79675 Pain in left toe(s): Secondary | ICD-10-CM

## 2018-05-03 DIAGNOSIS — B351 Tinea unguium: Secondary | ICD-10-CM | POA: Diagnosis not present

## 2018-05-03 DIAGNOSIS — L84 Corns and callosities: Secondary | ICD-10-CM

## 2018-05-03 DIAGNOSIS — G63 Polyneuropathy in diseases classified elsewhere: Secondary | ICD-10-CM

## 2018-05-06 ENCOUNTER — Encounter (INDEPENDENT_AMBULATORY_CARE_PROVIDER_SITE_OTHER): Payer: Self-pay

## 2018-05-06 ENCOUNTER — Encounter (INDEPENDENT_AMBULATORY_CARE_PROVIDER_SITE_OTHER): Payer: Self-pay | Admitting: Ophthalmology

## 2018-05-06 ENCOUNTER — Encounter (INDEPENDENT_AMBULATORY_CARE_PROVIDER_SITE_OTHER): Payer: Medicaid Other | Admitting: Ophthalmology

## 2018-05-12 NOTE — Progress Notes (Signed)
This encounter was created in error - please disregard.

## 2018-05-14 NOTE — Progress Notes (Signed)
[facility] Note  05/20/2018     CHIEF COMPLAINT Patient presents for Post-op Follow-up   HISTORY OF PRESENT ILLNESS: Eric Stephens is a 37 y.o. male who presents to the clinic today for:   HPI    Post-op Follow-up    In both eyes.  Discomfort includes Negative for pain, itching, foreign body sensation, tearing, discharge, floaters and none.  Vision is stable.  I, the attending physician,  performed the HPI with the patient and updated documentation appropriately.          Comments    S/P laser for lattice with holes OU- Patient states no new floaters/FOL, vision seems the same OU.       Last edited by Bernarda Caffey, MD on 05/20/2018 11:05 AM. (History)     pt states eyes are doing well  Referring physician: Kathi Ludwig, MD [place], [place] [address]  HISTORICAL INFORMATION:   Selected notes from the MEDICAL RECORD NUMBER Referred by Dr. Maryjane Hurter for retinal evaluation LEE: 07.25.19 (M. Le) [BCVA: OD: 20/60- OS: 20/60-] Ocular Hx-cataracts, HTN ret PMH-syphilis, schizophrenia, former smoker    CURRENT MEDICATIONS: No current outpatient medications on file. (Ophthalmic Drugs)   No current facility-administered medications for this visit.  (Ophthalmic Drugs)   Current Outpatient Medications (Other)  Medication Sig  . cetirizine (ZYRTEC) 10 MG tablet Take 1 tablet (10 mg total) by mouth daily. (Patient taking differently: Take 10 mg by mouth daily as needed for allergies. )  . [employer] SALINE NOSE SPRAY 0.65 % nasal spray PLACE 1 SPRAY INTO BOTH NOSTRILS AS NEEDED FOR CONGESTION.  . cyclobenzaprine (FLEXERIL) 10 MG tablet Take 1 tablet (10 mg total) by mouth 2 (two) times daily as needed for muscle spasms.  [name] gabapentin (NEURONTIN) 600 MG tablet TAKE 1 TABLET BY MOUTH TWICE DAILY FOR ANXIETY AND IRRITABILITY  . PROVENTIL HFA 108 (90 Base) MCG/ACT inhaler TAKE 2 PUFFS BY MOUTH EVERY 6 HOURS AS NEEDED FOR WHEEZE OR SHORTNESS OF BREATH   . zolpidem (AMBIEN) 10 MG tablet Take 10 mg by mouth at bedtime as needed.   No current facility-administered medications for this visit.  (Other)      REVIEW OF SYSTEMS: ROS    Positive for: Eyes   Negative for: Constitutional, Gastrointestinal, Neurological, Skin, Genitourinary, Musculoskeletal, HENT, Endocrine, Cardiovascular, Respiratory, Psychiatric, Allergic/Imm, Heme/Lymph   Last edited by Roselee Nova D on 05/20/2018  9:44 AM. (History)       ALLERGIES No Known Allergies  PAST MEDICAL HISTORY Past Medical History:  Diagnosis Date  . Asthma   . Syphilis    Past Surgical History:  Procedure Laterality Date  . WRIST SURGERY      FAMILY HISTORY Family History  Problem Relation Age of Onset  . Hypertension Father   . Hypertension Mother   . Arthritis Mother     SOCIAL HISTORY Social History   Tobacco Use  . Smoking status: Current Some Day Smoker    Packs/day: 0.50    Types: Cigarettes  . Smokeless tobacco: Never Used  Substance Use Topics  . Alcohol use: No  . Drug use: No         OPHTHALMIC EXAM:  Base Eye Exam    Visual Acuity (Snellen - Linear)      Right Left   Dist cc 20/40 -2 20/60   Dist ph cc NI NI   Correction:  Glasses       Tonometry (Tonopen,  9:56 AM)      Right Left   Pressure 31 26       Tonometry #2 (Tonopen, 10:45 AM)      Right Left   Pressure 27 18       Tonometry #3 (Tonopen, 10:45 AM)      Right Left   Pressure 23        Pupils      Dark Light Shape React APD   Right 4 3 Round Slow None   Left 4 3 Round Slow None       Visual Fields (Counting fingers)      Left Right    Full Full       Extraocular Movement      Right Left    Full, Ortho Full, Ortho       Neuro/Psych    Oriented x3:  Yes   Mood/Affect:  Normal       Dilation    Both eyes:  1.0% Mydriacyl, 2.5% Phenylephrine @ 9:56 AM        Slit Lamp and Fundus Exam    Slit Lamp Exam      Right Left   Lids/Lashes Meibomian gland  dysfunction Meibomian gland dysfunction   Conjunctiva/Sclera Melanosis Melanosis   Cornea Arcus Clear   Anterior Chamber Deep and clear Deep and quiet   Iris Round and dilated Round and dilated   Lens 1+ Nuclear sclerosis, 2+ Cortical cataract 1+ Nuclear sclerosis, 2+ Cortical cataract   Vitreous Vitreous syneresis, prominent vitreous base temporally Vitreous syneresis, prominent vitreous base        Fundus Exam      Right Left   Disc trace Pallor, Temporal Peripapillary atrophy trace Pallor, 360 Peripapillary atrophy   C/D Ratio 0.7 0.6   Macula Blunted foveal reflex, No heme or edema Blunted foveal reflex, RPE mottling and clumping   Vessels Vascular attenuation Vascular attenuation   Periphery Attached, inferotemporal pigment changes, lattice degeneration from [phone] with focal area of pigmentation at 0430, small operculated hole at 1030 -- good laser changes surrounding, prominent vitreous base, White without pressure IT quadrant Attached, Operculum at 0100 and 0230 with good laser changes surrounding, small areas of pigmented lattice at 0500 -- good laser changes, prominent vitreous base, scattered White without pressure          IMAGING AND PROCEDURES  Imaging and Procedures for '@TODAY'$ @  OCT, Retina - OU - Both Eyes       Right Eye Quality was good. Central Foveal Thickness: 271. Progression has been stable. Findings include normal foveal contour, no IRF, no SRF, myopic contour.   Left Eye Quality was good. Central Foveal Thickness: 268. Progression has been stable. Findings include no IRF, no SRF, normal foveal contour, myopic contour.   Notes *Images captured and stored on drive  Diagnosis / Impression:  Myopic contour; NFP; No IRF/SRF  Clinical management:  See below  Abbreviations: NFP - Normal foveal profile. CME - cystoid macular edema. PED - pigment epithelial detachment. IRF - intraretinal fluid. SRF - subretinal fluid. EZ - ellipsoid zone. ERM - epiretinal  membrane. ORA - outer retinal atrophy. ORT - outer retinal tubulation. 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  Retinal edema H35.81 OCT, Retina - OU - Both Eyes  5. 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 Hx of syphilis Z86.19   7. 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 Lattice degeneration w/ atrophic holes, OU - OD: pigmented lattice 0430-0600, small operculated hole at 1030 - OS: opercula at 0100 and 0230, lattice at 0500 - S/P laser retinopexy OD (08.21.19) -- good laser surrounding - S/P laser retinopexy OS (09.04.19), touch up laser (10.21.19) -- good laser surrounding - f/u 3-4 months POV  3. High myopia OU - under the expert refractive management of Dr. Marin Comment - OCT shows diffuse thinning of retina - discussed findings and prognosis associated with high myopia / degenerative myopia  4. No retinal edema on exam or OCT  5. Nuclear sclerosis OU-  - The symptoms of cataract, surgical options, and treatments and risks were discussed with patient. - discussed diagnosis and progression - not yet visually significant - monitor for now  6. History of Neurosyphilis - quant RPR 1:16 on 10/2017 from 1:32 in 2017 - FA on 8.16.19 - normal study without leakage, hyperfluorescence or vasculitis - no ocular involvement; no inflammation in anterior or posterior segments   7,8. Ocular hypertension; Glaucoma Suspect - IOP today, 31/26 first reading, 27/18 second reading - ?mild increase in C/D from 0.6 to 0.7 - start brimonidine BID OD only - refer to [facility] for glaucoma eval   Ophthalmic Meds Ordered this visit:  No orders of the defined types were placed in this encounter.      Return for F/U 3-4 months lattice OU,  DFE, OCT.  There are no Patient Instructions on file for this visit.   Explained the diagnoses, plan, and follow up with the patient and they expressed understanding.  Patient expressed understanding of the importance of proper follow up care.   This document serves as a record of services personally performed by Gardiner Sleeper, MD, PhD. It was created on their behalf by Ernest Mallick, OA, an ophthalmic assistant. The creation of this record is the provider's dictation and/or activities during the visit.    Electronically signed by: Ernest Mallick, OA  11.26.19 11:31 AM    Gardiner Sleeper, M.D., Ph.D. Diseases & Surgery of the Retina and Vitreous [facility]   I have reviewed the above documentation for accuracy and completeness, and I agree with the above. Gardiner Sleeper, M.D., Ph.D. 05/20/18 11:31 AM    Abbreviations: M myopia (nearsighted); A astigmatism; H hyperopia (farsighted); P presbyopia; Mrx spectacle prescription;  CTL contact lenses; OD right eye; OS left eye; OU both eyes  XT exotropia; ET esotropia; PEK punctate epithelial keratitis; PEE punctate epithelial erosions; DES dry eye syndrome; MGD meibomian gland dysfunction; ATs artificial tears; PFAT's preservative free artificial tears; [place] nuclear sclerotic cataract; PSC posterior subcapsular cataract; ERM epi-retinal membrane; PVD posterior vitreous detachment; RD retinal detachment; DM diabetes mellitus; DR diabetic retinopathy; NPDR non-proliferative diabetic retinopathy; PDR proliferative diabetic retinopathy; CSME clinically significant macular edema; DME diabetic macular edema; dbh dot blot hemorrhages; CWS cotton wool spot; POAG primary open angle glaucoma; C/D cup-to-disc ratio; HVF humphrey visual field; GVF goldmann visual field; OCT optical coherence tomography; IOP intraocular pressure; BRVO Branch retinal vein occlusion; CRVO central retinal vein occlusion; CRAO central retinal artery occlusion;  BRAO branch retinal artery occlusion; RT retinal tear; SB scleral buckle; PPV pars plana vitrectomy; VH  Vitreous hemorrhage; PRP panretinal laser photocoagulation; IVK intravitreal kenalog; VMT vitreomacular traction; MH Macular hole;  NVD neovascularization of the disc; NVE neovascularization elsewhere; AREDS age related eye disease study; ARMD age related macular degeneration; POAG primary open angle glaucoma; EBMD epithelial/anterior basement membrane dystrophy; ACIOL anterior chamber intraocular lens; IOL intraocular lens; PCIOL posterior chamber intraocular lens; Phaco/IOL phacoemulsification with intraocular lens placement; [place] photorefractive keratectomy; LASIK laser assisted in situ keratomileusis; HTN hypertension; DM diabetes mellitus; COPD chronic obstructive pulmonary disease

## 2018-05-20 ENCOUNTER — Encounter: Payer: Self-pay | Admitting: Podiatry

## 2018-05-20 ENCOUNTER — Ambulatory Visit (INDEPENDENT_AMBULATORY_CARE_PROVIDER_SITE_OTHER): Payer: Medicaid Other | Admitting: Ophthalmology

## 2018-05-20 ENCOUNTER — Encounter (INDEPENDENT_AMBULATORY_CARE_PROVIDER_SITE_OTHER): Payer: Self-pay | Admitting: Ophthalmology

## 2018-05-20 DIAGNOSIS — H3589 Other specified retinal disorders: Secondary | ICD-10-CM

## 2018-05-20 DIAGNOSIS — H35413 Lattice degeneration of retina, bilateral: Secondary | ICD-10-CM | POA: Diagnosis not present

## 2018-05-20 DIAGNOSIS — H5213 Myopia, bilateral: Secondary | ICD-10-CM | POA: Diagnosis not present

## 2018-05-20 DIAGNOSIS — Z8619 Personal history of other infectious and parasitic diseases: Secondary | ICD-10-CM

## 2018-05-20 DIAGNOSIS — H2513 Age-related nuclear cataract, bilateral: Secondary | ICD-10-CM

## 2018-05-20 DIAGNOSIS — H40001 Preglaucoma, unspecified, right eye: Secondary | ICD-10-CM

## 2018-05-20 DIAGNOSIS — H40051 Ocular hypertension, right eye: Secondary | ICD-10-CM

## 2018-05-20 DIAGNOSIS — H3581 Retinal edema: Secondary | ICD-10-CM | POA: Diagnosis not present

## 2018-05-20 NOTE — Progress Notes (Signed)
Subjective: Eric Stephens presents with cc of painful, discolored, thick toenails and painful calluses which interfere with activities of daily living. Pain is aggravated when wearing enclosed shoe gear. Pain is relieved with periodic professional debridement.  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 This is managed with gabapentin.  Objective: Vascular Examination: Capillary refill time <3 seconds x 10 digits Dorsalis pedis and Posterior tibial pulses present b/l No digital hair x 10 digits Skin temperature gradient WNL b/l  Dermatological Examination: Skin with normal turgor, texture and tone b/l  Toenails 1-5 b/l discolored, thick, dystrophic with subungual debris and pain with palpation to nailbeds due to thickness of nails.  Hyperkeratotic lesion submet head 2 b/l with no erythema, no edema, no drainage. +Tenderness to palpation.  Musculoskeletal: Muscle strength 5/5 to all LE muscle groups  Pes planus b/l  No pain with calf compression b/l  Neurological: Sensation intact with 10 gram monofilament. Vibratory sensation diminished  Assessment: 1. Painful onychomycosis toenails 1-5 b/l 2. Calluses submet head 2 b/l 3. 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  Toenails 1-5 b/l were debrided in length and girth without iatrogenic bleeding. Hyperkeratotic lesion pared submet head 2 b/l with sterile chisel blade 1. Patient to continue soft, supportive shoe gear 2. Patient to report any pedal injuries to medical professional  3. Follow up 3 months. Patient/POA to call should there be a concern in the interim.

## 2018-05-27 ENCOUNTER — Ambulatory Visit (INDEPENDENT_AMBULATORY_CARE_PROVIDER_SITE_OTHER): Payer: Medicaid Other | Admitting: *Deleted

## 2018-05-27 DIAGNOSIS — Z111 Encounter for screening for respiratory tuberculosis: Secondary | ICD-10-CM | POA: Diagnosis not present

## 2018-05-27 DIAGNOSIS — Z23 Encounter for immunization: Secondary | ICD-10-CM

## 2018-05-29 ENCOUNTER — Other Ambulatory Visit (INDEPENDENT_AMBULATORY_CARE_PROVIDER_SITE_OTHER): Payer: Self-pay

## 2018-05-29 ENCOUNTER — Encounter: Payer: Self-pay | Admitting: *Deleted

## 2018-05-29 LAB — TB SKIN TEST
Induration: 0 mm
TB Skin Test: NEGATIVE

## 2018-07-13 ENCOUNTER — Other Ambulatory Visit (INDEPENDENT_AMBULATORY_CARE_PROVIDER_SITE_OTHER): Payer: Self-pay | Admitting: Ophthalmology

## 2018-08-06 ENCOUNTER — Other Ambulatory Visit: Payer: Self-pay

## 2018-08-06 ENCOUNTER — Ambulatory Visit (INDEPENDENT_AMBULATORY_CARE_PROVIDER_SITE_OTHER): Payer: Medicaid Other | Admitting: Podiatry

## 2018-08-06 DIAGNOSIS — A523 Neurosyphilis, unspecified: Secondary | ICD-10-CM

## 2018-08-06 DIAGNOSIS — M79675 Pain in left toe(s): Secondary | ICD-10-CM | POA: Diagnosis not present

## 2018-08-06 DIAGNOSIS — M79674 Pain in right toe(s): Secondary | ICD-10-CM

## 2018-08-06 DIAGNOSIS — B351 Tinea unguium: Secondary | ICD-10-CM

## 2018-08-06 DIAGNOSIS — L84 Corns and callosities: Secondary | ICD-10-CM

## 2018-08-06 DIAGNOSIS — G63 Polyneuropathy in diseases classified elsewhere: Secondary | ICD-10-CM

## 2018-08-06 NOTE — Patient Instructions (Signed)
Onychomycosis/Fungal Toenails  WHAT IS IT? An infection that lies within the keratin of your nail plate that is caused by a fungus.  WHY ME? Fungal infections affect all ages, sexes, races, and creeds.  There may be many factors that predispose you to a fungal infection such as age, coexisting medical conditions such as diabetes, or an autoimmune disease; stress, medications, fatigue, genetics, etc.  Bottom line: fungus thrives in a warm, moist environment and your shoes offer such a location.  IS IT CONTAGIOUS? Theoretically, yes.  You do not want to share shoes, nail clippers or files with someone who has fungal toenails.  Walking around barefoot in the same room or sleeping in the same bed is unlikely to transfer the organism.  It is important to realize, however, that fungus can spread easily from one nail to the next on the same foot.  HOW DO WE TREAT THIS?  There are several ways to treat this condition.  Treatment may depend on many factors such as age, medications, pregnancy, liver and kidney conditions, etc.  It is best to ask your doctor which options are available to you.  1. No treatment.   Unlike many other medical concerns, you can live with this condition.  However for many people this can be a painful condition and may lead to ingrown toenails or a bacterial infection.  It is recommended that you keep the nails cut short to help reduce the amount of fungal nail. 2. Topical treatment.  These range from herbal remedies to prescription strength nail lacquers.  About 40-50% effective, topicals require twice daily application for approximately 9 to 12 months or until an entirely new nail has grown out.  The most effective topicals are medical grade medications available through physicians offices. 3. Oral antifungal medications.  With an 80-90% cure rate, the most common oral medication requires 3 to 4 months of therapy and stays in your system for a year as the new nail grows out.  Oral  antifungal medications do require blood work to make sure it is a safe drug for you.  A liver function panel will be performed prior to starting the medication and after the first month of treatment.  It is important to have the blood work performed to avoid any harmful side effects.  In general, this medication safe but blood work is required. 4. Laser Therapy.  This treatment is performed by applying a specialized laser to the affected nail plate.  This therapy is noninvasive, fast, and non-painful.  It is not covered by insurance and is therefore, out of pocket.  The results have been very good with a 80-95% cure rate.  The [facility] is the only practice in the area to offer this therapy. Permanent Nail Avulsion.  Removing the entire nail so that a new nail will not grow back.Corns and Calluses Corns are small areas of thickened skin that occur on the top, sides, or tip of a toe. They contain a cone-shaped core with a point that can press on a nerve below. This causes pain.  Calluses are areas of thickened skin that can occur anywhere on the body, including the hands, fingers, palms, soles of the feet, and heels. Calluses are usually larger than corns. What are the causes? Corns and calluses are caused by rubbing (friction) or pressure, such as from shoes that are too tight or do not fit properly. What increases the risk? Corns are more likely to develop in people who have misshapen   toes (toe deformities), such as hammer toes. Calluses can occur with friction to any area of the skin. They are more likely to develop in people who:  Work with their hands.  Wear shoes that fit poorly, are too tight, or are high-heeled.  Have toe deformities. What are the signs or symptoms? Symptoms of a corn or callus include:  A hard growth on the skin.  Pain or tenderness under the skin.  Redness and swelling.  Increased discomfort while wearing tight-fitting shoes, if your feet are affected. If a  corn or callus becomes infected, symptoms may include:  Redness and swelling that gets worse.  Pain.  Fluid, blood, or pus draining from the corn or callus. How is this diagnosed? Corns and calluses may be diagnosed based on your symptoms, your medical history, and a physical exam. How is this treated? Treatment for corns and calluses may include:  Removing the cause of the friction or pressure. This may involve: ? Changing your shoes. ? Wearing shoe inserts (orthotics) or other protective layers in your shoes, such as a corn pad. ? Wearing gloves.  Applying medicine to the skin (topical medicine) to help soften skin in the hardened, thickened areas.  Removing layers of dead skin with a file to reduce the size of the corn or callus.  Removing the corn or callus with a scalpel or laser.  Taking antibiotic medicines, if your corn or callus is infected.  Having surgery, if a toe deformity is the cause. Follow these instructions at home:   Take over-the-counter and prescription medicines only as told by your health care provider.  If you were prescribed an antibiotic, take it as told by your health care provider. Do not stop taking it even if your condition starts to improve.  Wear shoes that fit well. Avoid wearing high-heeled shoes and shoes that are too tight or too loose.  Wear any padding, protective layers, gloves, or orthotics as told by your health care provider.  Soak your hands or feet and then use a file or pumice stone to soften your corn or callus. Do this as told by your health care provider.  Check your corn or callus every day for symptoms of infection. Contact a health care provider if you:  Notice that your symptoms do not improve with treatment.  Have redness or swelling that gets worse.  Notice that your corn or callus becomes painful.  Have fluid, blood, or pus coming from your corn or callus.  Have new symptoms. Summary  Corns are small areas of  thickened skin that occur on the top, sides, or tip of a toe.  Calluses are areas of thickened skin that can occur anywhere on the body, including the hands, fingers, palms, and soles of the feet. Calluses are usually larger than corns.  Corns and calluses are caused by rubbing (friction) or pressure, such as from shoes that are too tight or do not fit properly.  Treatment may include wearing any padding, protective layers, gloves, or orthotics as told by your health care provider. This information is not intended to replace advice given to you by your health care provider. Make sure you discuss any questions you have with your health care provider. Document Released: 03/11/2004 Document Revised: 04/18/2017 Document Reviewed: 04/18/2017 Elsevier Interactive Patient Education  2019 Elsevier Inc.  

## 2018-08-07 ENCOUNTER — Encounter: Payer: Medicaid Other | Admitting: Internal Medicine

## 2018-08-14 ENCOUNTER — Encounter: Payer: Self-pay | Admitting: Podiatry

## 2018-08-14 NOTE — Progress Notes (Signed)
Subjective: Eric Stephens presents with  cc of painful, discolored, thick toenails and painful calluses which interfere with activities of daily living. Pain is aggravated when wearing enclosed shoe gear. Pain is relieved with periodic professional debridement.  His father is present during the visit.  Lanelle Bal, MD is his PCP.  Last visit May 01, 2018.   Current Outpatient Medications:  .  brimonidine (ALPHAGAN) 0.2 % ophthalmic solution, PLACE 1 DROP INTO RIGHT EYE TWICE DAILY, Disp: , Rfl:  .  cetirizine (ZYRTEC) 10 MG tablet, Take 1 tablet (10 mg total) by mouth daily. (Patient taking differently: Take 10 mg by mouth daily as needed for allergies. ), Disp: 30 tablet, Rfl: 11 .  COMBIGAN 0.2-0.5 % ophthalmic solution, INSTILL 1 DROP INTO BOTH EYES TWICE A DAY, Disp: , Rfl:  .  [employer] SALINE NOSE SPRAY 0.65 % nasal spray, PLACE 1 SPRAY INTO BOTH NOSTRILS AS NEEDED FOR CONGESTION., Disp: 30 mL, Rfl: 0 .  cyclobenzaprine (FLEXERIL) 10 MG tablet, Take 1 tablet (10 mg total) by mouth 2 (two) times daily as needed for muscle spasms., Disp: 20 tablet, Rfl: 0 .  gabapentin (NEURONTIN) 800 MG tablet, TAKE 1 TABLET TWICE A DAY FOR ANXIETY AND IRRITABLITY, Disp: , Rfl:  .  PROVENTIL HFA 108 (90 Base) MCG/ACT inhaler, TAKE 2 PUFFS BY MOUTH EVERY 6 HOURS AS NEEDED FOR WHEEZE OR SHORTNESS OF BREATH, Disp: 6.7 Inhaler, Rfl: 1 .  zolpidem (AMBIEN) 10 MG tablet, Take 10 mg by mouth at bedtime as needed., Disp: , Rfl:   No Known Allergies  Vascular Examination: Capillary refill time <3 seconds x 10 digits  Dorsalis pedis and Posterior tibial pulses present b/l  No digital hair x 10 digits  Skin temperature within normal limits bilaterally  Dermatological Examination: Skin with normal turgor, texture and tone b/l  Toenails 1-5 b/l discolored, thick, dystrophic with subungual debris and pain with palpation to nailbeds due to thickness of nails.  Hyperkeratotic lesions noted submetatarsal  head 2 bilaterally.    He has postinflammatory hyperpigmentation noted on the dorsal aspect of the left second digit PIPJ. Apparently this is from a pair of leather shoes rubbing his toe per his father.  There is no erythema, no edema, no drainage, no flocculence noted.  Musculoskeletal: Muscle strength 5/5 to all LE muscle groups  Neurological: Sensation intact with 10 gram monofilament. Vibratory sensation diminished  Assessment: 1. Painful onychomycosis toenails 1-5 b/l 2. Callus submetatarsal head 2 bilaterally 3. 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 Toenails 1-5 b/l were debrided in length and girth without iatrogenic bleeding. 2. Calluses pared submetatarsal head 2 bilaterally with blade without incident. 3. Patient to continue soft, supportive shoe gear.  Patient advised to discontinue wearing leather shoes which cause irritation to his digits.  Patient related understanding.  Father will try to reinforce these instructions with him. 4. Patient to report any pedal injuries to medical professional  5. Follow up 3 months. Patient/POA to call should there be a concern in the interim.

## 2018-08-20 NOTE — Progress Notes (Signed)
[facility] Note  08/23/2018     CHIEF COMPLAINT Patient presents for Retina Follow Up   HISTORY OF PRESENT ILLNESS: Eric Stephens is a 38 y.o. male who presents to the clinic today for:   HPI    Retina Follow Up    Patient presents with  Other.  In both eyes.  Since onset it is stable.  I, the attending physician,  performed the HPI with the patient and updated documentation appropriately.          Comments    38 y/o male here for 3 month follow up of both eyes (lattice with atrophic holes OU).  Patient denies any change in vision, denies eye pain, and denies any floaters or flashes of light in either eye.       Last edited by Bernarda Caffey, MD on 08/24/2018  1:33 AM. (History)     pt states eyes are doing well  Referring physician: Kathi Ludwig, MD [place], [place] [address]  HISTORICAL INFORMATION:   Selected notes from the MEDICAL RECORD NUMBER Referred by Dr. Maryjane Hurter for retinal evaluation LEE: 07.25.19 (M. Le) [BCVA: OD: 20/60- OS: 20/60-] Ocular Hx-cataracts, HTN ret PMH-syphilis, schizophrenia, former smoker    CURRENT MEDICATIONS: Current Outpatient Medications (Ophthalmic Drugs)  Medication Sig  . brimonidine (ALPHAGAN) 0.2 % ophthalmic solution PLACE 1 DROP INTO RIGHT EYE TWICE DAILY  . COMBIGAN 0.2-0.5 % ophthalmic solution INSTILL 1 DROP INTO BOTH EYES TWICE A DAY   No current facility-administered medications for this visit.  (Ophthalmic Drugs)   Current Outpatient Medications (Other)  Medication Sig  . cetirizine (ZYRTEC) 10 MG tablet Take 1 tablet (10 mg total) by mouth daily. (Patient taking differently: Take 10 mg by mouth daily as needed for allergies. )  . [employer] SALINE NOSE SPRAY 0.65 % nasal spray PLACE 1 SPRAY INTO BOTH NOSTRILS AS NEEDED FOR CONGESTION.  . cyclobenzaprine (FLEXERIL) 10 MG tablet Take 1 tablet (10 mg total) by mouth 2 (two) times daily as needed for muscle spasms.  [name] gabapentin (NEURONTIN)  800 MG tablet TAKE 1 TABLET TWICE A DAY FOR ANXIETY AND IRRITABLITY  . PROVENTIL HFA 108 (90 Base) MCG/ACT inhaler TAKE 2 PUFFS BY MOUTH EVERY 6 HOURS AS NEEDED FOR WHEEZE OR SHORTNESS OF BREATH  . zolpidem (AMBIEN) 10 MG tablet Take 10 mg by mouth at bedtime as needed.   No current facility-administered medications for this visit.  (Other)      REVIEW OF SYSTEMS: ROS    Negative for: Constitutional, Gastrointestinal, Neurological, Skin, Genitourinary, Musculoskeletal, HENT, Endocrine, Cardiovascular, Eyes, Respiratory, Psychiatric, Allergic/Imm, Heme/Lymph   Last edited by Doneen Poisson on 08/23/2018  9:18 AM. (History)       ALLERGIES No Known Allergies  PAST MEDICAL HISTORY Past Medical History:  Diagnosis Date  . Asthma   . Syphilis    Past Surgical History:  Procedure Laterality Date  . WRIST SURGERY      FAMILY HISTORY Family History  Problem Relation Age of Onset  . Hypertension Father   . Hypertension Mother   . Arthritis Mother     SOCIAL HISTORY Social History   Tobacco Use  . Smoking status: Current Some Day Smoker    Packs/day: 0.50    Types: Cigarettes  . Smokeless tobacco: Never Used  Substance Use Topics  . Alcohol use: No  . Drug use: No         OPHTHALMIC EXAM:  Base  Eye Exam    Visual Acuity (Snellen - Linear)      Right Left   Dist cc 20/30 -2 20/30 -1   Dist ph cc NI NI   Correction:  Glasses       Tonometry (Tonopen, 9:23 AM)      Right Left   Pressure 13 10       Pupils      Dark Light Shape React   Right 4 3 Round 1+   Left 4 3 Round 1+       Extraocular Movement      Right Left    Full Full       Neuro/Psych    Oriented x3:  Yes   Mood/Affect:  Normal       Dilation    Both eyes:  1.0% Mydriacyl, 2.5% Phenylephrine @ 9:23 AM        Slit Lamp and Fundus Exam    Slit Lamp Exam      Right Left   Lids/Lashes Meibomian gland dysfunction Meibomian gland dysfunction   Conjunctiva/Sclera Melanosis  Melanosis   Cornea Arcus, 1+ Punctate epithelial erosions 1-2+ Punctate epithelial erosions   Anterior Chamber Deep and clear Deep and quiet   Iris Round and dilated Round and dilated   Lens 1+ Nuclear sclerosis, 2+ Cortical cataract 1+ Nuclear sclerosis, 2+ Cortical cataract   Vitreous Vitreous syneresis, prominent vitreous base temporally Vitreous syneresis, prominent vitreous base        Fundus Exam      Right Left   Disc trace Pallor, Temporal Peripapillary atrophy trace Pallor, 360 Peripapillary atrophy   C/D Ratio 0.7 0.6   Macula Good foveal reflex, RPE mottling and clumping Good foveal reflex, RPE mottling and clumping   Vessels Vascular attenuation, Tortuous Vascular attenuation, Tortuous   Periphery Attached, inferotemporal pigment changes, lattice degeneration from [id number] with focal area of pigmentation at 0430, small operculated hole at 1030 -- good laser changes surrounding, prominent vitreous base, White without pressure IT quadrant Attached, Operculum at 0100 and 0230 with good laser changes surrounding, small areas of pigmented lattice at 0500 -- good laser changes, prominent vitreous base, scattered White without pressure          IMAGING AND PROCEDURES  Imaging and Procedures for '@TODAY'$ @  OCT, Retina - OU - Both Eyes       Right Eye Quality was good. Central Foveal Thickness: 265. Progression has been stable. Findings include normal foveal contour, no IRF, no SRF, myopic contour.   Left Eye Quality was good. Central Foveal Thickness: 262. Progression has been stable. Findings include no IRF, no SRF, normal foveal contour, myopic contour.   Notes *Images captured and stored on drive  Diagnosis / Impression:  Myopic contour; NFP; No IRF/SRF  Clinical management:  See below  Abbreviations: NFP - Normal foveal profile. CME - cystoid macular edema. PED - pigment epithelial detachment. IRF - intraretinal fluid. SRF - subretinal fluid. EZ - ellipsoid zone. ERM -  epiretinal membrane. ORA - outer retinal atrophy. ORT - outer retinal tubulation. 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 Retinal edema H35.81 OCT, Retina - OU - Both Eyes  5. Nuclear sclerosis of both eyes H25.13  6. Hx of syphilis Z86.19   7. Primary open angle glaucoma (POAG) of both eyes, moderate stage H40.1132     1,2. Lattice degeneration w/ atrophic holes, OU - OD: pigmented lattice 0430-0600, small operculated hole at 1030 - OS: opercula at 0100 and 0230, lattice at 0500 - S/P laser retinopexy OD (08.21.19) -- good laser surrounding - S/P laser retinopexy OS (09.04.19), touch up laser (10.21.19) -- good laser surrounding - f/u 1 year  3. High myopia OU - under the expert refractive management of Dr. Marin Comment - OCT shows diffuse thinning of retina - discussed findings and prognosis associated with high myopia / degenerative myopia  4. No retinal edema on exam or OCT  5. Nuclear sclerosis OU-  - The symptoms of cataract, surgical options, and treatments and risks were discussed with patient. - discussed diagnosis and progression - not yet visually significant - monitor for now  6. History of Neurosyphilis - quant RPR 1:16 on 10/2017 from 1:32 in 2017 - FA on 8.16.19 - normal study without leakage, hyperfluorescence or vasculitis - no ocular involvement; no inflammation in anterior or posterior segments   7. POAG OU - consulted with Dr. Midge Aver on July 05, 2018 - changed brimonidine to comigan BID - IOP today 13 OD, 10 OS - will follow up with Dr. Katy Fitch for repeat VF   Ophthalmic Meds Ordered this visit:  No orders of the defined types were placed in this encounter.      Return in about 1 year (around 08/23/2019) for f/u Lattice degeneration w/ atrophic holes,  OU, DFE, OCT.  There are no Patient Instructions on file for this visit.   Explained the diagnoses, plan, and follow up with the patient and they expressed understanding.  Patient expressed understanding of the importance of proper follow up care.   This document serves as a record of services personally performed by Gardiner Sleeper, MD, PhD. It was created on their behalf by Ernest Mallick, OA, an ophthalmic assistant. The creation of this record is the provider's dictation and/or activities during the visit.    Electronically signed by: Ernest Mallick, OA  03.03.2020 1:37 AM      Gardiner Sleeper, M.D., Ph.D. Diseases & Surgery of the Retina and Vitreous [facility]  I have reviewed the above documentation for accuracy and completeness, and I agree with the above. Gardiner Sleeper, M.D., Ph.D. 08/24/18 1:37 AM     Abbreviations: M myopia (nearsighted); A astigmatism; H hyperopia (farsighted); P presbyopia; Mrx spectacle prescription;  CTL contact lenses; OD right eye; OS left eye; OU both eyes  XT exotropia; ET esotropia; PEK punctate epithelial keratitis; PEE punctate epithelial erosions; DES dry eye syndrome; MGD meibomian gland dysfunction; ATs artificial tears; PFAT's preservative free artificial tears; [place] nuclear sclerotic cataract; PSC posterior subcapsular cataract; ERM epi-retinal membrane; PVD posterior vitreous detachment; RD retinal detachment; DM diabetes mellitus; DR diabetic retinopathy; NPDR non-proliferative diabetic retinopathy; PDR proliferative diabetic retinopathy; CSME clinically significant macular edema; DME diabetic macular edema; dbh dot blot hemorrhages; CWS cotton wool spot; POAG primary open angle glaucoma; C/D cup-to-disc ratio; HVF humphrey visual field; GVF goldmann visual field; OCT optical coherence tomography; IOP intraocular pressure; BRVO Branch retinal vein occlusion; CRVO central retinal vein occlusion; CRAO central retinal artery  occlusion; BRAO branch retinal artery occlusion; RT retinal tear; SB scleral buckle; PPV pars plana vitrectomy; VH Vitreous hemorrhage; PRP panretinal laser photocoagulation; IVK intravitreal kenalog; VMT vitreomacular traction; MH Macular hole;  NVD  neovascularization of the disc; NVE neovascularization elsewhere; AREDS age related eye disease study; ARMD age related macular degeneration; POAG primary open angle glaucoma; EBMD epithelial/anterior basement membrane dystrophy; ACIOL anterior chamber intraocular lens; IOL intraocular lens; PCIOL posterior chamber intraocular lens; Phaco/IOL phacoemulsification with intraocular lens placement; [place] photorefractive keratectomy; LASIK laser assisted in situ keratomileusis; HTN hypertension; DM diabetes mellitus; COPD chronic obstructive pulmonary disease

## 2018-08-23 ENCOUNTER — Ambulatory Visit (INDEPENDENT_AMBULATORY_CARE_PROVIDER_SITE_OTHER): Payer: Medicaid Other | Admitting: Ophthalmology

## 2018-08-23 DIAGNOSIS — H2513 Age-related nuclear cataract, bilateral: Secondary | ICD-10-CM

## 2018-08-23 DIAGNOSIS — Z8619 Personal history of other infectious and parasitic diseases: Secondary | ICD-10-CM

## 2018-08-23 DIAGNOSIS — H5213 Myopia, bilateral: Secondary | ICD-10-CM | POA: Diagnosis not present

## 2018-08-23 DIAGNOSIS — H35413 Lattice degeneration of retina, bilateral: Secondary | ICD-10-CM | POA: Diagnosis not present

## 2018-08-23 DIAGNOSIS — H401132 Primary open-angle glaucoma, bilateral, moderate stage: Secondary | ICD-10-CM

## 2018-08-23 DIAGNOSIS — H3589 Other specified retinal disorders: Secondary | ICD-10-CM | POA: Diagnosis not present

## 2018-08-23 DIAGNOSIS — H3581 Retinal edema: Secondary | ICD-10-CM

## 2018-08-24 ENCOUNTER — Encounter (INDEPENDENT_AMBULATORY_CARE_PROVIDER_SITE_OTHER): Payer: Self-pay | Admitting: Ophthalmology

## 2018-09-02 ENCOUNTER — Emergency Department (HOSPITAL_COMMUNITY)
Admission: EM | Admit: 2018-09-02 | Discharge: 2018-09-02 | Disposition: A | Discharge status: Home / Self Care | Payer: Medicaid Other | Attending: Emergency Medicine | Admitting: Emergency Medicine

## 2018-09-02 DIAGNOSIS — S01511A Laceration without foreign body of lip, initial encounter: Secondary | ICD-10-CM | POA: Diagnosis not present

## 2018-09-02 DIAGNOSIS — Y9389 Activity, other specified: Secondary | ICD-10-CM | POA: Diagnosis not present

## 2018-09-02 DIAGNOSIS — J45909 Unspecified asthma, uncomplicated: Secondary | ICD-10-CM | POA: Diagnosis not present

## 2018-09-02 DIAGNOSIS — Z79899 Other long term (current) drug therapy: Secondary | ICD-10-CM | POA: Diagnosis not present

## 2018-09-02 DIAGNOSIS — F17210 Nicotine dependence, cigarettes, uncomplicated: Secondary | ICD-10-CM | POA: Diagnosis not present

## 2018-09-02 DIAGNOSIS — S0083XA Contusion of other part of head, initial encounter: Secondary | ICD-10-CM

## 2018-09-02 DIAGNOSIS — Y92521 Bus station as the place of occurrence of the external cause: Secondary | ICD-10-CM | POA: Diagnosis not present

## 2018-09-02 DIAGNOSIS — F0391 Unspecified dementia with behavioral disturbance: Secondary | ICD-10-CM | POA: Diagnosis not present

## 2018-09-02 DIAGNOSIS — Y999 Unspecified external cause status: Secondary | ICD-10-CM | POA: Diagnosis not present

## 2018-09-02 MED ORDER — LIDOCAINE HCL (PF) 1 % IJ SOLN
2.0000 mL | Freq: Once | INTRAMUSCULAR | Status: AC
  Administered 2018-09-02: 2 mL
  Filled 2018-09-02: qty 30
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
## 2018-09-02 NOTE — ED Notes (Signed)
RN asked patient if she could call his legal guardian. Patient stated to call his father and let him know he was here.  Patient's father notified by RN. Patient's legal guardian reports he is on his way.  Pt has a stutter that is reported to be baseline
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
## 2018-09-02 NOTE — Discharge Instructions (Signed)
Thank you for allowing me to care for you today. Please return to the emergency department if you have new or worsening symptoms. Take your medications as instructed.  ° °

## 2018-09-02 NOTE — ED Notes (Signed)
Suture cart and laceration tray at bedside and ready.

## 2018-09-03 ENCOUNTER — Other Ambulatory Visit: Payer: Self-pay | Admitting: *Deleted

## 2018-09-05 MED ORDER — CETIRIZINE HCL 10 MG PO TABS: 10.0000 mg | ORAL_TABLET | Freq: Every day | ORAL | 2 refills | Status: DC | PRN

## 2018-11-05 ENCOUNTER — Ambulatory Visit: Payer: Medicaid Other | Admitting: Podiatry

## 2018-11-05 ENCOUNTER — Other Ambulatory Visit: Payer: Self-pay

## 2018-11-05 VITALS — Temp 97.0°F

## 2018-11-05 DIAGNOSIS — M79675 Pain in left toe(s): Secondary | ICD-10-CM | POA: Diagnosis not present

## 2018-11-05 DIAGNOSIS — M79674 Pain in right toe(s): Secondary | ICD-10-CM | POA: Diagnosis not present

## 2018-11-05 DIAGNOSIS — L84 Corns and callosities: Secondary | ICD-10-CM

## 2018-11-05 DIAGNOSIS — G63 Polyneuropathy in diseases classified elsewhere: Secondary | ICD-10-CM

## 2018-11-05 DIAGNOSIS — B351 Tinea unguium: Secondary | ICD-10-CM

## 2018-11-05 NOTE — Patient Instructions (Signed)

## 2018-11-12 ENCOUNTER — Encounter: Payer: Self-pay | Admitting: Podiatry

## 2018-11-12 NOTE — Progress Notes (Signed)
Subjective: Eric Stephens presents today for preventative foot care. He has chronic, painful mycotic toenails and calluses which interfere with daily activities and routine tasks.  Pain is aggravated with weightbearing and/or wearing enclosed shoe gear. Pain is getting progressively worse and relieved with periodic professional debridement.   Lanelle Bal, MD is his PCP.    Current Outpatient Medications:  .  brimonidine (ALPHAGAN) 0.2 % ophthalmic solution, PLACE 1 DROP INTO RIGHT EYE TWICE DAILY, Disp: , Rfl:  .  cetirizine (ZYRTEC) 10 MG tablet, Take 1 tablet (10 mg total) by mouth daily as needed for allergies., Disp: 30 tablet, Rfl: 2 .  COMBIGAN 0.2-0.5 % ophthalmic solution, INSTILL 1 DROP INTO BOTH EYES TWICE A DAY, Disp: , Rfl:  .  [employer] SALINE NOSE SPRAY 0.65 % nasal spray, PLACE 1 SPRAY INTO BOTH NOSTRILS AS NEEDED FOR CONGESTION., Disp: 30 mL, Rfl: 0 .  cyclobenzaprine (FLEXERIL) 10 MG tablet, Take 1 tablet (10 mg total) by mouth 2 (two) times daily as needed for muscle spasms., Disp: 20 tablet, Rfl: 0 .  gabapentin (NEURONTIN) 800 MG tablet, TAKE 1 TABLET TWICE A DAY FOR ANXIETY AND IRRITABLITY, Disp: , Rfl:  .  PROVENTIL HFA 108 (90 Base) MCG/ACT inhaler, TAKE 2 PUFFS BY MOUTH EVERY 6 HOURS AS NEEDED FOR WHEEZE OR SHORTNESS OF BREATH, Disp: 6.7 Inhaler, Rfl: 1 .  zolpidem (AMBIEN) 10 MG tablet, Take 10 mg by mouth at bedtime as needed., Disp: , Rfl:   No Known Allergies  Objective: Vascular Examination: Capillary refill time <3 seconds  x 10 digits.  Dorsalis pedis pulses and Posterior tibial pulses present  b/l.  No digital hair x 10 digits.  Skin temperature WNL b/l.  Dermatological Examination: Skin with normal turgor, texture and tone b/l.  Toenails 1-5 b/l discolored, thick, dystrophic with subungual debris and pain with palpation to nailbeds due to thickness of nails.  Hyperkeratotic lesion(s) submet head 2 b/l. No erythema, no edema, no drainage, no  flocculence noted.   Musculoskeletal: Muscle strength 5/5 to all LE muscle groups  Neurological: Sensation intact with 10 gram monofilament.  Vibratory sensation diminished.  Assessment: 1. Painful onychomycosis toenails 1-5 b/l 2. Calluses submet head 2 b/l 3. 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 Toenails 1-5 b/l were debrided in length and girth without iatrogenic bleeding. 2. Calluses pared submetatarsal head(s) 2 b/l utilizing sterile scalpel blade without incident.  3. Patient to continue soft, supportive shoe gear daily. 4. Patient to report any pedal injuries to medical professional immediately. 5. Follow up 3 months.  6. Patient/POA to call should there be a concern in the interim.

## 2018-11-19 ENCOUNTER — Other Ambulatory Visit: Payer: Self-pay | Admitting: Internal Medicine

## 2018-11-27 ENCOUNTER — Other Ambulatory Visit: Payer: Self-pay | Admitting: Internal Medicine
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
## 2019-01-08 ENCOUNTER — Ambulatory Visit: Payer: Medicaid Other | Admitting: Internal Medicine

## 2019-01-08 ENCOUNTER — Encounter: Payer: Self-pay | Admitting: Internal Medicine

## 2019-01-08 ENCOUNTER — Other Ambulatory Visit: Payer: Self-pay

## 2019-01-08 VITALS — BP 119/68 | HR 54 | Temp 98.6°F | Wt 241.8 lb

## 2019-01-08 DIAGNOSIS — A523 Neurosyphilis, unspecified: Secondary | ICD-10-CM | POA: Diagnosis present

## 2019-01-08 DIAGNOSIS — R4189 Other symptoms and signs involving cognitive functions and awareness: Secondary | ICD-10-CM

## 2019-01-08 DIAGNOSIS — R21 Rash and other nonspecific skin eruption: Secondary | ICD-10-CM | POA: Diagnosis not present

## 2019-01-08 DIAGNOSIS — Z13220 Encounter for screening for lipoid disorders: Secondary | ICD-10-CM | POA: Diagnosis not present

## 2019-01-08 MED ORDER — BETAMETHASONE VALERATE 0.1 % EX OINT: 1.0000 "application " | TOPICAL_OINTMENT | Freq: Two times a day (BID) | CUTANEOUS | 0 refills | Status: DC

## 2019-01-08 NOTE — Progress Notes (Signed)
   CC: routine for neurocognitive deficits and new onset rash  HPI:Mr.AZRAEL MADDIX is a 38 y.o. male who presents for evaluation of his routine health care and concerns of a rash. Please see individual problem based A/P for details.   Past Medical History:  Diagnosis Date  . Asthma   . Syphilis    Review of Systems:  ROS negative except as per HPI.  Physical Exam: Vitals:   01/08/19 1320  BP: 119/68  Pulse: (!) 54  Temp: 98.6 F (37 C)  TempSrc: Oral  SpO2: 100%  Weight: 241 lb 12.8 oz (109.7 kg)   General: In no acute distress, afebrile, nondiaphoretic HEENT: PEERL, EMO intact Cardio: RRR, no mrg's  Pulmonary: CTA bilaterally,  no wheezing or crackles  Abdomen: Bowel sounds normal, soft, nontender  MSK: BLE nontender, nonedematous Neuro: Alert, normal gait Psych: Flat affect, not depressed in appearance, attempts to engage  Assessment & Plan:   See Encounters Tab for problem based charting.  Patient discussed with Dr. Dareen Piano

## 2019-01-08 NOTE — Patient Instructions (Signed)
FOLLOW-UP INSTRUCTIONS When: 6 months For: Routine visit What to bring: All of your medications  I have not made any changes to your medications today. I have added a steroid ointment, please apply a small dime nickel sized amount to the affected area until the tube is empty or symptoms resolve.   Today we discussed you back rash. I will notify you of the results of any labs from today's evaluation when available to me.    Thank you for your visit to the [facility] today. If you have any questions or concerns please call us at [phone].

## 2019-01-09 LAB — LIPID PANEL
Chol/HDL Ratio: 4.6 ratio (ref 0.0–5.0)
Cholesterol, Total: 214 mg/dL — ABNORMAL HIGH (ref 100–199)
HDL: 47 mg/dL (ref >39)
LDL Calculated: 151 mg/dL — ABNORMAL HIGH (ref 0–99)
Triglycerides: 82 mg/dL (ref 0–149)
VLDL Cholesterol Cal: 16 mg/dL (ref 5–40)

## 2019-01-09 LAB — CMP14 + ANION GAP
ALT: 13 IU/L (ref 0–44)
AST: 17 IU/L (ref 0–40)
Albumin/Globulin Ratio: 1.7 (ref 1.2–2.2)
Albumin: 4 g/dL (ref 4.0–5.0)
Alkaline Phosphatase: 69 IU/L (ref 39–117)
Anion Gap: 12 mmol/L (ref 10.0–18.0)
BUN/Creatinine Ratio: 15 (ref 9–20)
BUN: 11 mg/dL (ref 6–20)
Bilirubin Total: 0.2 mg/dL (ref 0.0–1.2)
CO2: 21 mmol/L (ref 20–29)
Calcium: 9.3 mg/dL (ref 8.7–10.2)
Chloride: 106 mmol/L (ref 96–106)
Creatinine, Ser: 0.73 mg/dL — ABNORMAL LOW (ref 0.76–1.27)
GFR calc Af Amer: 135 mL/min/{1.73_m2} (ref >59)
GFR calc non Af Amer: 117 mL/min/{1.73_m2} (ref >59)
Globulin, Total: 2.4 g/dL (ref 1.5–4.5)
Glucose: 93 mg/dL (ref 65–99)
Potassium: 4.4 mmol/L (ref 3.5–5.2)
Sodium: 139 mmol/L (ref 134–144)
Total Protein: 6.4 g/dL (ref 6.0–8.5)

## 2019-01-10 ENCOUNTER — Encounter: Payer: Self-pay | Admitting: Internal Medicine

## 2019-01-10 DIAGNOSIS — R21 Rash and other nonspecific skin eruption: Secondary | ICD-10-CM | POA: Insufficient documentation

## 2019-01-10 DIAGNOSIS — Z13220 Encounter for screening for lipoid disorders: Secondary | ICD-10-CM | POA: Insufficient documentation

## 2019-01-10 NOTE — Assessment & Plan Note (Signed)
Lipid Panel     Component Value Date/Time   CHOL 214 (H) 01/08/2019 1338   TRIG 82 01/08/2019 1338   HDL 47 01/08/2019 1338   CHOLHDL 4.6 01/08/2019 1338   LDLCALC 151 (H) 01/08/2019 1338   Total and LDL elevated, given his young age we will recommend dietary changes with increased exercise.

## 2019-01-10 NOTE — Assessment & Plan Note (Signed)
  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 I have not been able to gather enough evidence through chart review alone to definitively support this claming nor can I say it is definitively not related. He has a complex history of psychiatric disorders which have been treated over the past several years with multiple sequela developing. At this stage I am uncertain I have the expertise to better delineate the process at hand nor do I feel I can treat this independently.    Plan: I have strongly advised the patient and his father to seek psychiatric care to best manage his cognitive deficits

## 2019-01-10 NOTE — Assessment & Plan Note (Signed)
  Rash: New onset as of two days prior to the exam, mildly pruritic, located on the left upper buttock and lower back in a 5-8" diameter diffuse pattern, no prior vesicles or pustules per patient, it is not spreading that he is aware, no known triggering contact, no surrounding erythema, no preceding pain syndrome, not worse with anything in particular, has not applied any lotions or creams, not relieved by anything he is aware of. He denied awareness of a similar rash.   This appears to cross multiple dermatomes and does not have an appearance of herpes zoster as there are no vesicles and the base of the papules is identical to the papules. There are no vesicles present, the rash is almost hyperkeratotic in nature but only mildly so. Low threshold to treat this as for herpes zoster if this progresses but currently I feel this is most likely a topical reaction based on the present evidence.    Plan:  I have prescribed a steroid cream and given the patient an his father strict return precautions.

## 2019-01-13 NOTE — Progress Notes (Signed)
Internal Medicine Clinic Attending  Case discussed with Dr. Harbrecht at the time of the visit.  We reviewed the resident's history and exam and pertinent patient test results.  I agree with the assessment, diagnosis, and plan of care documented in the resident's note.   

## 2019-01-14 ENCOUNTER — Other Ambulatory Visit: Payer: Self-pay | Admitting: Internal Medicine

## 2019-01-14 DIAGNOSIS — R21 Rash and other nonspecific skin eruption: Secondary | ICD-10-CM

## 2019-01-14 NOTE — Telephone Encounter (Signed)
Please have patient contact the clinic if the rash does not improve following completion of this script.

## 2019-02-10 ENCOUNTER — Ambulatory Visit: Payer: Medicaid Other | Admitting: Podiatry

## 2019-02-10 ENCOUNTER — Encounter: Payer: Self-pay | Admitting: Podiatry

## 2019-02-10 ENCOUNTER — Other Ambulatory Visit: Payer: Self-pay

## 2019-02-10 VITALS — Temp 98.0°F

## 2019-02-10 DIAGNOSIS — M79674 Pain in right toe(s): Secondary | ICD-10-CM

## 2019-02-10 DIAGNOSIS — B351 Tinea unguium: Secondary | ICD-10-CM | POA: Diagnosis not present

## 2019-02-10 DIAGNOSIS — M79675 Pain in left toe(s): Secondary | ICD-10-CM

## 2019-02-10 NOTE — Patient Instructions (Signed)

## 2019-02-15 ENCOUNTER — Other Ambulatory Visit: Payer: Self-pay | Admitting: Internal Medicine

## 2019-02-17 NOTE — Progress Notes (Signed)
Subjective: Eric Stephens presents today with painful mycotic toenails b/l which interfere with daily activities.  Pain is aggravated when wearing enclosed shoe gear.  Kathi Ludwig, MD is his PCP.    Current Outpatient Medications:  .  betamethasone valerate ointment (VALISONE) 0.1 %, APPLY 1 APPLICATION TOPICALLY 2 (TWO) TIMES DAILY. TO THE RASH ON THE BACK., Disp: 15 g, Rfl: 0 .  brimonidine (ALPHAGAN) 0.2 % ophthalmic solution, PLACE 1 DROP INTO RIGHT EYE TWICE DAILY, Disp: , Rfl:  .  cetirizine (ZYRTEC) 10 MG tablet, TAKE 1 TABLET BY MOUTH EVERY DAY AS NEEDED FOR ALLERGY, Disp: 30 tablet, Rfl: 2 .  COMBIGAN 0.2-0.5 % ophthalmic solution, INSTILL 1 DROP INTO BOTH EYES TWICE A DAY, Disp: , Rfl:  .  [employer] SALINE NOSE SPRAY 0.65 % nasal spray, PLACE 1 SPRAY INTO BOTH NOSTRILS AS NEEDED FOR CONGESTION., Disp: 30 mL, Rfl: 0 .  cyclobenzaprine (FLEXERIL) 10 MG tablet, Take 1 tablet (10 mg total) by mouth 2 (two) times daily as needed for muscle spasms., Disp: 20 tablet, Rfl: 0 .  gabapentin (NEURONTIN) 800 MG tablet, TAKE 1 TABLET TWICE A DAY FOR ANXIETY AND IRRITABLITY, Disp: , Rfl:  .  PROVENTIL HFA 108 (90 Base) MCG/ACT inhaler, TAKE 2 PUFFS BY MOUTH EVERY 6 HOURS AS NEEDED FOR WHEEZE OR SHORTNESS OF BREATH, Disp: 6.7 Inhaler, Rfl: 1 .  zolpidem (AMBIEN) 10 MG tablet, Take 10 mg by mouth at bedtime as needed., Disp: , Rfl:   No Known Allergies  Objective: Vitals:   02/10/19 1021  Temp: 98 F (36.7 C)    Vascular Examination: Capillary refill time <3 seconds x 10 digits.   Dorsalis pedis and Posterior tibial pulses palpable b/l.  Digital hair present x 10 digits.  Skin temperature gradient WNL b/l.  Dermatological Examination: Skin with normal turgor, texture and tone b/l.  Toenails 1-5 b/l discolored, thick, dystrophic with subungual debris and pain with palpation to nailbeds due to thickness of nails.  Webspaces clear b/l.   Musculoskeletal: Muscle strength 5/5 to all  LE muscle groups  No gross bony deformities b/l.  No pain, crepitus or joint limitation noted with ROM.   Neurological: Sensation intact with 10 gram monofilament.  Assessment: Painful onychomycosis toenails 1-5 b/l   Plan: 1. Toenails 1-5 b/l were debrided in length and girth without iatrogenic bleeding. 2. Patient to continue soft, supportive shoe gear daily. 3. Patient to report any pedal injuries to medical professional immediately. 4. Follow up 3 months.  5. Patient/POA to call should there be a concern in the interim.

## 2019-04-08 ENCOUNTER — Ambulatory Visit: Payer: Medicaid Other | Admitting: Internal Medicine

## 2019-04-08 ENCOUNTER — Other Ambulatory Visit: Payer: Self-pay

## 2019-04-08 ENCOUNTER — Encounter: Payer: Self-pay | Admitting: Internal Medicine

## 2019-04-08 VITALS — BP 110/72 | HR 58 | Temp 98.5°F | Wt 238.9 lb

## 2019-04-08 DIAGNOSIS — Z23 Encounter for immunization: Secondary | ICD-10-CM

## 2019-04-08 DIAGNOSIS — H9201 Otalgia, right ear: Secondary | ICD-10-CM

## 2019-04-08 DIAGNOSIS — H9209 Otalgia, unspecified ear: Secondary | ICD-10-CM | POA: Insufficient documentation

## 2019-04-08 MED ORDER — AMOXICILLIN-POT CLAVULANATE 875-125 MG PO TABS: 1.0000 | ORAL_TABLET | Freq: Two times a day (BID) | ORAL | 0 refills | Status: AC

## 2019-04-08 NOTE — Progress Notes (Signed)
   CC: Ear pain  HPI:  [name] is a 38 y.o. M with PMHx listed below presenting for ear pain. Please see the A&P for the status of the patient's chronic medical problems.  Past Medical History:  Diagnosis Date  . Asthma   . Syphilis    Review of Systems:  Performed and all others negative.  Physical Exam:  There were no vitals filed for this visit. Physical Exam Constitutional:      General: He is not in acute distress.    Appearance: Normal appearance.  HENT:     Right Ear: Hearing and external ear normal. No decreased hearing noted. No drainage or tenderness. There is no impacted cerumen. No foreign body.     Left Ear: Hearing, tympanic membrane and external ear normal. No decreased hearing noted. No drainage or tenderness. There is no impacted cerumen. No foreign body.     Ears:     Comments: Narrow canals bilaterally, R>L, unable to visualize R TM due to this Cardiovascular:     Rate and Rhythm: Normal rate and regular rhythm.     Pulses: Normal pulses.     Heart sounds: Normal heart sounds.  Pulmonary:     Effort: Pulmonary effort is normal. No respiratory distress.     Breath sounds: Normal breath sounds.  Abdominal:     General: Bowel sounds are normal. There is no distension.     Palpations: Abdomen is soft.     Tenderness: There is no abdominal tenderness.  Musculoskeletal:        General: No swelling or deformity.  Skin:    General: Skin is warm and dry.  Neurological:     General: No focal deficit present.     Mental Status: Mental status is at baseline.     Assessment & Plan:   See Encounters Tab for problem based charting.  Patient discussed with Dr. Lynnae January

## 2019-04-08 NOTE — Patient Instructions (Addendum)
Thank you for allowing [place] to care for you  For your ear pain - This is consistent with possible ear infection - Please take Augmentin [id number] twice a day for 7 days - Avoid putting anything into the ear that irritates it while you heal - Let us know if this does not improve in the next week

## 2019-04-08 NOTE — Progress Notes (Signed)
Internal Medicine Clinic Attending  Case discussed with Dr. Melvin  at the time of the visit.  We reviewed the resident's history and exam and pertinent patient test results.  I agree with the assessment, diagnosis, and plan of care documented in the resident's note.  

## 2019-04-08 NOTE — Assessment & Plan Note (Addendum)
Patient present with about 7 days of ear pain. The pain is deep and and associated with fullness. His hearing is intact and he denies fevers or URI symptoms. He states that the pain has been constant and maybe worsening over the past week. I was unable to visualize the TM on the affected side due to canal narrowing. Left TM able to be visualized and was intact with minimal overlying cerumen. Given his symptoms and inability to rule out otitis media, will cover for this with 1 week of antibiotics. - Augmentin BID x7 Days

## 2019-05-09 ENCOUNTER — Encounter: Payer: Self-pay | Admitting: *Deleted

## 2019-05-13 ENCOUNTER — Encounter: Payer: Self-pay | Admitting: Podiatry

## 2019-05-13 ENCOUNTER — Other Ambulatory Visit: Payer: Self-pay

## 2019-05-13 ENCOUNTER — Ambulatory Visit: Payer: Medicaid Other | Admitting: Podiatry

## 2019-05-13 DIAGNOSIS — M79674 Pain in right toe(s): Secondary | ICD-10-CM | POA: Diagnosis not present

## 2019-05-13 DIAGNOSIS — M79675 Pain in left toe(s): Secondary | ICD-10-CM

## 2019-05-13 DIAGNOSIS — B351 Tinea unguium: Secondary | ICD-10-CM | POA: Diagnosis not present

## 2019-05-16 ENCOUNTER — Other Ambulatory Visit (INDEPENDENT_AMBULATORY_CARE_PROVIDER_SITE_OTHER): Payer: Self-pay | Admitting: Ophthalmology

## 2019-05-16 ENCOUNTER — Other Ambulatory Visit: Payer: Self-pay | Admitting: Internal Medicine

## 2019-05-17 NOTE — Progress Notes (Signed)
Subjective: Eric Stephens presents to clinic with cc of painful mycotic toenails and callus which are aggravated when weightbearing with and without shoe gear.  This pain limits his daily activities. Pain symptoms resolve with periodic professional debridement.  Kathi Ludwig, MD is his PCP.   Medications reviewed in chart.  No Known Allergies   Objective: There were no vitals filed for this visit.  Physical Examination:  Vascular  Examination: Capillary refill time <3 seconds b/l.  Palpable DP/PT pulses b/l.  Digital hair present b/l.  No edema noted b/l.  Skin temperature gradient WNL b/l.  Dermatological Examination: Skin with normal turgor, texture and tone b/l.  No open wounds b/l.  No interdigital macerations noted b/l.  Elongated, thick, discolored brittle toenails with subungual debris and pain on dorsal palpation of nailbeds 1-5 b/l.  Hyperkeratotic lesion submet head 2 b/l with tenderness to palpation. No edema, no erythema, no drainage, no flocculence.   Musculoskeletal Examination: Muscle strength 5/5 to all muscle groups b/l.  No pain, crepitus or joint discomfort with active/passive ROM.  Neurological Examination: Sensation intact 5/5 b/l with 10 gram monofilament.  Vibratory sensation intact b/l.  Proprioceptive sensation intact b/l.  Assessment: 1. Mycotic nail infection with pain 1-5 b/l  Plan: 1. Toenails 1-5 b/l were debrided in length and girth without iatrogenic laceration. 2. As courtesy, hyperkeratotic lesions pared submet head 2 b/l utilizing sterile scalpel blade without incident. 3. Continue soft, supportive shoe gear daily. 4. Report any pedal injuries to medical professional. 5. Follow up 3 months. 6. Patient/POA to call should there be a question/concern in there interim.

## 2019-05-28 ENCOUNTER — Ambulatory Visit: Payer: Medicaid Other

## 2019-07-09 ENCOUNTER — Encounter: Payer: Medicaid Other | Admitting: Internal Medicine

## 2019-07-22 NOTE — Progress Notes (Addendum)
   CC: follow-up for HLD and neurocognitive disorder  HPI:Eric Stephens is a 39 y.o. male who presents for evaluation of HLD and neurosyphilis induced neurocognitive disabilty. Please see individual problem based A/P for details.  Past Medical History:  Diagnosis Date  . Asthma   . Syphilis    Review of Systems:  ROS negative except as per HPI.  Physical Exam: Vitals:   07/23/19 1350  BP: 110/72  Pulse: 70  Temp: 98.4 F (36.9 C)  TempSrc: Oral  SpO2: 99%  Weight: 244 lb 3.2 oz (110.8 kg)  Height: 5\' 11"  (1.803 m)   General: A/O x4, in no acute distress, afebrile, nondiaphoretic HEENT: PEERL, EMO intact Cardio: RRR, no mrg's  Pulmonary: CTA bilaterally, no wheezing or crackles  Abdomen: Bowel sounds normal, soft, nontender  MSK: BLE nontender, nonedematous Psych: Appropriate affect, not depressed in appearance, engages well  Assessment & Plan:   See Encounters Tab for problem based charting.  Patient discussed with Dr. 

## 2019-07-23 ENCOUNTER — Other Ambulatory Visit: Payer: Self-pay

## 2019-07-23 ENCOUNTER — Encounter: Payer: Self-pay | Admitting: Internal Medicine

## 2019-07-23 ENCOUNTER — Ambulatory Visit: Payer: Medicaid Other | Admitting: Internal Medicine

## 2019-07-23 DIAGNOSIS — B351 Tinea unguium: Secondary | ICD-10-CM

## 2019-07-23 DIAGNOSIS — E785 Hyperlipidemia, unspecified: Secondary | ICD-10-CM | POA: Diagnosis present

## 2019-07-23 DIAGNOSIS — F0280 Dementia in other diseases classified elsewhere without behavioral disturbance: Secondary | ICD-10-CM

## 2019-07-23 DIAGNOSIS — A5217 General paresis: Secondary | ICD-10-CM

## 2019-07-23 DIAGNOSIS — F02818 Dementia in other diseases classified elsewhere, unspecified severity, with other behavioral disturbance: Secondary | ICD-10-CM

## 2019-07-23 DIAGNOSIS — E7800 Pure hypercholesterolemia, unspecified: Secondary | ICD-10-CM

## 2019-07-23 DIAGNOSIS — Z79899 Other long term (current) drug therapy: Secondary | ICD-10-CM

## 2019-07-23 DIAGNOSIS — F0281 Dementia in other diseases classified elsewhere with behavioral disturbance: Secondary | ICD-10-CM

## 2019-07-23 NOTE — Patient Instructions (Signed)
FOLLOW-UP INSTRUCTIONS When: yearly For: Routine visit What to bring: All of your medications  Please return annually or sooner if you have any issues arise. Please continue to take care of your toenails and to follow with psychiatry.   Thank you for your visit to the [facility] today. If you have any questions or concerns please call us at [phone].

## 2019-07-23 NOTE — Assessment & Plan Note (Signed)
HLD: Although his LDL-C is elevated his ASCVD risk score (assuming age 39) is 2.7% and only reduced to 2.2% with a statin. Given this he has opted to forgo therapy at this time.  Plan: Monitor Q2 years and adjust as indicated

## 2019-07-23 NOTE — Assessment & Plan Note (Signed)
Onychomycosis of toenail: Follows with podiatry. No current issues. He is to see them again in the next few weeks.

## 2019-07-23 NOTE — Assessment & Plan Note (Signed)
Neurocognitive disability, neurosyphilis: Patient was previously advised to follow with psychiatry. He was also previously to begin residing in a facility for adults but this did not work out per the patients father. Per father he is living well with them and follows with psychiatry. All questions were answered today.

## 2019-07-24 NOTE — Progress Notes (Signed)
Internal Medicine Clinic Attending  Case discussed with Dr. Harbrecht at the time of the visit.  We reviewed the resident's history and exam and pertinent patient test results.  I agree with the assessment, diagnosis, and plan of care documented in the resident's note.   

## 2019-08-01 ENCOUNTER — Encounter: Payer: Self-pay | Admitting: Internal Medicine

## 2019-08-02 ENCOUNTER — Other Ambulatory Visit: Payer: Self-pay | Admitting: Internal Medicine

## 2019-08-08 ENCOUNTER — Other Ambulatory Visit: Payer: Self-pay | Admitting: Internal Medicine

## 2019-08-08 DIAGNOSIS — F02818 Dementia in other diseases classified elsewhere, unspecified severity, with other behavioral disturbance: Secondary | ICD-10-CM

## 2019-08-08 DIAGNOSIS — F0281 Dementia in other diseases classified elsewhere with behavioral disturbance: Secondary | ICD-10-CM

## 2019-08-12 ENCOUNTER — Other Ambulatory Visit: Payer: Self-pay

## 2019-08-12 ENCOUNTER — Encounter: Payer: Self-pay | Admitting: Podiatry

## 2019-08-12 ENCOUNTER — Ambulatory Visit: Payer: Medicaid Other | Admitting: Podiatry

## 2019-08-12 DIAGNOSIS — B351 Tinea unguium: Secondary | ICD-10-CM | POA: Diagnosis not present

## 2019-08-12 DIAGNOSIS — M79674 Pain in right toe(s): Secondary | ICD-10-CM | POA: Diagnosis not present

## 2019-08-12 DIAGNOSIS — M79675 Pain in left toe(s): Secondary | ICD-10-CM

## 2019-08-12 NOTE — Patient Instructions (Addendum)
Use pumice stone on calluses every 1-2 weeks to prevent build-up or pain. Do not use over the counter chemical corn/callus removers. This can cause a wound.  If you notice any pain, redness, blistering or drainage, come in immediately.  Corns and Calluses Corns are small areas of thickened skin that occur on the top, sides, or tip of a toe. They contain a cone-shaped core with a point that can press on a nerve below. This causes pain.  Calluses are areas of thickened skin that can occur anywhere on the body, including the hands, fingers, palms, soles of the feet, and heels. Calluses are usually larger than corns. What are the causes? Corns and calluses are caused by rubbing (friction) or pressure, such as from shoes that are too tight or do not fit properly. What increases the risk? Corns are more likely to develop in people who have misshapen toes (toe deformities), such as hammer toes. Calluses can occur with friction to any area of the skin. They are more likely to develop in people who:  Work with their hands.  Wear shoes that fit poorly, are too tight, or are high-heeled.  Have toe deformities. What are the signs or symptoms? Symptoms of a corn or callus include:  A hard growth on the skin.  Pain or tenderness under the skin.  Redness and swelling.  Increased discomfort while wearing tight-fitting shoes, if your feet are affected. If a corn or callus becomes infected, symptoms may include:  Redness and swelling that gets worse.  Pain.  Fluid, blood, or pus draining from the corn or callus. How is this diagnosed? Corns and calluses may be diagnosed based on your symptoms, your medical history, and a physical exam. How is this treated? Treatment for corns and calluses may include:  Removing the cause of the friction or pressure. This may involve: ? Changing your shoes. ? Wearing shoe inserts (orthotics) or other protective layers in your shoes, such as a corn  pad. ? Wearing gloves.  Applying medicine to the skin (topical medicine) to help soften skin in the hardened, thickened areas.  Removing layers of dead skin with a file to reduce the size of the corn or callus.  Removing the corn or callus with a scalpel or laser.  Taking antibiotic medicines, if your corn or callus is infected.  Having surgery, if a toe deformity is the cause. Follow these instructions at home:   Take over-the-counter and prescription medicines only as told by your health care provider.  If you were prescribed an antibiotic, take it as told by your health care provider. Do not stop taking it even if your condition starts to improve.  Wear shoes that fit well. Avoid wearing high-heeled shoes and shoes that are too tight or too loose.  Wear any padding, protective layers, gloves, or orthotics as told by your health care provider.  Soak your hands or feet and then use a file or pumice stone to soften your corn or callus. Do this as told by your health care provider.  Check your corn or callus every day for symptoms of infection. Contact a health care provider if you:  Notice that your symptoms do not improve with treatment.  Have redness or swelling that gets worse.  Notice that your corn or callus becomes painful.  Have fluid, blood, or pus coming from your corn or callus.  Have new symptoms. Summary  Corns are small areas of thickened skin that occur on the top, sides, or  tip of a toe.  Calluses are areas of thickened skin that can occur anywhere on the body, including the hands, fingers, palms, and soles of the feet. Calluses are usually larger than corns.  Corns and calluses are caused by rubbing (friction) or pressure, such as from shoes that are too tight or do not fit properly.  Treatment may include wearing any padding, protective layers, gloves, or orthotics as told by your health care provider. This information is not intended to replace advice  given to you by your health care provider. Make sure you discuss any questions you have with your health care provider. Document Revised: 09/25/2018 Document Reviewed: 04/18/2017 Elsevier Patient Education  [employer]  WHAT IS IT? An infection that lies within the keratin of your nail plate that is caused by a fungus.  WHY ME? Fungal infections affect all ages, sexes, races, and creeds.  There may be many factors that predispose you to a fungal infection such as age, coexisting medical conditions such as diabetes, or an autoimmune disease; stress, medications, fatigue, genetics, etc.  Bottom line: fungus thrives in a warm, moist environment and your shoes offer such a location.  IS IT CONTAGIOUS? Theoretically, yes.  You do not want to share shoes, nail clippers or files with someone who has fungal toenails.  Walking around barefoot in the same room or sleeping in the same bed is unlikely to transfer the organism.  It is important to realize, however, that fungus can spread easily from one nail to the next on the same foot.  HOW DO WE TREAT THIS?  There are several ways to treat this condition.  Treatment may depend on many factors such as age, medications, pregnancy, liver and kidney conditions, etc.  It is best to ask your doctor which options are available to you.  4. No treatment.   Unlike many other medical concerns, you can live with this condition.  However for many people this can be a painful condition and may lead to ingrown toenails or a bacterial infection.  It is recommended that you keep the nails cut short to help reduce the amount of fungal nail. 5. Topical treatment.  These range from herbal remedies to prescription strength nail lacquers.  About 40-50% effective, topicals require twice daily application for approximately 9 to 12 months or until an entirely new nail has grown out.  The most effective topicals are medical grade medications  available through physicians offices. 6. Oral antifungal medications.  With an 80-90% cure rate, the most common oral medication requires 3 to 4 months of therapy and stays in your system for a year as the new nail grows out.  Oral antifungal medications do require blood work to make sure it is a safe drug for you.  A liver function panel will be performed prior to starting the medication and after the first month of treatment.  It is important to have the blood work performed to avoid any harmful side effects.  In general, this medication safe but blood work is required. 7. Laser Therapy.  This treatment is performed by applying a specialized laser to the affected nail plate.  This therapy is noninvasive, fast, and non-painful.  It is not covered by insurance and is therefore, out of pocket.  The results have been very good with a 80-95% cure rate.  [facility] is the only practice in the area to offer this therapy. 8. Permanent Nail Avulsion.  Removing  the entire nail so that a new nail will not grow back.

## 2019-08-14 NOTE — Progress Notes (Signed)
Subjective: Eric Stephens presents today for follow up of callus(es) plantar aspect of both feet b/l and painful mycotic toenails b/l that are difficult to trim. Pain interferes with ambulation. Aggravating factors include wearing enclosed shoe gear. Pain is relieved with periodic professional debridement.   [name] father is present during the visit.  No Known Allergies   Objective: There were no vitals filed for this visit.  Vascular Examination:  Capillary fill time to digits <3s b/l, palpable DP pulses b/l, palpable PT pulses b/l, pedal hair present b/l and skin temperature gradient within normal limits b/l  Dermatological Examination: Pedal skin with normal turgor, texture and tone bilaterally, no open wounds bilaterally, no interdigital macerations bilaterally, toenails 1-5 b/l elongated, dystrophic, thickened, crumbly with subungual debris and hyperkeratotic lesion(s) submet head 2 b/l.  No erythema, no edema, no drainage, no flocculence  Musculoskeletal: Normal muscle strength 5/5 to all lower extremity muscle groups bilaterally, no pain crepitus or joint limitation noted with ROM b/l and hammertoes noted to the  L 2nd toe and R 2nd toe  Neurological: Protective sensation intact 5/5 intact bilaterally with 10g monofilament b/l and vibratory sensation intact b/l  Assessment: 1. Pain due to onychomycosis of toenails of both feet    Plan: -Patient's father refused debridement of calluses on today's visit. Medicaid ABN signed for 2021. Copy given to patient on today's visit and copy placed in patient chart. -Toenails 1-5 b/l were debrided in length and girth with sterile nail nippers and dremel without iatrogenic bleeding. -Patient to continue soft, supportive shoe gear daily. -Patient to report any pedal injuries to medical professional immediately. -Patient/POA to call should there be question/concern in the interim.  Return in about 3 months (around 11/09/2019) for nail  trim.

## 2019-08-19 NOTE — Progress Notes (Signed)
[facility] Note  08/25/2019     CHIEF COMPLAINT Patient presents for Retina Follow Up   HISTORY OF PRESENT ILLNESS: Eric Stephens is a 39 y.o. male who presents to the clinic today for:   HPI    Retina Follow Up    In both eyes.  This started 1 year ago.  Since onset it is stable.  I, the attending physician,  performed the HPI with the patient and updated documentation appropriately.          Comments    1 Year f/u Lattice degen, OU. Patient states his vision is "good" except for itchy eyes, denies ocular pain, flashes and floaters,he is using liquid tears bid.       Last edited by Bernarda Caffey, MD on 08/25/2019 10:03 AM. (History)     pt states his eyes are doing well, he feels like his vision is stable, his dad states he is wearing a new glasses rx, he denies fol and floaters  Referring physician: Kathi Ludwig, MD [place],  [place] [id number]  HISTORICAL INFORMATION:   Selected notes from the MEDICAL RECORD NUMBER Referred by Dr. Maryjane Hurter for retinal evaluation LEE: 07.25.19 (M. Marin Comment) [BCVA: OD: 20/60- OS: 20/60-] Ocular Hx-cataracts, HTN ret PMH-syphilis, schizophrenia, former smoker    CURRENT MEDICATIONS: Current Outpatient Medications (Ophthalmic Drugs)  Medication Sig  . brimonidine (ALPHAGAN) 0.2 % ophthalmic solution PLACE 1 DROP INTO RIGHT EYE TWICE DAILY  . COMBIGAN 0.2-0.5 % ophthalmic solution INSTILL 1 DROP INTO BOTH EYES TWICE A DAY   No current facility-administered medications for this visit. (Ophthalmic Drugs)   Current Outpatient Medications (Other)  Medication Sig  . betamethasone valerate ointment (VALISONE) 0.1 % APPLY 1 APPLICATION TOPICALLY 2 (TWO) TIMES DAILY. TO THE RASH ON THE BACK.  . cetirizine (ZYRTEC) 10 MG tablet TAKE 1 TABLET BY MOUTH EVERY DAY AS NEEDED FOR ALLERGY  . CVS SALINE NOSE SPRAY 0.65 % nasal spray PLACE 1 SPRAY INTO BOTH NOSTRILS AS NEEDED FOR CONGESTION.  . cyclobenzaprine  (FLEXERIL) 10 MG tablet Take 1 tablet (10 mg total) by mouth 2 (two) times daily as needed for muscle spasms.  . diphenhydrAMINE (BENADRYL) 25 mg capsule Inject into the vein.  [name] gabapentin (NEURONTIN) 800 MG tablet TAKE 1 TABLET TWICE A DAY FOR ANXIETY AND IRRITABLITY  . Methylfol-Methylcob-Acetylcyst (METAFOLBIC PLUS) 6-2-600 MG TABS Take by mouth.  . polyethylene glycol powder (GLYCOLAX/MIRALAX) 17 GM/SCOOP powder Take by mouth.  [name] PROVENTIL HFA 108 (90 Base) MCG/ACT inhaler TAKE 2 PUFFS BY MOUTH EVERY 6 HOURS AS NEEDED FOR WHEEZE OR SHORTNESS OF BREATH  . Pyridoxine HCl 200 MG TBCR Take by mouth.  . vitamin E 180 MG (400 UNITS) capsule Take by mouth.  . zolpidem (AMBIEN) 10 MG tablet Take 10 mg by mouth at bedtime as needed.   No current facility-administered medications for this visit. (Other)      REVIEW OF SYSTEMS: ROS    Positive for: Eyes   Negative for: Constitutional, Gastrointestinal, Neurological, Skin, Genitourinary, Musculoskeletal, HENT, Endocrine, Cardiovascular, Respiratory, Psychiatric, Allergic/Imm, Heme/Lymph   Last edited by Zenovia Jordan, LPN on 12/21/9447  6:75 AM. (History)       ALLERGIES No Known Allergies  PAST MEDICAL HISTORY Past Medical History:  Diagnosis Date  . Asthma   . Syphilis    Past Surgical History:  Procedure Laterality Date  . WRIST SURGERY      FAMILY HISTORY Family History  Problem  Relation Age of Onset  . Hypertension Father   . Hypertension Mother   . Arthritis Mother     SOCIAL HISTORY Social History   Tobacco Use  . Smoking status: Current Some Day Smoker    Packs/day: 0.50    Types: Cigarettes  . Smokeless tobacco: Never Used  Substance Use Topics  . Alcohol use: No  . Drug use: No         OPHTHALMIC EXAM:  Base Eye Exam    Visual Acuity (Snellen - Linear)      Right Left   Dist cc 20/60 +2 20/70   Dist ph cc 20/50 -2 NI   Correction: Glasses       Tonometry (Tonopen, 9:38 AM)      Right Left    Pressure 18 16       Pupils      Dark Light Shape React APD   Right 3 2 Round Slow None   Left 3 2 Round Slow None       Visual Fields (Counting fingers)      Left Right    Full Full       Extraocular Movement      Right Left    Full, Ortho Full, Ortho       Neuro/Psych    Oriented x3: Yes   Mood/Affect: Normal       Dilation    Both eyes: 1.0% Mydriacyl, 2.5% Phenylephrine @ 9:34 AM        Slit Lamp and Fundus Exam    Slit Lamp Exam      Right Left   Lids/Lashes Meibomian gland dysfunction, Dermatochalasis - upper lid Mild Meibomian gland dysfunction   Conjunctiva/Sclera Melanosis Melanosis   Cornea Arcus, trace Punctate epithelial erosions 1-2+ Punctate epithelial erosions   Anterior Chamber Deep and clear Deep and quiet   Iris Round and dilated Round and dilated   Lens 1+ Nuclear sclerosis, 1-2+ Cortical cataract 1+ Nuclear sclerosis, 1-2+ Cortical cataract   Vitreous Vitreous syneresis, prominent vitreous base temporally Vitreous syneresis, prominent vitreous base        Fundus Exam      Right Left   Disc trace Pallor, Temporal Peripapillary atrophy, Tilted disc trace Pallor, 360 Peripapillary atrophy, Tilted disc, Sharp rim   C/D Ratio 0.6 0.55   Macula Good foveal reflex, RPE mottling and clumping, No heme or edema Good foveal reflex, RPE mottling and clumping   Vessels Vascular attenuation, Tortuous Vascular attenuation, Tortuous   Periphery Attached, inferotemporal pigment changes, lattice degeneration from [phone] w/ good laser surrounding, small operculated hole at 1030 -- good laser surrounding; prominent vitreous base, White without pressure IT quadrant Attached, Opercula at 0100 and 0230 with good laser changes surrounding, small areas of pigmented lattice from [phone] -- good laser changes, prominent vitreous base, scattered White without pressure          IMAGING AND PROCEDURES  Imaging and Procedures for '@TODAY'$ @  OCT, Retina - OU - Both  Eyes       Right Eye Quality was good. Central Foveal Thickness: 263. Progression has been stable. Findings include normal foveal contour, no IRF, no SRF, myopic contour.   Left Eye Quality was good. Central Foveal Thickness: 268. Progression has been stable. Findings include no IRF, no SRF, normal foveal contour, myopic contour.   Notes *Images captured and stored on drive  Diagnosis / Impression:  Myopic contour; NFP; No IRF/SRF OU  Clinical management:  See below  Abbreviations: NFP -  Normal foveal profile. CME - cystoid macular edema. PED - pigment epithelial detachment. IRF - intraretinal fluid. SRF - subretinal fluid. EZ - ellipsoid zone. ERM - epiretinal membrane. ORA - outer retinal atrophy. ORT - outer retinal tubulation. 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 Retinal breaks without detachment  H35.89   3. Severe myopia of both eyes  H52.13   4. Hx of syphilis  Z86.19   5. 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 Retinal edema  H35.81 OCT, Retina - OU - Both Eyes  7. Primary open angle glaucoma (POAG) of both eyes, moderate stage  H40.1132   8. Nuclear sclerosis of both eyes  H25.13   9. Glaucoma suspect of right eye  H40.001     1,2. Lattice degeneration w/ atrophic holes, OU  - OD: pigmented lattice 0430-0600, small operculated hole at 1030  - OS: opercula at 0100 and 0230, lattice from 0430-0600  - S/P laser retinopexy OD (08.21.19) -- good laser surrounding  - S/P laser retinopexy OS (09.04.19), touch up laser (10.21.19) -- good laser surrounding  - f/u 1 year  3. High myopia OU  - under the expert refractive management of Dr. Marin Comment  - OCT shows diffuse thinning of retina  - discussed findings and prognosis associated with high myopia / degenerative myopia  4. No retinal edema on exam or OCT  5. Nuclear sclerosis OU-   - The symptoms  of cataract, surgical options, and treatments and risks were discussed with patient.  - discussed diagnosis and progression  - not yet visually significant  - monitor for now  6. History of Neurosyphilis  - quant RPR 1:16 on 10/2017 from 1:32 in 2017  - FA on 8.16.19 - normal study without leakage, hyperfluorescence or vasculitis  - no ocular involvement; no inflammation in anterior or posterior segments   7. POAG OU  - under the expert management of Dr. Midge Aver  - changed brimonidine to comigan BID  - IOP today 18 OD, 16 OS   Ophthalmic Meds Ordered this visit:  No orders of the defined types were placed in this encounter.      Return in about 1 year (around 08/24/2020) for f/u lattice degeneration OU, DFE, OCT.  There are no Patient Instructions on file for this visit.   Explained the diagnoses, plan, and follow up with the patient and they expressed understanding.  Patient expressed understanding of the importance of proper follow up care.   This document serves as a record of services personally performed by Gardiner Sleeper, MD, PhD. It was created on their behalf by Leeann Must, [place], a certified ophthalmic assistant. The creation of this record is the provider's dictation and/or activities during the visit.    Electronically signed by: Leeann Must, COA '@TODAY'$ @ 1:10 PM   This document serves as a record of services personally performed by Gardiner Sleeper, MD, PhD. It was created on their behalf by Ernest Mallick, OA, an ophthalmic assistant. The creation of this record is the provider's dictation and/or activities during the visit.    Electronically signed by: Ernest Mallick, OA 03.08.2021 1:10 PM   Gardiner Sleeper, M.D., Ph.D. Diseases & Surgery of the Retina and Vitreous [facility]  I have reviewed the above  documentation for accuracy and completeness, and I agree with the above. Gardiner Sleeper, M.D., Ph.D. 08/25/19 1:10  PM   Abbreviations: M myopia (nearsighted); A astigmatism; H hyperopia (farsighted); P presbyopia; Mrx spectacle prescription;  CTL contact lenses; OD right eye; OS left eye; OU both eyes  XT exotropia; ET esotropia; PEK punctate epithelial keratitis; PEE punctate epithelial erosions; DES dry eye syndrome; MGD meibomian gland dysfunction; ATs artificial tears; PFAT's preservative free artificial tears; [place] nuclear sclerotic cataract; PSC posterior subcapsular cataract; ERM epi-retinal membrane; PVD posterior vitreous detachment; RD retinal detachment; DM diabetes mellitus; DR diabetic retinopathy; NPDR non-proliferative diabetic retinopathy; PDR proliferative diabetic retinopathy; CSME clinically significant macular edema; DME diabetic macular edema; dbh dot blot hemorrhages; CWS cotton wool spot; POAG primary open angle glaucoma; C/D cup-to-disc ratio; HVF humphrey visual field; GVF goldmann visual field; OCT optical coherence tomography; IOP intraocular pressure; BRVO Branch retinal vein occlusion; CRVO central retinal vein occlusion; CRAO central retinal artery occlusion; BRAO branch retinal artery occlusion; RT retinal tear; SB scleral buckle; PPV pars plana vitrectomy; VH Vitreous hemorrhage; PRP panretinal laser photocoagulation; IVK intravitreal kenalog; VMT vitreomacular traction; MH Macular hole;  NVD neovascularization of the disc; NVE neovascularization elsewhere; AREDS age related eye disease study; ARMD age related macular degeneration; POAG primary open angle glaucoma; EBMD epithelial/anterior basement membrane dystrophy; ACIOL anterior chamber intraocular lens; IOL intraocular lens; PCIOL posterior chamber intraocular lens; Phaco/IOL phacoemulsification with intraocular lens placement; [place] photorefractive keratectomy; LASIK laser assisted in situ keratomileusis; HTN hypertension; DM diabetes mellitus; COPD chronic obstructive pulmonary disease

## 2019-08-25 ENCOUNTER — Encounter (INDEPENDENT_AMBULATORY_CARE_PROVIDER_SITE_OTHER): Payer: Self-pay | Admitting: Ophthalmology

## 2019-08-25 ENCOUNTER — Ambulatory Visit (INDEPENDENT_AMBULATORY_CARE_PROVIDER_SITE_OTHER): Payer: Medicaid Other | Admitting: Ophthalmology

## 2019-08-25 DIAGNOSIS — H40001 Preglaucoma, unspecified, right eye: Secondary | ICD-10-CM

## 2019-08-25 DIAGNOSIS — Z8619 Personal history of other infectious and parasitic diseases: Secondary | ICD-10-CM

## 2019-08-25 DIAGNOSIS — H35413 Lattice degeneration of retina, bilateral: Secondary | ICD-10-CM | POA: Diagnosis not present

## 2019-08-25 DIAGNOSIS — H3581 Retinal edema: Secondary | ICD-10-CM

## 2019-08-25 DIAGNOSIS — H5213 Myopia, bilateral: Secondary | ICD-10-CM

## 2019-08-25 DIAGNOSIS — H401132 Primary open-angle glaucoma, bilateral, moderate stage: Secondary | ICD-10-CM

## 2019-08-25 DIAGNOSIS — H3589 Other specified retinal disorders: Secondary | ICD-10-CM

## 2019-08-25 DIAGNOSIS — H2513 Age-related nuclear cataract, bilateral: Secondary | ICD-10-CM

## 2019-08-25 DIAGNOSIS — H40051 Ocular hypertension, right eye: Secondary | ICD-10-CM

## 2019-08-27 ENCOUNTER — Ambulatory Visit: Payer: Medicaid Other | Admitting: *Deleted

## 2019-08-27 ENCOUNTER — Ambulatory Visit: Payer: Medicaid Other

## 2019-08-27 DIAGNOSIS — A523 Neurosyphilis, unspecified: Secondary | ICD-10-CM

## 2019-08-27 DIAGNOSIS — F02818 Dementia in other diseases classified elsewhere, unspecified severity, with other behavioral disturbance: Secondary | ICD-10-CM

## 2019-08-27 DIAGNOSIS — E7800 Pure hypercholesterolemia, unspecified: Secondary | ICD-10-CM

## 2019-08-27 DIAGNOSIS — F0281 Dementia in other diseases classified elsewhere with behavioral disturbance: Secondary | ICD-10-CM

## 2019-08-27 NOTE — Patient Instructions (Signed)
Visit Information  Goals Addressed            This Visit's Progress     Patient Stated   . Statement per Father/Legal Guardian "He needs long-term, supervised care." (pt-stated)       CARE PLAN ENTRY (see longitudinal plan of care for additional care plan information)   Current Barriers:  . Chronic Disease Management support, education, and care coordination needs related to HLD, Dementia, and neurosyphilis  Case Manager Clinical Goal(s):  [name] Over the next 90 days, patient will work with BSW to address needs related to ADL IADL limitations in patient with HLD, Dementia, and neurosyphilis  Interventions:  . Collaborated with BSW to initiate plan of care to address needs related to Level of care concerns in patient with HLD, Dementia, and neurosyphilis  Patient Self Care Activities:  . Performs ADL's independently . Performs IADL's independently  Initial goal documentation        The patient verbalized understanding of instructions provided today and declined a print copy of patient instruction materials.   The care management team will reach out to the patient again over the next 90 days.   Cranford Mon RN, CCM, CDCES CCM Clinic RN Care Manager [phone]

## 2019-08-27 NOTE — Patient Instructions (Signed)
Visit Information  Goals Addressed            This Visit's Progress   . Per Father/Legal Guardian "He needs long-term, supervised care." (pt-stated)       Current Barriers:  [name] Knowledge Barriers related to resources and support available to address needs related to Level of care concerns  Case Manager Clinical Goal(s):  [name] Over the next 90 days, patient will work with BSW to address needs related to Level of care concerns; seeking long term placement . Over the next 90 days, BSW will collaborate with RN Care Manager to address care management and care coordination needs  Interventions:  . Patient interviewed and appropriate assessments performed . Collaborated with RN Care Manager and patient to establish an individualized plan of care  . Provided Father/Legal Guardian with information about long term placement process . Directed Father/Legal Guardian to contact Medicaid Caseworker regarding transition to long term care. . Mailed Medicaid application to Guardian . Mailed list of Assisted Living and Nursing Facilities to Guardian . Will collaborate with provider for completion of FL2 . Assessed Father/Legal Guardian understanding of current care coordination needs and health conditions.  [name] Collaboration with RN Case Manager   Patient Self Care Activities:  . Performs ADL's independently . Performs IADL's independently  Initial goal documentation        The patient verbalized understanding of instructions provided today and declined a print copy of patient instruction materials.   The care management team will reach out to the patient again over the next 7 days days.       [name], BSW Embedded Care Coordination Social Worker [facility] [phone]

## 2019-08-27 NOTE — Chronic Care Management (AMB) (Signed)
Chronic Care Management    Social Work General Note  08/27/2019 Name: Eric Stephens MRN: [id number] DOB: 05/23/1981  Eric Stephens is a 38 y.o. year old male who is a primary care patient of Harbrecht, Lawrence, MD. The CCM was consulted to assist the patient with Level of Care Concerns.   Eric Stephens was given information about Chronic Care Management services today including:  1. CCM service includes personalized support from designated clinical staff supervised by his physician, including individualized plan of care and coordination with other care providers 2. 24/7 contact phone numbers for assistance for urgent and routine care needs. 3. Service will only be billed when office clinical staff spend 20 minutes or more in a month to coordinate care. 4. Only one practitioner may furnish and bill the service in a calendar month. 5. The patient may stop CCM services at any time (effective at the end of the month) by phone call to the office staff. 6. The patient will be responsible for cost sharing (co-pay) of up to 20% of the service fee (after annual deductible is met).  Legal Guardian agreed to services and verbal consent obtained.   Review of patient status, including review of consultants reports, relevant laboratory and other test results, and collaboration with appropriate care team members and the patient's provider was performed as part of comprehensive patient evaluation and provision of chronic care management services.    SDOH (Social Determinants of Health) assessments and interventions performed:  Yes SDOH Interventions     Most Recent Value  SDOH Interventions  SDOH Interventions for the Following Domains  Food Insecurity, Housing, Transportation  Food Insecurity Interventions  -- [no intervention needed]  Housing Interventions  Other (Comment) [Assisting with long term placement]  Transportation Interventions  -- [no intervention needed]       Outpatient Encounter  Medications as of 08/27/2019  Medication Sig  . betamethasone valerate ointment (VALISONE) 0.1 % APPLY 1 APPLICATION TOPICALLY 2 (TWO) TIMES DAILY. TO THE RASH ON THE BACK.  . brimonidine (ALPHAGAN) 0.2 % ophthalmic solution PLACE 1 DROP INTO RIGHT EYE TWICE DAILY  . cetirizine (ZYRTEC) 10 MG tablet TAKE 1 TABLET BY MOUTH EVERY DAY AS NEEDED FOR ALLERGY  . COMBIGAN 0.2-0.5 % ophthalmic solution INSTILL 1 DROP INTO BOTH EYES TWICE A DAY  . CVS SALINE NOSE SPRAY 0.65 % nasal spray PLACE 1 SPRAY INTO BOTH NOSTRILS AS NEEDED FOR CONGESTION.  . cyclobenzaprine (FLEXERIL) 10 MG tablet Take 1 tablet (10 mg total) by mouth 2 (two) times daily as needed for muscle spasms.  . diphenhydrAMINE (BENADRYL) 25 mg capsule Inject into the vein.  . gabapentin (NEURONTIN) 800 MG tablet TAKE 1 TABLET TWICE A DAY FOR ANXIETY AND IRRITABLITY  . Methylfol-Methylcob-Acetylcyst (METAFOLBIC PLUS) 6-2-600 MG TABS Take by mouth.  . polyethylene glycol powder (GLYCOLAX/MIRALAX) 17 GM/SCOOP powder Take by mouth.  . PROVENTIL HFA 108 (90 Base) MCG/ACT inhaler TAKE 2 PUFFS BY MOUTH EVERY 6 HOURS AS NEEDED FOR WHEEZE OR SHORTNESS OF BREATH  . Pyridoxine HCl 200 MG TBCR Take by mouth.  . vitamin E 180 MG (400 UNITS) capsule Take by mouth.  . zolpidem (AMBIEN) 10 MG tablet Take 10 mg by mouth at bedtime as needed.   No facility-administered encounter medications on file as of 08/27/2019.    Goals Addressed            This Visit's Progress   . Per Father/Legal Guardian "He needs long-term, supervised care." (pt-stated)         Current Barriers:  . Knowledge Barriers related to resources and support available to address needs related to Level of care concerns  Case Manager Clinical Goal(s):  . Over the next 90 days, patient will work with BSW to address needs related to Level of care concerns; seeking long term placement . Over the next 90 days, BSW will collaborate with RN Care Manager to address care management and care  coordination needs  Interventions:  . Patient interviewed and appropriate assessments performed . Collaborated with RN Care Manager and patient to establish an individualized plan of care  . Provided Father/Legal Guardian with information about long term placement process . Directed Father/Legal Guardian to contact Medicaid Caseworker regarding transition to long term care. . Mailed Medicaid application to Guardian . Mailed list of Assisted Living and Nursing Facilities to Guardian . Will collaborate with provider for completion of FL2 . Assessed Father/Legal Guardian understanding of current care coordination needs and health conditions.  . Collaboration with RN Case Manager   Patient Self Care Activities:  . Performs ADL's independently . Performs IADL's independently  Initial goal documentation         Follow Up Plan: SW will follow up with patient by phone over the next 7 days        Total time spent performing care coordination and/or care management activities with the patient by phone or face to face 30 minutes.   Lainey Nelson, BSW Embedded Care Coordination Social Worker [facility] [phone]                               

## 2019-08-27 NOTE — Chronic Care Management (AMB) (Signed)
Care Management   Initial Visit Note  08/27/2019 Name: Eric Stephens MRN: [id number] DOB: Apr 11, 1981  Subjective: N/A  Objective: Lab Results  Component Value Date   CHOL 214 (H) 01/08/2019   CHOL 163 01/08/2017   Lab Results  Component Value Date   HDL 47 01/08/2019   HDL 57 01/08/2017   Lab Results  Component Value Date   LDLCALC 151 (H) 01/08/2019   LDLCALC 93 01/08/2017   Lab Results  Component Value Date   TRIG 82 01/08/2019   TRIG 64 01/08/2017   Lab Results  Component Value Date   CHOLHDL 4.6 01/08/2019   CHOLHDL 2.9 01/08/2017   No results found for: LDLDIRECT  Assessment: Eric Stephens is a 39 y.o. year old male who sees Eric Ludwig, MD for primary care. The care management team was consulted for assistance with care management and care coordination needs related to Level of Care Concerns.   Review of patient status, including review of consultants reports, relevant laboratory and other test results, and collaboration with appropriate care team members and the patient's provider was performed as part of comprehensive patient evaluation and provision of care management services.    SDOH (Social Determinants of Health) assessments performed: No See Care Plan activities for detailed interventions related to [facility])     Outpatient Encounter Medications as of 08/27/2019  Medication Sig  . betamethasone valerate ointment (VALISONE) 0.1 % APPLY 1 APPLICATION TOPICALLY 2 (TWO) TIMES DAILY. TO THE RASH ON THE BACK.  [name] brimonidine (ALPHAGAN) 0.2 % ophthalmic solution PLACE 1 DROP INTO RIGHT EYE TWICE DAILY  . cetirizine (ZYRTEC) 10 MG tablet TAKE 1 TABLET BY MOUTH EVERY DAY AS NEEDED FOR ALLERGY  . COMBIGAN 0.2-0.5 % ophthalmic solution INSTILL 1 DROP INTO BOTH EYES TWICE A DAY  . CVS SALINE NOSE SPRAY 0.65 % nasal spray PLACE 1 SPRAY INTO BOTH NOSTRILS AS NEEDED FOR CONGESTION.  . cyclobenzaprine (FLEXERIL) 10 MG tablet Take 1 tablet (10 mg total) by mouth 2  (two) times daily as needed for muscle spasms.  . diphenhydrAMINE (BENADRYL) 25 mg capsule Inject into the vein.  [name] gabapentin (NEURONTIN) 800 MG tablet TAKE 1 TABLET TWICE A DAY FOR ANXIETY AND IRRITABLITY  . Methylfol-Methylcob-Acetylcyst (METAFOLBIC PLUS) 6-2-600 MG TABS Take by mouth.  . polyethylene glycol powder (GLYCOLAX/MIRALAX) 17 GM/SCOOP powder Take by mouth.  [name] PROVENTIL HFA 108 (90 Base) MCG/ACT inhaler TAKE 2 PUFFS BY MOUTH EVERY 6 HOURS AS NEEDED FOR WHEEZE OR SHORTNESS OF BREATH  . Pyridoxine HCl 200 MG TBCR Take by mouth.  . vitamin E 180 MG (400 UNITS) capsule Take by mouth.  . zolpidem (AMBIEN) 10 MG tablet Take 10 mg by mouth at bedtime as needed.   No facility-administered encounter medications on file as of 08/27/2019.   Goals Addressed            This Visit's Progress     Patient Stated   . Statement per Father/Legal Guardian "He needs long-term, supervised care." (pt-stated)       CARE PLAN ENTRY (see longitudinal plan of care for additional care plan information)   Current Barriers:  . Chronic Disease Management support, education, and care coordination needs related to HLD, Dementia, and neurosyphilis  Case Manager Clinical Goal(s):  [name] Over the next 90 days, patient will work with BSW to address needs related to ADL IADL limitations in patient with HLD, Dementia, and neurosyphilis  Interventions:  . Collaborated with BSW to initiate plan of care to  address needs related to Level of care concerns in patient with HLD, Dementia, and neurosyphilis  Patient Self Care Activities:  . Performs ADL's independently . Performs IADL's independently  Initial goal documentation        Follow up plan:  The care management team will reach out to the patient again over the next 90 days.   Mr. Repetto was given information about Care Management services today including:  1. Care Management services include personalized support from designated clinical staff  supervised by a physician, including individualized plan of care and coordination with other care providers 2. 24/7 contact phone numbers for assistance for urgent and routine care needs. 3. The patient may stop Care Management services at any time (effective at the end of the month) by phone call to the office staff.  Patient's father and legal guardian agreed to services and verbal consent obtained.  Cranford Mon RN, CCM, CDCES CCM Clinic RN Care Manager [phone]

## 2019-09-01 ENCOUNTER — Telehealth: Payer: Medicaid Other

## 2019-09-01 ENCOUNTER — Ambulatory Visit: Payer: Self-pay

## 2019-09-01 NOTE — Chronic Care Management (AMB) (Signed)
  Chronic Care Management   Outreach Note  09/01/2019 Name: Eric Stephens MRN: [id number] DOB: [birth year]  Referred by: Lanelle Bal, MD Reason for referral : Care Coordination (Placement)   An unsuccessful telephone outreach was attempted today. The patient was referred to the case management team for assistance with care management and care coordination.   Follow Up Plan: A HIPPA compliant phone message was left for the patient's caregiver/guaridan providing contact information and requesting a return call.      [name], BSW Embedded Care Coordination Social Worker [facility] [phone]

## 2019-09-02 ENCOUNTER — Ambulatory Visit: Payer: Medicaid Other

## 2019-09-02 DIAGNOSIS — F02818 Dementia in other diseases classified elsewhere, unspecified severity, with other behavioral disturbance: Secondary | ICD-10-CM

## 2019-09-02 DIAGNOSIS — F0281 Dementia in other diseases classified elsewhere with behavioral disturbance: Secondary | ICD-10-CM

## 2019-09-02 DIAGNOSIS — A523 Neurosyphilis, unspecified: Secondary | ICD-10-CM

## 2019-09-02 NOTE — Patient Instructions (Signed)
Visit Information  Goals Addressed            This Visit's Progress   . Per Father/Legal Guardian, "He needs long term, supervised care." (pt-stated)       Current Barriers:  [name] Knowledge Barriers related to resources and support available to address needs related to Level of care concerns  Case Manager Clinical Goal(s):  [name] Over the next 90 days, patient will work with BSW to address needs related to Level of care concerns . Over the next 90 days, BSW will collaborate with RN Care Manager to address care management and care coordination needs  Interventions:   . Contacted Father/Legal Guardian to ensure receipt of documentation mailed on 08/27/19.  Documentation was received. . Scheduled follow up call for 09/04/19 as Father/Legal Guardian was working during time of call and unable to provide needed information for [facility]  Patient Self Care Activities:  . Attends all scheduled provider appointments  Please see past updates related to this goal by clicking on the "Past Updates" button in the selected goal         Patient's Legal Guardian verbalizes understanding of instructions provided today.   Telephone follow up appointment with care management team member scheduled for:09/04/19      [name], BSW Embedded Care Coordination Social Worker [facility] [phone]

## 2019-09-02 NOTE — Chronic Care Management (AMB) (Signed)
  Care Management   Follow Up Note   09/02/2019 Name: Eric Stephens MRN: [id number] DOB: 21-Feb-1981  Referred by: Lanelle Bal, MD Reason for referral : Care Coordination (Placement)   Eric Stephens is a 39 y.o. year old male who is a primary care patient of Lanelle Bal, MD. The care management team was consulted for assistance with care management and care coordination needs.    Review of patient status, including review of consultants reports, relevant laboratory and other test results, and collaboration with appropriate care team members and the patient's provider was performed as part of comprehensive patient evaluation and provision of chronic care management services.    SDOH (Social Determinants of Health) assessments performed: No See Care Plan activities for detailed interventions related to [facility])     Advanced Directives: See Care Plan and Vynca application for related entries.   Goals Addressed            This Visit's Progress   . Per Father/Legal Guardian, "He needs long term, supervised care." (pt-stated)       Current Barriers:  [name] Knowledge Barriers related to resources and support available to address needs related to Level of care concerns  Case Manager Clinical Goal(s):  [name] Over the next 90 days, patient will work with BSW to address needs related to Level of care concerns . Over the next 90 days, BSW will collaborate with RN Care Manager to address care management and care coordination needs  Interventions:   . Contacted Father/Legal Guardian to ensure receipt of documentation mailed on 08/27/19.  Documentation was received. . Scheduled follow up call for 09/04/19 as Father/Legal Guardian was working during time of call and unable to provide needed information for [facility]  Patient Self Care Activities:  . Attends all scheduled provider appointments  Please see past updates related to this goal by clicking on the "Past Updates" button in the selected  goal          Telephone follow up appointment with care management team member scheduled for:09/04/19     [name], BSW Embedded Care Coordination Social Worker [facility] [phone]

## 2019-09-04 ENCOUNTER — Ambulatory Visit: Payer: Medicaid Other

## 2019-09-04 DIAGNOSIS — A523 Neurosyphilis, unspecified: Secondary | ICD-10-CM

## 2019-09-04 DIAGNOSIS — F02818 Dementia in other diseases classified elsewhere, unspecified severity, with other behavioral disturbance: Secondary | ICD-10-CM

## 2019-09-04 DIAGNOSIS — F0281 Dementia in other diseases classified elsewhere with behavioral disturbance: Secondary | ICD-10-CM

## 2019-09-04 NOTE — Patient Instructions (Signed)
Visit Information  Goals Addressed            This Visit's Progress   . Per Father/Legal Guardian, "He needs long term, supervised care." (pt-stated)       Current Barriers:  [name] Knowledge Barriers related to resources and support available to address needs related to Level of care concerns; placement.   Case Manager Clinical Goal(s):  [name] Over the next 90 days, patient will work with BSW to address needs related to Level of care concerns; placement . Over the next 90 days, BSW will collaborate with RN Care Manager to address care management and care coordination needs  Interventions:  . Inquired if Father was able to connect with Medicaid Caseworker to discuss transition to Long Term Care Medicaid.  Father has left messages for caseworker but has not received return call.   Geradine Girt [facility] Department of Social Services to confirm caseworker and contact information.  Was given contact information for Caseworker, Alfredo Bach.  Attempted to call Ms. Fredric Mare but number was incorrect.  Called main DSS number again and representative agreed to pass message to Ms. Fredric Mare to contact patient's Father and/or me.   . Return call from Ms. Fredric Mare.  Medicaid will be transferred to long term when patient transitions to facility.   . Called Father back to provide above update. . Encouraged Father to review information on facilities that was mailed to him to determine those that he/patient would like to pursue.   Fabio Asa information from Father for Fl2.  Will forward to provider for completion. . Will forward Fl2 to desired facilities after next follow up with Father.     Patient Self Care Activities:  . Attends all scheduled provider appointments  Please see past updates related to this goal by clicking on the "Past Updates" button in the selected goal         Patient verbalizes understanding of instructions provided today.   Telephone follow up appointment with care management team  member scheduled for:

## 2019-09-04 NOTE — Progress Notes (Signed)
Internal Medicine Clinic Resident   I have personally reviewed this encounter including the documentation in this note and/or discussed this patient with the care management provider. I will address any urgent items identified by the care management provider and will communicate my actions to the patient's PCP. I have reviewed the patient's CCM visit with my supervising attending.  Aanshi Batchelder, MD 09/04/2019    

## 2019-09-04 NOTE — Progress Notes (Signed)
Internal Medicine Clinic Resident   I have personally reviewed this encounter including the documentation in this note and/or discussed this patient with the care management provider. I will address any urgent items identified by the care management provider and will communicate my actions to the patient's PCP. I have reviewed the patient's CCM visit with my supervising attending.  Milton Sagona, MD 09/04/2019    

## 2019-09-04 NOTE — Chronic Care Management (AMB) (Signed)
  Care Management   Follow Up Note   09/04/2019 Name: Eric Stephens MRN: [id number] DOB: [birth year]  Referred by: Lanelle Bal, MD Reason for referral : Care Coordination (Long Term Care)   Eric Stephens is a 39 y.o. year old male who is a primary care patient of Lanelle Bal, MD. The care management team was consulted for assistance with care management and care coordination needs.    Review of patient status, including review of consultants reports, relevant laboratory and other test results, and collaboration with appropriate care team members and the patient's provider was performed as part of comprehensive patient evaluation and provision of chronic care management services.    SDOH (Social Determinants of Health) assessments performed: No See Care Plan activities for detailed interventions related to [facility])     Advanced Directives: See Care Plan and Vynca application for related entries.   Goals Addressed            This Visit's Progress   . Per Father/Legal Guardian, "He needs long term, supervised care." (pt-stated)       Current Barriers:  [name] Knowledge Barriers related to resources and support available to address needs related to Level of care concerns; placement.   Case Manager Clinical Goal(s):  [name] Over the next 90 days, patient will work with BSW to address needs related to Level of care concerns; placement . Over the next 90 days, BSW will collaborate with RN Care Manager to address care management and care coordination needs  Interventions:  . Inquired if Father was able to connect with Medicaid Caseworker to discuss transition to Long Term Care Medicaid.  Father has left messages for caseworker but has not received return call.   Geradine Girt [facility] Department of Social Services to confirm caseworker and contact information.  Was given contact information for Caseworker, Alfredo Bach.  Attempted to call Ms. Fredric Mare but number was incorrect.  Called  main DSS number again and representative agreed to pass message to Ms. Fredric Mare to contact patient's Father and/or me.   . Return call from Ms. Fredric Mare.  Medicaid will be transferred to long term when patient transitions to facility.   . Called Father back to provide above update. . Encouraged Father to review information on facilities that was mailed to him to determine those that he/patient would like to pursue.   Fabio Asa information from Father for Fl2.  Will forward to provider for completion. . Will forward Fl2 to desired facilities after next follow up with Father.     Patient Self Care Activities:  . Attends all scheduled provider appointments  Please see past updates related to this goal by clicking on the "Past Updates" button in the selected goal          Telephone follow up appointment with Care Management Social Worker scheduled for 09/11/19.       [name], BSW Embedded Care Coordination Social Worker [facility] [phone]

## 2019-09-04 NOTE — Chronic Care Management (AMB) (Deleted)
  Care Management   Follow Up Note   09/04/2019 Name: Eric Stephens MRN: [id number] DOB: Aug 18, 1980  Referred by: Lanelle Bal, MD Reason for referral : Care Coordination (Long Term Care)   Eric Stephens is a 39 y.o. year old male who is a primary care patient of Lanelle Bal, MD. The care management team was consulted for assistance with care management and care coordination needs.    Review of patient status, including review of consultants reports, relevant laboratory and other test results, and collaboration with appropriate care team members and the patient's provider was performed as part of comprehensive patient evaluation and provision of chronic care management services.    SDOH (Social Determinants of Health) assessments performed: No See Care Plan activities for detailed interventions related to [facility])     Advanced Directives: See Care Plan and Vynca application for related entries.   Goals Addressed            This Visit's Progress   . Per Father/Legal Guardian, "He needs long term, supervised care." (pt-stated)       Current Barriers:  [name] Knowledge Barriers related to resources and support available to address needs related to Level of care concerns; placement.   Case Manager Clinical Goal(s):  [name] Over the next 90 days, patient will work with BSW to address needs related to Level of care concerns; placement . Over the next 90 days, BSW will collaborate with RN Care Manager to address care management and care coordination needs  Interventions:  . Inquired if Father was able to connect with Medicaid Caseworker to discuss transition to Long Term Care Medicaid.  Father has left messages for caseworker but has not received return call.   Geradine Girt [facility] Department of Social Services to confirm caseworker and contact information.  Was given contact information for Caseworker, Alfredo Bach.  Attempted to call Ms. Fredric Mare but number was incorrect.  Called  main DSS number again and representative agreed to pass message to Ms. Fredric Mare to contact patient's Father and/or me.   . Called Father back to provide above update . Gathered information from Father for Fl2.  Father asked that form not be completed by provider until he is able to discuss Long Term Care Medicaid eligibility with caseworker.   . Encouraged Father to review information on facilities that was mailed to him to determine which facilities he/patient are interested in.     Patient Self Care Activities:  . Attends all scheduled provider appointments  Please see past updates related to this goal by clicking on the "Past Updates" button in the selected goal          Telephone follow up appointment with Care Management Social Worker scheduled for 09/11/19      [name], BSW Embedded Care Coordination Social Worker [facility] [phone]

## 2019-09-10 NOTE — Progress Notes (Signed)
Internal Medicine Clinic Attending  CCM services provided by the care management provider and their documentation were discussed with Dr. Harbrecht soon after the resident saw the patient.  We reviewed the pertinent findings, urgent action items addressed by the resident and non-urgent items to be addressed by the PCP.  I agree with the assessment, diagnosis, and plan of care documented in the CCM and resident's note.  Yazmen Briones, MD 09/10/2019  

## 2019-09-11 ENCOUNTER — Ambulatory Visit: Payer: Medicaid Other

## 2019-09-11 DIAGNOSIS — F02818 Dementia in other diseases classified elsewhere, unspecified severity, with other behavioral disturbance: Secondary | ICD-10-CM

## 2019-09-11 DIAGNOSIS — A523 Neurosyphilis, unspecified: Secondary | ICD-10-CM

## 2019-09-11 DIAGNOSIS — F0281 Dementia in other diseases classified elsewhere with behavioral disturbance: Secondary | ICD-10-CM

## 2019-09-11 NOTE — Progress Notes (Signed)
Internal Medicine Clinic Resident   I have personally reviewed this encounter including the documentation in this note and/or discussed this patient with the care management provider. I will address any urgent items identified by the care management provider and will communicate my actions to the patient's PCP. I have reviewed the patient's CCM visit with my supervising attending.  Judd Mccubbin, MD 09/11/2019    

## 2019-09-11 NOTE — Patient Instructions (Signed)
Visit Information  Goals Addressed            This Visit's Progress   . Per Father/Legal Guardian, "He needs long term, supervised care." (pt-stated)       Current Barriers:  [name] Knowledge Barriers related to resources and support available to address needs related to Level of care concerns; placement.   Case Manager Clinical Goal(s):  [name] Over the next 90 days, patient will work with BSW to address needs related to Level of care concerns; placement . Over the next 90 days, BSW will collaborate with RN Care Manager to address care management and care coordination needs  Interventions:  . Collaborated with Dr. Crista Elliot regarding completion of FL 2 . Follow up call to father/legal guardian.  Decision has not yet been made about what facilities to pursue.      Patient Self Care Activities:  . Attends all scheduled provider appointments  Please see past updates related to this goal by clicking on the "Past Updates" button in the selected goal         Patient verbalizes understanding of instructions provided today.   Telephone follow up appointment with care management team member scheduled for:09/29/19   [name], BSW Embedded Care Coordination Social Worker [facility] [phone]

## 2019-09-11 NOTE — Chronic Care Management (AMB) (Signed)
  Care Management   Follow Up Note   09/11/2019 Name: Eric Stephens MRN: [id number] DOB: 1981-01-22  Referred by: Eric Bal, MD Reason for referral : Care Coordination (Placement)   Eric Stephens is a 39 y.o. year old male who is a primary care patient of Eric Bal, MD. The care management team was consulted for assistance with care management and care coordination needs.    Review of patient status, including review of consultants reports, relevant laboratory and other test results, and collaboration with appropriate care team members and the patient's provider was performed as part of comprehensive patient evaluation and provision of chronic care management services.    SDOH (Social Determinants of Health) assessments performed: No See Care Plan activities for detailed interventions related to Eric Stephens)     Advanced Directives: See Care Plan and Vynca application for related entries.   Goals Addressed            This Visit's Progress   . Per Father/Legal Guardian, "He needs long term, supervised care." (pt-stated)       Current Barriers:  [name] Knowledge Barriers related to resources and support available to address needs related to Level of care concerns; placement.   Case Manager Clinical Goal(s):  [name] Over the next 90 days, patient will work with BSW to address needs related to Level of care concerns; placement . Over the next 90 days, BSW will collaborate with RN Care Manager to address care management and care coordination needs  Interventions:  . Collaborated with Dr. Crista Elliot regarding completion of FL 2 . Follow up call to father/legal guardian.  Decision has not yet been made about what facilities to pursue.      Patient Self Care Activities:  . Attends all scheduled provider appointments  Please see past updates related to this goal by clicking on the "Past Updates" button in the selected goal          Face to Face appointment with care management  team member scheduled for:  Telephone follow up appointment with care management team member scheduled for:09/29/19     [name], BSW Embedded Care Coordination Social Worker [facility] [phone]

## 2019-09-29 ENCOUNTER — Ambulatory Visit: Payer: Medicaid Other

## 2019-09-29 NOTE — Chronic Care Management (AMB) (Signed)
  Care Management   Social Work Note  09/29/2019 Name: Eric Stephens MRN: [id number] DOB: 02-Jul-1980  Eric Stephens is a 39 y.o. year old male who sees Lanelle Bal, MD for primary care. The Care Management team was consulted for assistance with placement.       Successful follow up outreach to patient's father/legal guardian, however, he requested call back on 10/03/19.         Follow Up Plan: Appointment scheduled for SW follow up with guardian by phone on 10/03/19    [name], BSW Embedded Care Coordination Social Worker [facility] [phone]

## 2019-10-03 ENCOUNTER — Ambulatory Visit: Payer: Medicaid Other

## 2019-10-03 DIAGNOSIS — F02818 Dementia in other diseases classified elsewhere, unspecified severity, with other behavioral disturbance: Secondary | ICD-10-CM

## 2019-10-03 DIAGNOSIS — A523 Neurosyphilis, unspecified: Secondary | ICD-10-CM

## 2019-10-03 DIAGNOSIS — F0281 Dementia in other diseases classified elsewhere with behavioral disturbance: Secondary | ICD-10-CM

## 2019-10-03 NOTE — Chronic Care Management (AMB) (Signed)
  Care Management   Follow Up Note   10/03/2019 Name: Eric Stephens MRN: [id number] DOB: 03-Dec-1980  Referred by: Lanelle Bal, MD Reason for referral : Care Coordination (Placement)   Eric Stephens is a 39 y.o. year old male who is a primary care patient of Lanelle Bal, MD. The care management team was consulted for assistance with care management and care coordination needs.    Review of patient status, including review of consultants reports, relevant laboratory and other test results, and collaboration with appropriate care team members and the patient's provider was performed as part of comprehensive patient evaluation and provision of chronic care management services.    SDOH (Social Determinants of Health) assessments performed: No See Care Plan activities for detailed interventions related to [facility])     Advanced Directives: See Care Plan and Vynca application for related entries.   Goals Addressed            This Visit's Progress   . Per Father/Legal Guardian, "He needs long term, supervised care." (pt-stated)       Current Barriers:  [name] Knowledge Barriers related to resources and support available to address needs related to Level of care concerns; placement.   Case Manager Clinical Goal(s):  [name] Over the next 90 days, patient will work with BSW to address needs related to Level of care concerns; placement . Over the next 90 days, BSW will collaborate with RN Care Manager to address care management and care coordination needs  Interventions:  . Talked to patient's father/legal guardian and contacted facilities per his request . Contacted the following facilities regarding eligibility for placement.  They do not accept Special Assistance and patient does not meet age requirement.  [address], [id number], [employer]. [facility].  . Contacted the following facilities that accept Special Assistance, however, patient does  not meet age requirement.  [place], [address] . Contacted [facility] but Admissions Coordinator was out of the office.  . Talked to [facility] in Admissions Department for [facility].  They accept Special Assistance and have bed availability in 18 bed facility.  Jeanene Erb father/legal guardian back and provided him with contact information for [place] [facility].  Encouraged him to call her ASAP with any questions he may have regarding the facility and to request appointment if desired.       Patient Self Care Activities:  . Attends all scheduled provider appointments  Please see past updates related to this goal by clicking on the "Past Updates" button in the selected goal          Telephone follow up appointment with care management team member scheduled for:10/07/19      [name], BSW Embedded Care Coordination Social Worker [facility] [phone]

## 2019-10-03 NOTE — Patient Instructions (Signed)
Visit Information  Goals Addressed            This Visit's Progress   . Per Father/Legal Guardian, "He needs long term, supervised care." (pt-stated)       Current Barriers:  [name] Knowledge Barriers related to resources and support available to address needs related to Level of care concerns; placement.   Case Manager Clinical Goal(s):  [name] Over the next 90 days, patient will work with BSW to address needs related to Level of care concerns; placement . Over the next 90 days, BSW will collaborate with RN Care Manager to address care management and care coordination needs  Interventions:  . Talked to patient's father/legal guardian and contacted facilities per his request . Contacted the following facilities regarding eligibility for placement.  They do not accept Special Assistance and patient does not meet age requirement.  [address], [address], [address], [employer]. [facility].  . Contacted the following facilities that accept Special Assistance, however, patient does not meet age requirement.  [place], [address] . Contacted [facility] but Admissions Coordinator was out of the office.  . Talked to [facility] in Admissions Department for [facility].  They accept Special Assistance and have bed availability in 18 bed facility.  Jeanene Erb father/legal guardian back and provided him with contact information for [place] [facility].  Encouraged him to call her ASAP with any questions he may have regarding the facility and to request appointment if desired.       Patient Self Care Activities:  . Attends all scheduled provider appointments  Please see past updates related to this goal by clicking on the "Past Updates" button in the selected goal         Patient verbalizes understanding of instructions provided today.   Face to Face appointment with care management team member scheduled for: 10/07/19    [name],  BSW Embedded Care Coordination Social Worker [facility] [phone]

## 2019-10-06 NOTE — Progress Notes (Signed)
Internal Medicine Clinic Resident  I have personally reviewed this encounter including the documentation in this note and/or discussed this patient with the care management provider. I will address any urgent items identified by the care management provider and will communicate my actions to the patient's PCP. I have reviewed the patient's CCM visit with my supervising attending.  Ankita Newcomer, MD 10/06/2019    

## 2019-10-07 ENCOUNTER — Ambulatory Visit: Payer: Medicaid Other

## 2019-10-07 DIAGNOSIS — F0281 Dementia in other diseases classified elsewhere with behavioral disturbance: Secondary | ICD-10-CM

## 2019-10-07 DIAGNOSIS — F02818 Dementia in other diseases classified elsewhere, unspecified severity, with other behavioral disturbance: Secondary | ICD-10-CM

## 2019-10-07 DIAGNOSIS — A523 Neurosyphilis, unspecified: Secondary | ICD-10-CM

## 2019-10-07 NOTE — Progress Notes (Signed)
Internal Medicine Clinic Attending  CCM services provided by the care management provider and their documentation were discussed with Dr. Jones. We reviewed the pertinent findings, urgent action items addressed by the resident and non-urgent items to be addressed by the PCP.  I agree with the assessment, diagnosis, and plan of care documented in the CCM and resident's note.  Spenser Harren C Candida Vetter, DO 10/07/2019  

## 2019-10-07 NOTE — Patient Instructions (Signed)
Visit Information  Goals Addressed            This Visit's Progress   . Per Father/Legal Guardian, "He needs long term, supervised care." (pt-stated)       Current Barriers:  [name] Knowledge Barriers related to resources and support available to address needs related to Level of care concerns; placement.   Case Manager Clinical Goal(s):  [name] Over the next 90 days, patient will work with BSW to address needs related to Level of care concerns; placement . Over the next 90 days, BSW will collaborate with RN Care Manager to address care management and care coordination needs  Interventions:  . [place] regarding eligibility/availability.   [place]ed [place] for [place] . [facility]ed [facility] egarding eligibility/availability.   [place]ed [place] for [place] . Called Father to inquire if he had opportunity to visit [place].  Father did drive by facility but has not met with or contacted staff.  Father stated that he read some reviews about the facility and is not certain about placement for patient there.  He did request that [place] 2 be submitted for review.  Renato Gails consent from Father to submit [place] 2/medical records to [facility], [facility], and [place]. . [place] 2 faxed to aforementioned facilities.        Patient Self Care Activities:  . Attends all scheduled provider appointments  Please see past updates related to this goal by clicking on the "Past Updates" button in the selected goal         Patient verbalizes understanding of instructions provided today.   Telephone follow up appointment with care management team member scheduled for:10/17/19     Ronn Melena, [place] Coordination Social Worker [place] [phone]

## 2019-10-07 NOTE — Progress Notes (Signed)
Internal Medicine Clinic Resident   I have personally reviewed this encounter including the documentation in this note and/or discussed this patient with the care management provider. I will address any urgent items identified by the care management provider and will communicate my actions to the patient's PCP. I have reviewed the patient's CCM visit with my supervising attending.  Abhishek Levesque, MD 10/07/2019   

## 2019-10-07 NOTE — Progress Notes (Signed)
Internal Medicine Clinic Attending  CCM services provided by the care management provider and their documentation were discussed with Dr. Winters. We reviewed the pertinent findings, urgent action items addressed by the resident and non-urgent items to be addressed by the PCP.  I agree with the assessment, diagnosis, and plan of care documented in the CCM and resident's note.  Jorge Amparo, MD 10/07/2019  

## 2019-10-07 NOTE — Chronic Care Management (AMB) (Signed)
  Care Management   Follow Up Note   10/07/2019 Name: STEFAN MARKARIAN MRN: [id number] DOB: 12-30-1980  Referred by: Kathi Ludwig, MD Reason for referral : Care Coordination (Placement)   YOHANCE HATHORNE is a 39 y.o. year old male who is a primary care patient of Kathi Ludwig, MD. The care management team was consulted for assistance with care management and care coordination needs.    Review of patient status, including review of consultants reports, relevant laboratory and other test results, and collaboration with appropriate care team members and the patient's provider was performed as part of comprehensive patient evaluation and provision of chronic care management services.    SDOH (Social Determinants of Health) assessments performed: No See Care Plan activities for detailed interventions related to [facility])     Advanced Directives: See Care Plan and Vynca application for related entries.   Goals Addressed            This Visit's Progress   . Per Father/Legal Guardian, "He needs long term, supervised care." (pt-stated)       Current Barriers:  [name] Knowledge Barriers related to resources and support available to address needs related to Level of care concerns; placement.   Case Manager Clinical Goal(s):  [name] Over the next 90 days, patient will work with BSW to address needs related to Level of care concerns; placement . Over the next 90 days, BSW will collaborate with RN Care Manager to address care management and care coordination needs  Interventions:  . [place] regarding eligibility/availability.   Requested [place] 2 for review . [facility]ed [facility] egarding eligibility/availability.   Requested [place] 2 for review . Called Father to inquire if he had opportunity to visit [place].  Father did drive by facility but has not met with or contacted staff.  Father stated that he read some reviews about the facility and is not certain about placement  for patient there.  He did request that [place] 2 be submitted for review.  Renato Gails consent from Father to submit [place] 2/medical records to [facility], [facility], and [place]. . [place] 2 faxed to aforementioned facilities.        Patient Self Care Activities:  . Attends all scheduled provider appointments  Please see past updates related to this goal by clicking on the "Past Updates" button in the selected goal          Telephone follow up appointment with care management team member scheduled for:10/17/19    Ronn Melena, [place] Worker [place] [phone]

## 2019-10-15 ENCOUNTER — Ambulatory Visit: Payer: Self-pay

## 2019-10-15 DIAGNOSIS — F02818 Dementia in other diseases classified elsewhere, unspecified severity, with other behavioral disturbance: Secondary | ICD-10-CM

## 2019-10-15 DIAGNOSIS — F0281 Dementia in other diseases classified elsewhere with behavioral disturbance: Secondary | ICD-10-CM

## 2019-10-15 DIAGNOSIS — A523 Neurosyphilis, unspecified: Secondary | ICD-10-CM

## 2019-10-15 NOTE — Chronic Care Management (AMB) (Signed)
  Care Management   Follow Up Note   10/15/2019 Name: Eric Stephens MRN: [id number] DOB: 04-01-1981  Referred by: Lanelle Bal, MD Reason for referral : Care Coordination (Placement)   Eric Stephens is a 39 y.o. year old male who is a primary care patient of Lanelle Bal, MD. The care management team was consulted for assistance with care management and care coordination needs.    Review of patient status, including review of consultants reports, relevant laboratory and other test results, and collaboration with appropriate care team members and the patient's provider was performed as part of comprehensive patient evaluation and provision of chronic care management services.    SDOH (Social Determinants of Health) assessments performed: No See Care Plan activities for detailed interventions related to [facility])     Advanced Directives: See Care Plan and Vynca application for related entries.   Goals Addressed            This Visit's Progress   . Per Father/Legal Guardian, "He needs long term, supervised care." (pt-stated)       Current Barriers:  [name] Knowledge Barriers related to resources and support available to address needs related to Level of care concerns; placement.   Case Manager Clinical Goal(s):  [name] Over the next 90 days, patient will work with BSW to address needs related to Level of care concerns; placement . Over the next 90 days, BSW will collaborate with RN Care Manager to address care management and care coordination needs  Interventions:  . Contacted [name] at [facility] for follow up on [facility] 2/medical records faxed on 10/07/19.  States fax was not received.  Requested that documents be sent via secure e mail. Documents sent to e mail address provided.   Geradine Girt Blondie at [facility] for follow up on [facility] 2/medical records faxed on 10/07/19. States fax not received.  Faxed again . Attempted to contact [facility] multiple times for  follow up on [place] 2/medical records faxed on 10/09/19.  Phone and fax are same numbers.  Went to fax machine at every call     Patient Self Care Activities:  . Attends all scheduled provider appointments  Please see past updates related to this goal by clicking on the "Past Updates" button in the selected goal          Telephone follow up appointment with care management team member scheduled for:10/17/19      [name], BSW Embedded Care Coordination Social Worker [facility] Internal Medicine Center [phone]

## 2019-10-15 NOTE — Progress Notes (Signed)
Internal Medicine Clinic Resident   I have personally reviewed this encounter including the documentation in this note and/or discussed this patient with the care management provider. I will address any urgent items identified by the care management provider and will communicate my actions to the patient's PCP. I have reviewed the patient's CCM visit with my supervising attending.  Brandon Brice Potteiger, MD 10/15/2019    

## 2019-10-15 NOTE — Patient Instructions (Signed)
Visit Information  Goals Addressed            This Visit's Progress   . Per Father/Legal Guardian, "He needs long term, supervised care." (pt-stated)       Current Barriers:  [name] Knowledge Barriers related to resources and support available to address needs related to Level of care concerns; placement.   Case Manager Clinical Goal(s):  [name] Over the next 90 days, patient will work with BSW to address needs related to Level of care concerns; placement . Over the next 90 days, BSW will collaborate with RN Care Manager to address care management and care coordination needs  Interventions:  . Contacted [name] at [facility] for follow up on [facility] 2/medical records faxed on 10/07/19.  States fax was not received.  Requested that documents be sent via secure e mail. Documents sent to e mail address provided.   Geradine Girt Blondie at [facility] for follow up on [facility] 2/medical records faxed on 10/07/19. States fax not received.  Faxed again . Attempted to contact [facility] multiple times for follow up on [place] 2/medical records faxed on 10/09/19.  Phone and fax are same numbers.  Went to fax machine at every call     Patient Self Care Activities:  . Attends all scheduled provider appointments  Please see past updates related to this goal by clicking on the "Past Updates" button in the selected goal           Telephone follow up appointment with care management team member scheduled for:10/17/19    [name], BSW Embedded Care Coordination Social Worker [facility] [phone]

## 2019-10-17 ENCOUNTER — Ambulatory Visit: Payer: Medicaid Other

## 2019-10-17 NOTE — Chronic Care Management (AMB) (Signed)
  Care Management   Follow Up Note   10/17/2019 Name: Eric Stephens MRN: [id number] DOB: 1981-01-02  Referred by: Lanelle Bal, MD Reason for referral : Care Coordination (Placement)   Eric Stephens is a 39 y.o. year old male who is a primary care patient of Lanelle Bal, MD. The care management team was consulted for assistance with care management and care coordination needs.    Review of patient status, including review of consultants reports, relevant laboratory and other test results, and collaboration with appropriate care team members and the patient's provider was performed as part of comprehensive patient evaluation and provision of chronic care management services.    SDOH (Social Determinants of Health) assessments performed: No See Care Plan activities for detailed interventions related to [facility])     Advanced Directives: See Care Plan and Vynca application for related entries.   Goals Addressed            This Visit's Progress   . Per Father/Legal Guardian, "He needs long term, supervised care." (pt-stated)       Current Barriers:  [name] Knowledge Barriers related to resources and support available to address needs related to Level of care concerns; placement.   Case Manager Clinical Goal(s):  [name] Over the next 90 days, patient will work with BSW to address needs related to Level of care concerns; placement . Over the next 90 days, BSW will collaborate with RN Care Manager to address care management and care coordination needs  Interventions:  . Contacted [name] at [facility] for follow up on [facility] 2/medical records that were sent for the second time on 10/15/19.   [name] stated that she has some follow up questions to determine appropriateness but she was not at her office at time of call.  Stated she would call back.  . Left message for Blondie at [facility] for follow up on [place] 2/medical records faxed for second time on 10/15/19.  [name] Contacted  Karl Bales with [facility] to follow up on [facility] 2/medical records faxed.  Ms. Katrinka Blazing states that population is currently all male so they are only accepting females for admission.  [name] Contacted patient's father/legal guardian and provided the above update.     Patient Self Care Activities:  . Attends all scheduled provider appointments  Please see past updates related to this goal by clicking on the "Past Updates" button in the selected goal          The patient's father/legal guardian has been provided with contact information for the care management team and has been advised to call with any health related questions or concerns.        [name], BSW Embedded Care Coordination Social Worker [facility] [phone]

## 2019-10-17 NOTE — Progress Notes (Signed)
Internal Medicine Clinic Resident   I have personally reviewed this encounter including the documentation in this note and/or discussed this patient with the care management provider. I will address any urgent items identified by the care management provider and will communicate my actions to the patient's PCP. I have reviewed the patient's CCM visit with my supervising attending.  Brandon Gautam Langhorst, MD 10/17/2019    

## 2019-10-18 NOTE — Progress Notes (Signed)
Internal Medicine Clinic Attending  CCM services provided by the care management provider and their documentation were discussed with Dr. Winfrey. We reviewed the pertinent findings, urgent action items addressed by the resident and non-urgent items to be addressed by the PCP.  I agree with the assessment, diagnosis, and plan of care documented in the CCM and resident's note.  Lakyia Behe, MD 10/18/2019  

## 2019-10-20 ENCOUNTER — Encounter: Payer: Self-pay | Admitting: *Deleted

## 2019-10-20 NOTE — Progress Notes (Signed)
Internal Medicine Clinic Attending  CCM services provided by the care management provider and their documentation were discussed with Dr. Winfrey. We reviewed the pertinent findings, urgent action items addressed by the resident and non-urgent items to be addressed by the PCP.  I agree with the assessment, diagnosis, and plan of care documented in the CCM and resident's note.  Ying Rocks, MD 10/20/2019  

## 2019-10-21 ENCOUNTER — Ambulatory Visit: Payer: Self-pay

## 2019-10-21 DIAGNOSIS — A523 Neurosyphilis, unspecified: Secondary | ICD-10-CM

## 2019-10-21 DIAGNOSIS — F02818 Dementia in other diseases classified elsewhere, unspecified severity, with other behavioral disturbance: Secondary | ICD-10-CM

## 2019-10-21 DIAGNOSIS — F0281 Dementia in other diseases classified elsewhere with behavioral disturbance: Secondary | ICD-10-CM

## 2019-10-21 NOTE — Patient Instructions (Signed)
Visit Information  Goals Addressed            This Visit's Progress   . Per Father/Legal Guardian, "He needs long term, supervised care." (pt-stated)       Current Barriers:  [name] Knowledge Barriers related to resources and support available to address needs related to Level of care concerns; placement.   Case Manager Clinical Goal(s):  [name] Over the next 90 days, patient will work with BSW to address needs related to Level of care concerns; placement . Over the next 90 days, BSW will collaborate with RN Care Manager to address care management and care coordination needs  Interventions:  . Contacted [name] [phone]) at [facility] regarding update on possible placement.  She requested that virtual visit be scheduled with patient and legal guardian.  [name] Contacted patient's father/legal guardian and provided him with Ms. Forman's contact information and encouraged him to call to schedule visit.       Patient Self Care Activities:  . Attends all scheduled provider appointments  Please see past updates related to this goal by clicking on the "Past Updates" button in the selected goal         Patient verbalizes understanding of instructions provided today.   Telephone follow up appointment with care management team member scheduled for:10/28/19  [name], BSW Embedded Care Coordination Social Worker [facility] Internal Medicine Center [phone]

## 2019-10-21 NOTE — Chronic Care Management (AMB) (Signed)
  Care Management   Follow Up Note   10/21/2019 Name: DEMARIO FANIEL MRN: [id number] DOB: 09/16/1980  Referred by: Lanelle Bal, MD Reason for referral : Care Coordination (Placement)   ABDEL EFFINGER is a 39 y.o. year old male who is a primary care patient of Lanelle Bal, MD. The care management team was consulted for assistance with care management and care coordination needs.    Review of patient status, including review of consultants reports, relevant laboratory and other test results, and collaboration with appropriate care team members and the patient's provider was performed as part of comprehensive patient evaluation and provision of chronic care management services.    SDOH (Social Determinants of Health) assessments performed: No See Care Plan activities for detailed interventions related to [facility])     Advanced Directives: See Care Plan and Vynca application for related entries.   Goals Addressed            This Visit's Progress   . Per Father/Legal Guardian, "He needs long term, supervised care." (pt-stated)       Current Barriers:  [name] Knowledge Barriers related to resources and support available to address needs related to Level of care concerns; placement.   Case Manager Clinical Goal(s):  [name] Over the next 90 days, patient will work with BSW to address needs related to Level of care concerns; placement . Over the next 90 days, BSW will collaborate with RN Care Manager to address care management and care coordination needs  Interventions:  . Contacted [name] [phone]) at [facility] regarding update on possible placement.  She requested that virtual visit be scheduled with patient and legal guardian.  [name] Contacted patient's father/legal guardian and provided him with Ms. Forman's contact information and encouraged him to call to schedule visit.       Patient Self Care Activities:  . Attends all scheduled provider appointments  Please see  past updates related to this goal by clicking on the "Past Updates" button in the selected goal          Telephone follow up appointment with care management team member scheduled for:10/28/19    [name], BSW Embedded Care Coordination Social Worker [facility] [phone]

## 2019-10-22 NOTE — Progress Notes (Signed)
Internal Medicine Clinic Resident   I have personally reviewed this encounter including the documentation in this note and/or discussed this patient with the care management provider. I will address any urgent items identified by the care management provider and will communicate my actions to the patient's PCP. I have reviewed the patient's CCM visit with my supervising attending.  Ardell Aaronson, MD 10/22/2019    

## 2019-10-24 NOTE — Progress Notes (Signed)
Internal Medicine Clinic Attending  CCM services provided by the care management provider and their documentation were discussed with Dr. Chundi. We reviewed the pertinent findings, urgent action items addressed by the resident and non-urgent items to be addressed by the PCP.  I agree with the assessment, diagnosis, and plan of care documented in the CCM and resident's note.  Marlea Gambill C Shelton Soler, DO 10/24/2019  

## 2019-10-28 ENCOUNTER — Ambulatory Visit: Payer: Medicaid Other

## 2019-10-28 DIAGNOSIS — A523 Neurosyphilis, unspecified: Secondary | ICD-10-CM

## 2019-10-28 DIAGNOSIS — F02818 Dementia in other diseases classified elsewhere, unspecified severity, with other behavioral disturbance: Secondary | ICD-10-CM

## 2019-10-28 DIAGNOSIS — F0281 Dementia in other diseases classified elsewhere with behavioral disturbance: Secondary | ICD-10-CM

## 2019-10-28 NOTE — Patient Instructions (Signed)
Visit Information  Goals Addressed            This Visit's Progress   . Per Father/Legal Guardian, "He needs long term, supervised care." (pt-stated)       Current Barriers:  [name] Knowledge Barriers related to resources and support available to address needs related to Level of care concerns; placement.   Case Manager Clinical Goal(s):  [name] Over the next 90 days, patient will work with BSW to address needs related to Level of care concerns; placement . Over the next 90 days, BSW will collaborate with RN Care Manager to address care management and care coordination needs  Interventions:  . Contacted patient's father/legal guardian for update on possible placement at [facility].  Father confirmed that he has been in touch with [name] from facility.  Requested that I contact her regarding status of social security/medicaid.   Geradine Girt [name] [phone])  Awaiting call back from her as she states that she needs to collaborate with contact at [employer] [facility] [employer].     Patient Self Care Activities:  . Attends all scheduled provider appointments  Please see past updates related to this goal by clicking on the "Past Updates" button in the selected goal         The patient's guardian has been provided with contact information for the care management team and has been advised to call with any health related questions or concerns.        [name], BSW Embedded Care Coordination Social Worker [facility] Medicine Center [phone]

## 2019-10-28 NOTE — Chronic Care Management (AMB) (Signed)
  Care Management   Follow Up Note   10/28/2019 Name: Eric Stephens MRN: [id number] DOB: [birth year]  Referred by: Lanelle Bal, MD Reason for referral : Care Coordination (Placement)   Eric Stephens is a 39 y.o. year old male who is a primary care patient of Lanelle Bal, MD. The care management team was consulted for assistance with care management and care coordination needs.    Review of patient status, including review of consultants reports, relevant laboratory and other test results, and collaboration with appropriate care team members and the patient's provider was performed as part of comprehensive patient evaluation and provision of chronic care management services.    SDOH (Social Determinants of Health) assessments performed: No See Care Plan activities for detailed interventions related to [facility])     Advanced Directives: See Care Plan and Vynca application for related entries.   Goals Addressed            This Visit's Progress   . Per Father/Legal Guardian, "He needs long term, supervised care." (pt-stated)       Current Barriers:  [name] Knowledge Barriers related to resources and support available to address needs related to Level of care concerns; placement.   Case Manager Clinical Goal(s):  [name] Over the next 90 days, patient will work with BSW to address needs related to Level of care concerns; placement . Over the next 90 days, BSW will collaborate with RN Care Manager to address care management and care coordination needs  Interventions:  . Contacted patient's father/legal guardian for update on possible placement at [facility].  Father confirmed that he has been in touch with [name] from facility.  Requested that I contact her regarding status of social security/medicaid.   Geradine Girt [name] [phone])  Awaiting call back from her as she states that she needs to collaborate with contact at Department of Social Services.     Patient  Self Care Activities:  . Attends all scheduled provider appointments  Please see past updates related to this goal by clicking on the "Past Updates" button in the selected goal           The patient's guardian has been provided with contact information for the care management team and has been advised to call with any health related questions or concerns.        [name], BSW Embedded Care Coordination Social Worker [facility] [phone]

## 2019-10-30 ENCOUNTER — Ambulatory Visit: Payer: Medicaid Other

## 2019-10-30 DIAGNOSIS — F0281 Dementia in other diseases classified elsewhere with behavioral disturbance: Secondary | ICD-10-CM

## 2019-10-30 DIAGNOSIS — F02818 Dementia in other diseases classified elsewhere, unspecified severity, with other behavioral disturbance: Secondary | ICD-10-CM

## 2019-10-30 DIAGNOSIS — A523 Neurosyphilis, unspecified: Secondary | ICD-10-CM

## 2019-10-30 NOTE — Patient Instructions (Signed)
Visit Information  Goals Addressed            This Visit's Progress   . Per Father/Legal Guardian, "He needs long term, supervised care." (pt-stated)       Current Barriers:  [name] Knowledge Barriers related to resources and support available to address needs related to Level of care concerns; placement.   Case Manager Clinical Goal(s):  [name] Over the next 90 days, patient will work with BSW to address needs related to Level of care concerns; placement . Over the next 90 days, BSW will collaborate with RN Care Manager to address care management and care coordination needs  Interventions:  . Talked to [name] from [facility].  [name] has been in communication with DSS Caseworker who confirmed that patient/guardian will apply for Special Assistance and SSI upon placement.   . Talked with [name] about what is needed at this time to move forward with placement.  Need new FL 2, current TB, negative COVID test,  proof of COVID vaccinations . Contacted patient's father/guardian to provide above update.   . Encouraged guardian to contact [name] to schedule a time for him and patient to visit facility as they have not yet been inside.   . Encouraged father/patient to prepare list of any questions they have prior to visiting facility and meeting with Ms. [facility]   Patient Self Care Activities:  . Attends all scheduled provider appointments  Please see past updates related to this goal by clicking on the "Past Updates" button in the selected goal         Patient's guardian verbalizes understanding of instructions provided today.   Face to Face appointment with care management team member scheduled for: 11/06/19   [name], BSW Embedded Care Coordination Social Worker [facility] [phone]

## 2019-10-30 NOTE — Chronic Care Management (AMB) (Signed)
  Care Management   Follow Up Note   10/30/2019 Name: Eric Stephens MRN: [id number] DOB: 02/20/1981  Referred by: Lanelle Bal, MD Reason for referral : Care Coordination (Placement)   Eric Stephens is a 39 y.o. year old male who is a primary care patient of Lanelle Bal, MD. The care management team was consulted for assistance with care management and care coordination needs.    Review of patient status, including review of consultants reports, relevant laboratory and other test results, and collaboration with appropriate care team members and the patient's provider was performed as part of comprehensive patient evaluation and provision of chronic care management services.    SDOH (Social Determinants of Health) assessments performed: No See Care Plan activities for detailed interventions related to [facility])     Advanced Directives: See Care Plan and Vynca application for related entries.   Goals Addressed            This Visit's Progress   . Per Father/Legal Guardian, "He needs long term, supervised care." (pt-stated)       Current Barriers:  [name] Knowledge Barriers related to resources and support available to address needs related to Level of care concerns; placement.   Case Manager Clinical Goal(s):  [name] Over the next 90 days, patient will work with BSW to address needs related to Level of care concerns; placement . Over the next 90 days, BSW will collaborate with RN Care Manager to address care management and care coordination needs  Interventions:  . Talked to [name] from [facility].  [name] has been in communication with DSS Caseworker who confirmed that patient/guardian will apply for Special Assistance and SSI upon placement.   . Talked with [name] about what is needed at this time to move forward with placement.  Need new FL 2, current TB, negative COVID test,  proof of COVID vaccinations . Contacted patient's father/guardian to provide above  update.   . Encouraged guardian to contact [name] to schedule a time for him and patient to visit facility as they have not yet been inside.   . Encouraged father/patient to prepare list of any questions they have prior to visiting facility and meeting with Ms. [facility]   Patient Self Care Activities:  . Attends all scheduled provider appointments  Please see past updates related to this goal by clicking on the "Past Updates" button in the selected goal          Face to Face appointment with care management team member scheduled for: 11/06/19      Eric Stephens, BSW Embedded Care Coordination Social Worker [facility] [phone]

## 2019-11-03 NOTE — Progress Notes (Signed)
Internal Medicine Clinic Resident  I have personally reviewed this encounter including the documentation in this note and/or discussed this patient with the care management provider. I will address any urgent items identified by the care management provider and will communicate my actions to the patient's PCP. I have reviewed the patient's CCM visit with my supervising attending, Dr Raines.  Rosalie Gelpi K Trisa Cranor, MD 11/03/2019   

## 2019-11-03 NOTE — Progress Notes (Signed)
Internal Medicine Clinic Resident  I have personally reviewed this encounter including the documentation in this note and/or discussed this patient with the care management provider. I will address any urgent items identified by the care management provider and will communicate my actions to the patient's PCP. I have reviewed the patient's CCM visit with my supervising attending, Dr Raines.  Joshua K Lee, MD 11/03/2019   

## 2019-11-06 ENCOUNTER — Ambulatory Visit: Payer: Medicaid Other

## 2019-11-06 DIAGNOSIS — F0281 Dementia in other diseases classified elsewhere with behavioral disturbance: Secondary | ICD-10-CM

## 2019-11-06 DIAGNOSIS — F02818 Dementia in other diseases classified elsewhere, unspecified severity, with other behavioral disturbance: Secondary | ICD-10-CM

## 2019-11-06 DIAGNOSIS — A523 Neurosyphilis, unspecified: Secondary | ICD-10-CM

## 2019-11-06 NOTE — Progress Notes (Signed)
Internal Medicine Clinic Attending  CCM services provided by the care management provider and their documentation were discussed with Dr. Aslam. We reviewed the pertinent findings, urgent action items addressed by the resident and non-urgent items to be addressed by the PCP.  I agree with the assessment, diagnosis, and plan of care documented in the CCM and resident's note.  Allura Doepke Thomas Coumba Kellison, MD 11/06/2019  

## 2019-11-06 NOTE — Patient Instructions (Signed)
Visit Information  Goals Addressed            This Visit's Progress   . Per Father/Legal Guardian, "He needs long term, supervised care." (pt-stated)       Current Barriers:  [name] Knowledge Barriers related to resources and support available to address needs related to Level of care concerns; placement.   Case Manager Clinical Goal(s):  [name] Over the next 90 days, patient will work with BSW to address needs related to Level of care concerns; placement . Over the next 90 days, BSW will collaborate with RN Care Manager to address care management and care coordination needs  Interventions:  . Contacted patient's father/legal guardian regarding status of potential placement at [facility]  Patient/father are scheduled to visit facility on 11/07/19  Patient Self Care Activities:  . Attends all scheduled provider appointments  Please see past updates related to this goal by clicking on the "Past Updates" button in the selected goal         Patient's guardian verbalizes understanding of instructions provided today.   Telephone follow up appointment with care management team member scheduled for:11/11/19    Eric Stephens, BSW Embedded Care Coordination Social Worker [facility] [phone]

## 2019-11-06 NOTE — Progress Notes (Signed)
Internal Medicine Clinic Resident  I have personally reviewed this encounter including the documentation in this note and/or discussed this patient with the care management provider. I will address any urgent items identified by the care management provider and will communicate my actions to the patient's PCP. I have reviewed the patient's CCM visit with my supervising attending, Dr Vincent.  Katheleen Stella, MD  Internal Medicine, PGY-1 11/06/2019    

## 2019-11-06 NOTE — Chronic Care Management (AMB) (Signed)
  Care Management   Follow Up Note   11/06/2019 Name: MARTAVIUS LUSTY MRN: [id number] DOB: June 05, 1981  Referred by: Lanelle Bal, MD Reason for referral : Care Coordination (Placement)   RODNEY YERA is a 39 y.o. year old male who is a primary care patient of Lanelle Bal, MD. The care management team was consulted for assistance with care management and care coordination needs.    Review of patient status, including review of consultants reports, relevant laboratory and other test results, and collaboration with appropriate care team members and the patient's provider was performed as part of comprehensive patient evaluation and provision of chronic care management services.    SDOH (Social Determinants of Health) assessments performed: No See Care Plan activities for detailed interventions related to [facility])     Advanced Directives: See Care Plan and Vynca application for related entries.   Goals Addressed            This Visit's Progress   . Per Father/Legal Guardian, "He needs long term, supervised care." (pt-stated)       Current Barriers:  [name] Knowledge Barriers related to resources and support available to address needs related to Level of care concerns; placement.   Case Manager Clinical Goal(s):  [name] Over the next 90 days, patient will work with BSW to address needs related to Level of care concerns; placement . Over the next 90 days, BSW will collaborate with RN Care Manager to address care management and care coordination needs  Interventions:  . Contacted patient's father/legal guardian regarding status of potential placement at [facility].  Patient/father are scheduled to visit facility on 11/07/19  Patient Self Care Activities:  . Attends all scheduled provider appointments  Please see past updates related to this goal by clicking on the "Past Updates" button in the selected goal          Telephone follow up appointment with care management team  member scheduled for:11/11/19    [name], BSW Embedded Care Coordination Social Worker [facility] [phone]

## 2019-11-11 ENCOUNTER — Ambulatory Visit: Payer: Medicaid Other

## 2019-11-11 ENCOUNTER — Other Ambulatory Visit: Payer: Self-pay

## 2019-11-11 ENCOUNTER — Encounter: Payer: Self-pay | Admitting: Podiatry

## 2019-11-11 ENCOUNTER — Ambulatory Visit (INDEPENDENT_AMBULATORY_CARE_PROVIDER_SITE_OTHER): Payer: Medicaid Other | Admitting: Podiatry

## 2019-11-11 DIAGNOSIS — B351 Tinea unguium: Secondary | ICD-10-CM

## 2019-11-11 DIAGNOSIS — F02818 Dementia in other diseases classified elsewhere, unspecified severity, with other behavioral disturbance: Secondary | ICD-10-CM

## 2019-11-11 DIAGNOSIS — A523 Neurosyphilis, unspecified: Secondary | ICD-10-CM

## 2019-11-11 DIAGNOSIS — M79674 Pain in right toe(s): Secondary | ICD-10-CM

## 2019-11-11 DIAGNOSIS — F0281 Dementia in other diseases classified elsewhere with behavioral disturbance: Secondary | ICD-10-CM

## 2019-11-11 DIAGNOSIS — M79675 Pain in left toe(s): Secondary | ICD-10-CM | POA: Diagnosis not present

## 2019-11-11 NOTE — Progress Notes (Signed)
Subjective: Eric Stephens is a 39 y.o. male patient seen today painful mycotic nails b/l that are difficult to trim. Pain interferes with ambulation. Aggravating factors include wearing enclosed shoe gear. Pain is relieved with periodic professional debridement  Patient Active Problem List   Diagnosis Date Noted  . HLD (hyperlipidemia) 07/23/2019  . Ear pain 04/08/2019  . Rash 01/10/2019  . Screening for hyperlipidemia 01/10/2019  . Need for Tdap vaccination 05/02/2018  . Need for immunization against influenza 05/02/2018  . Syphilis 11/20/2017  . Onychomycosis of toenail 11/01/2017  . Upper respiratory tract infection 07/03/2017  . Asthma 06/08/2017  . Rib pain on right side 03/09/2017  . Bilateral lower extremity edema 02/06/2017  . Obesity (BMI 30-39.9) 01/11/2017  . Anxiety 01/11/2017  . Insomnia 01/11/2017  . Neurosyphilis 01/08/2017  . 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 Catatonia 07/04/2016  . Schizophrenia (HCC) 02/04/2016    Current Outpatient Medications on File Prior to Visit  Medication Sig Dispense Refill  . betamethasone valerate ointment (VALISONE) 0.1 % APPLY 1 APPLICATION TOPICALLY 2 (TWO) TIMES DAILY. TO THE RASH ON THE BACK. 15 g 0  . brimonidine (ALPHAGAN) 0.2 % ophthalmic solution PLACE 1 DROP INTO RIGHT EYE TWICE DAILY    . cetirizine (ZYRTEC) 10 MG tablet TAKE 1 TABLET BY MOUTH EVERY DAY AS NEEDED FOR ALLERGY 90 tablet 1  . COMBIGAN 0.2-0.5 % ophthalmic solution INSTILL 1 DROP INTO BOTH EYES TWICE A DAY    . [employer] SALINE NOSE SPRAY 0.65 % nasal spray PLACE 1 SPRAY INTO BOTH NOSTRILS AS NEEDED FOR CONGESTION. 30 mL 0  . cyclobenzaprine (FLEXERIL) 10 MG tablet Take 1 tablet (10 mg total) by mouth 2 (two) times daily as needed for muscle spasms. 20 tablet 0  . diphenhydrAMINE (BENADRYL) 25 mg capsule Inject into the vein.    [name] gabapentin (NEURONTIN) 800 MG tablet TAKE 1 TABLET TWICE A DAY FOR ANXIETY AND IRRITABLITY     . Methylfol-Methylcob-Acetylcyst (METAFOLBIC PLUS) 6-2-600 MG TABS Take by mouth.    . polyethylene glycol powder (GLYCOLAX/MIRALAX) 17 GM/SCOOP powder Take by mouth.    [name] PROVENTIL HFA 108 (90 Base) MCG/ACT inhaler TAKE 2 PUFFS BY MOUTH EVERY 6 HOURS AS NEEDED FOR WHEEZE OR SHORTNESS OF BREATH 6.7 Inhaler 1  . Pyridoxine HCl 200 MG TBCR Take by mouth.    . vitamin E 180 MG (400 UNITS) capsule Take by mouth.    . zolpidem (AMBIEN) 10 MG tablet Take 10 mg by mouth at bedtime as needed.     No current facility-administered medications on file prior to visit.    No Known Allergies  Objective: Physical Exam  General: Eric Stephens is a pleasant 44 y.o.  AA male, WD, WN in NAD. AAO x 3.   Vascular:  Neurovascular status unchanged b/l. Capillary fill time to digits <3 seconds b/l. Palpable DP pulses b/l. Palpable PT pulses b/l. Pedal hair present b/l. Skin temperature gradient within normal limits b/l. No edema noted b/l.  Dermatological:  Pedal skin with normal turgor, texture and tone bilaterally. No open wounds bilaterally. No interdigital macerations bilaterally. Toenails 1-5 b/l elongated, dystrophic, thickened, crumbly with subungual debris and tenderness to dorsal palpation. Hyperkeratotic lesion(s) submet head 2 left foot and submet head 2 right foot.  No erythema, no edema, no drainage, no flocculence.  Musculoskeletal:  Normal muscle strength 5/5 to all lower extremity muscle groups bilaterally. No gross bony deformities bilaterally. No pain crepitus or joint  limitation noted with ROM b/l. Hammertoes noted to the L 2nd toe and R 2nd toe.  Neurological:  Protective sensation intact 5/5 intact bilaterally with 10g monofilament b/l. Vibratory sensation intact b/l.  Assessment and Plan:  1. Pain due to onychomycosis of toenails of both feet    -Examined patient. -No new findings. No new orders. -Toenails 1-5 b/l were debrided in length and girth with sterile nail nippers and  dremel without iatrogenic bleeding.  -Patient to continue soft, supportive shoe gear daily. -Patient to report any pedal injuries to medical professional immediately. Father refuses paring of calluses. Medicaid ABN on file for 2021. Patient was given copy on previous visit. -Patient/POA to call should there be question/concern in the interim.    Return in about 3 months (around 02/11/2020) for nail trim.  Marzetta Board, DPM

## 2019-11-11 NOTE — Patient Instructions (Signed)

## 2019-11-11 NOTE — Patient Instructions (Signed)
Visit Information  Goals Addressed            This Visit's Progress   . Per Father/Legal Guardian, "He needs long term, supervised care." (pt-stated)       Current Barriers:  [name] Knowledge Barriers related to resources and support available to address needs related to Level of care concerns; placement.   Case Manager Clinical Goal(s):  [name] Over the next 90 days, patient will work with BSW to address needs related to Level of care concerns; placement . Over the next 90 days, BSW will collaborate with RN Care Manager to address care management and care coordination needs  Interventions:  . Contacted patient's father/legal guardian regarding status of potential placement at [facility].  Patient and father visited facility on 11/07/19 and would like to move forward with placement . Collaborated with provider for updated FL 2.    Patient Self Care Activities:  . Attends all scheduled provider appointments  Please see past updates related to this goal by clicking on the "Past Updates" button in the selected goal         Patient's guardian verbalizes understanding of instructions provided today.   The patient's guardian has been provided with contact information for the care management team and has been advised to call with any health related questions or concerns.    [name], BSW Embedded Care Coordination Social Worker [facility] [phone]

## 2019-11-11 NOTE — Chronic Care Management (AMB) (Signed)
  Care Management   Follow Up Note   11/11/2019 Name: Eric Stephens MRN: [id number] DOB: [birth year]  Referred by: Lanelle Bal, MD Reason for referral : Care Coordination (Placement)   Eric Stephens is a 39 y.o. year old male who is a primary care patient of Lanelle Bal, MD. The care management team was consulted for assistance with care management and care coordination needs.    Review of patient status, including review of consultants reports, relevant laboratory and other test results, and collaboration with appropriate care team members and the patient's provider was performed as part of comprehensive patient evaluation and provision of chronic care management services.    SDOH (Social Determinants of Health) assessments performed: No See Care Plan activities for detailed interventions related to [facility])     Advanced Directives: See Care Plan and Vynca application for related entries.   Goals Addressed            This Visit's Progress   . Per Father/Legal Guardian, "He needs long term, supervised care." (pt-stated)       Current Barriers:  [name] Knowledge Barriers related to resources and support available to address needs related to Level of care concerns; placement.   Case Manager Clinical Goal(s):  [name] Over the next 90 days, patient will work with BSW to address needs related to Level of care concerns; placement . Over the next 90 days, BSW will collaborate with RN Care Manager to address care management and care coordination needs  Interventions:  . Contacted patient's father/legal guardian regarding status of potential placement at [facility].  Patient and father visited facility on 11/07/19 and would like to move forward with placement . Collaborated with provider for updated FL 2.    Patient Self Care Activities:  . Attends all scheduled provider appointments  Please see past updates related to this goal by clicking on the "Past Updates" button in the  selected goal          The patient's guardian has been provided with contact information for the care management team and has been advised to call with any health related questions or concerns.   SIGNATURE

## 2019-11-11 NOTE — Progress Notes (Signed)
Internal Medicine Clinic Resident  I have personally reviewed this encounter including the documentation in this note and/or discussed this patient with the care management provider. I will address any urgent items identified by the care management provider and will communicate my actions to the patient's PCP. I have reviewed the patient's CCM visit with my supervising attending, Dr Butcher.  Angelyna Henderson, MD 11/11/2019    

## 2019-11-12 NOTE — Progress Notes (Signed)
Internal Medicine Clinic Attending  CCM services provided by the care management provider and their documentation were discussed with Dr. Delma Officer. We reviewed the pertinent findings, urgent action items addressed by the resident and non-urgent items to be addressed by the PCP.  I agree with the assessment, diagnosis, and plan of care documented in the CCM and resident's note. I signed the FL2.  Blanch Media, MD 11/12/2019

## 2019-11-13 ENCOUNTER — Ambulatory Visit: Payer: Medicaid Other

## 2019-11-13 DIAGNOSIS — F0281 Dementia in other diseases classified elsewhere with behavioral disturbance: Secondary | ICD-10-CM

## 2019-11-13 DIAGNOSIS — A523 Neurosyphilis, unspecified: Secondary | ICD-10-CM

## 2019-11-13 DIAGNOSIS — F02818 Dementia in other diseases classified elsewhere, unspecified severity, with other behavioral disturbance: Secondary | ICD-10-CM

## 2019-11-13 NOTE — Progress Notes (Signed)
Internal Medicine Clinic Attending  CCM services provided by the care management provider and their documentation were discussed with Dr. Chundi. We reviewed the pertinent findings, urgent action items addressed by the resident and non-urgent items to be addressed by the PCP.  I agree with the assessment, diagnosis, and plan of care documented in the CCM and resident's note.  Breuna Loveall, MD 11/13/2019  

## 2019-11-13 NOTE — Progress Notes (Signed)
Internal Medicine Clinic Resident  I have personally reviewed this encounter including the documentation in this note and/or discussed this patient with the care management provider. I will address any urgent items identified by the care management provider and will communicate my actions to the patient's PCP. I have reviewed the patient's CCM visit with my supervising attending, Dr Narendra.  Dekendrick Uzelac, MD 11/13/2019    

## 2019-11-13 NOTE — Chronic Care Management (AMB) (Signed)
  Care Management   Follow Up Note   11/13/2019 Name: MARWAN LIPE MRN: [id number] DOB: [birth year]  Referred by: Lanelle Bal, MD Reason for referral : Care Coordination (Placement)   CLELAND SIMKINS is a 39 y.o. year old male who is a primary care patient of Lanelle Bal, MD. The care management team was consulted for assistance with care management and care coordination needs.    Review of patient status, including review of consultants reports, relevant laboratory and other test results, and collaboration with appropriate care team members and the patient's provider was performed as part of comprehensive patient evaluation and provision of chronic care management services.    SDOH (Social Determinants of Health) assessments performed: No See Care Plan activities for detailed interventions related to [facility])     Advanced Directives: See Care Plan and Vynca application for related entries.   Goals Addressed            This Visit's Progress   . Per Father/Legal Guardian, "He needs long term, supervised care." (pt-stated)       Current Barriers:  [name] Knowledge Barriers related to resources and support available to address needs related to Level of care concerns; placement.   Case Manager Clinical Goal(s):  [name] Over the next 90 days, patient will work with BSW to address needs related to Level of care concerns; placement . Over the next 90 days, BSW will collaborate with RN Care Manager to address care management and care coordination needs  Interventions:  . Completed FL 2 sent to [name] with [facility]. [name] Contacted [name] to inform her that [facility] 2 was sent . Contacted patient's father/legal guardian to inform that FL 2 was sent to [name].  Reminded father that [name] still needs COVID 19 vaccination card and patient needs COVID test within 14 days of admission   Patient Self Care Activities:  . Attends all scheduled provider  appointments  Please see past updates related to this goal by clicking on the "Past Updates" button in the selected goal          The patient's father/legal guardian has been provided with contact information for the care management team and has been advised to call with any questions or concerns.    [name], BSW Embedded Care Coordination Social Worker [facility] [phone]

## 2019-11-13 NOTE — Patient Instructions (Signed)
Visit Information  Goals Addressed            This Visit's Progress   . Per Father/Legal Guardian, "He needs long term, supervised care." (pt-stated)       Current Barriers:  [name] Knowledge Barriers related to resources and support available to address needs related to Level of care concerns; placement.   Case Manager Clinical Goal(s):  [name] Over the next 90 days, patient will work with BSW to address needs related to Level of care concerns; placement . Over the next 90 days, BSW will collaborate with RN Care Manager to address care management and care coordination needs  Interventions:  . Completed FL 2 sent to [name] with [facility]. [name] Contacted [name] to inform her that [facility] 2 was sent . Contacted patient's father/legal guardian to inform that FL 2 was sent to [name].  Reminded father that [name] still needs COVID 19 vaccination card and patient needs COVID test within 14 days of admission   Patient Self Care Activities:  . Attends all scheduled provider appointments  Please see past updates related to this goal by clicking on the "Past Updates" button in the selected goal         Patient's father/legal guardian verbalizes understanding of instructions provided today.   The patient's father/guardian has been provided with contact information for the care management team and has been advised to call with any  questions or concerns.    [name], BSW Embedded Care Coordination Social Worker [facility] [phone]

## 2019-11-18 ENCOUNTER — Ambulatory Visit (INDEPENDENT_AMBULATORY_CARE_PROVIDER_SITE_OTHER): Payer: Medicaid Other | Admitting: *Deleted

## 2019-11-18 ENCOUNTER — Ambulatory Visit: Payer: Medicaid Other

## 2019-11-18 DIAGNOSIS — Z111 Encounter for screening for respiratory tuberculosis: Secondary | ICD-10-CM | POA: Diagnosis not present

## 2019-11-24 ENCOUNTER — Ambulatory Visit (INDEPENDENT_AMBULATORY_CARE_PROVIDER_SITE_OTHER): Payer: Medicaid Other | Admitting: *Deleted

## 2019-11-24 DIAGNOSIS — Z111 Encounter for screening for respiratory tuberculosis: Secondary | ICD-10-CM | POA: Diagnosis present

## 2019-11-24 NOTE — Progress Notes (Signed)
    Patient presents with father for PPD placement for housing States he received PPD test last week but was unable to return for reading  Denies previous positive TB test  Denies known exposure to TB   Tuberculin skin test applied to left ventral forearm.  Patient aware that he needs to return in 48-72 hours for PPD reading   L. Kjirsten Bloodgood, BSN, RN-BC

## 2019-11-26 ENCOUNTER — Ambulatory Visit: Payer: Medicaid Other | Admitting: *Deleted

## 2019-11-26 DIAGNOSIS — Z111 Encounter for screening for respiratory tuberculosis: Secondary | ICD-10-CM

## 2019-11-26 LAB — TB SKIN TEST
Induration: 0 mm
TB Skin Test: NEGATIVE

## 2019-11-26 NOTE — Progress Notes (Signed)
   Patient presents with father for PPD reading  Result: 0 mm induration. Interpretation: Negative Allergic reaction: no  Letter with result given to father L. Letizia Hook, BSN, RN-BC

## 2019-11-27 ENCOUNTER — Ambulatory Visit: Payer: Medicaid Other

## 2019-11-27 DIAGNOSIS — F0281 Dementia in other diseases classified elsewhere with behavioral disturbance: Secondary | ICD-10-CM

## 2019-11-27 DIAGNOSIS — A523 Neurosyphilis, unspecified: Secondary | ICD-10-CM

## 2019-11-27 DIAGNOSIS — F02818 Dementia in other diseases classified elsewhere, unspecified severity, with other behavioral disturbance: Secondary | ICD-10-CM

## 2019-11-27 NOTE — Chronic Care Management (AMB) (Signed)
  Care Management   Follow Up Note   11/27/2019 Name: Eric Stephens MRN: [id number] DOB: 04-13-1981  Referred by: Lanelle Bal, MD Reason for referral : Care Coordination (Placement)   Eric Stephens is a 39 y.o. year old male who is a primary care patient of Lanelle Bal, MD. The care management team was consulted for assistance with care management and care coordination needs.    Review of patient status, including review of consultants reports, relevant laboratory and other test results, and collaboration with appropriate care team members and the patient's provider was performed as part of comprehensive patient evaluation and provision of chronic care management services.    SDOH (Social Determinants of Health) assessments performed: No See Care Plan activities for detailed interventions related to [facility])     Advanced Directives: See Care Plan and Vynca application for related entries.   Goals Addressed              This Visit's Progress   .  Per Father/Legal Guardian, "He needs long term, supervised care." (pt-stated)        Current Barriers:  [name] Knowledge Barriers related to resources and support available to address needs related to Level of care concerns; placement.   Case Manager Clinical Goal(s):  [name] Over the next 90 days, patient will work with BSW to address needs related to Level of care concerns; placement . Over the next 90 days, BSW will collaborate with RN Care Manager to address care management and care coordination needs  Interventions:  . Contacted father/legal guardian regarding status of placement at [facility].  Patient is supposed to transition on 12/02/19  Patient Self Care Activities:  . Attends all scheduled provider appointments  Please see past updates related to this goal by clicking on the "Past Updates" button in the selected goal          The patient's guardian has been provided with contact information for the care  management team and has been advised to call with any health related questions or concerns.      [name], BSW Embedded Care Coordination Social Worker [facility] [phone]

## 2019-11-27 NOTE — Patient Instructions (Signed)
Visit Information  Goals Addressed              This Visit's Progress   .  Per Father/Legal Guardian, "He needs long term, supervised care." (pt-stated)        Current Barriers:  [name] Knowledge Barriers related to resources and support available to address needs related to Level of care concerns; placement.   Case Manager Clinical Goal(s):  [name] Over the next 90 days, patient will work with BSW to address needs related to Level of care concerns; placement . Over the next 90 days, BSW will collaborate with RN Care Manager to address care management and care coordination needs  Interventions:  . Contacted father/legal guardian regarding status of placement at [facility].  Patient is supposed to transition on 12/02/19  Patient Self Care Activities:  . Attends all scheduled provider appointments  Please see past updates related to this goal by clicking on the "Past Updates" button in the selected goal           The patient's guardian has been provided with contact information for the care management team and has been advised to call with any health related questions or concerns.    [name], BSW Embedded Care Coordination Social Worker [facility] [phone]

## 2019-11-28 NOTE — Progress Notes (Signed)
Internal Medicine Clinic Resident  I have personally reviewed this encounter including the documentation in this note and/or discussed this patient with the care management provider. I will address any urgent items identified by the care management provider and will communicate my actions to the patient's PCP. I have reviewed the patient's CCM visit with my supervising attending, Dr Hoffman.  Sofiya Ezelle M Amous Crewe, MD 11/28/2019    

## 2019-12-02 ENCOUNTER — Ambulatory Visit: Payer: Self-pay

## 2019-12-02 DIAGNOSIS — F0281 Dementia in other diseases classified elsewhere with behavioral disturbance: Secondary | ICD-10-CM

## 2019-12-02 DIAGNOSIS — A523 Neurosyphilis, unspecified: Secondary | ICD-10-CM

## 2019-12-02 DIAGNOSIS — F02818 Dementia in other diseases classified elsewhere, unspecified severity, with other behavioral disturbance: Secondary | ICD-10-CM

## 2019-12-02 NOTE — Progress Notes (Signed)
Internal Medicine Clinic Resident  I have personally reviewed this encounter including the documentation in this note and/or discussed this patient with the care management provider. I will address any urgent items identified by the care management provider and will communicate my actions to the patient's PCP. I have reviewed the patient's CCM visit with my supervising attending, Dr Guilloud.  Eric Constantino, MD 12/02/2019    

## 2019-12-02 NOTE — Patient Instructions (Signed)
Visit Information  Goals Addressed              This Visit's Progress     Per Father/Legal Guardian, "He needs long term, supervised care." (pt-stated)        Current Barriers:   Knowledge Barriers related to resources and support available to address needs related to Level of care concerns; placement.   Case Manager Clinical Goal(s):   Over the next 90 days, patient will work with BSW to address needs related to Level of care concerns; placement  Over the next 90 days, BSW will collaborate with RN Care Manager to address care management and care coordination needs  Interventions:   Received call from father/legal guardian stating that [facility] has not received updated FL 2.  Informed him that it was sent on 11/13/19  Sent completed FL 2 via fax to [name] @ [phone]  Patient Self Care Activities:   Attends all scheduled provider appointments  Please see past updates related to this goal by clicking on the "Past Updates" button in the selected goal           The patient has been provided with contact information for the care management team and has been advised to call with any health related questions or concerns.     [name], BSW Embedded Care Coordination Social Worker [facility] [phone]

## 2019-12-02 NOTE — Chronic Care Management (AMB) (Signed)
°  Care Management   Follow Up Note   12/02/2019 Name: Eric Stephens MRN: [id number] DOB: February 11, 1981  Referred by: Lanelle Bal, MD Reason for referral : Care Coordination (Placement)   Eric Stephens is a 39 y.o. year old male who is a primary care patient of Lanelle Bal, MD. The care management team was consulted for assistance with care management and care coordination needs.    Review of patient status, including review of consultants reports, relevant laboratory and other test results, and collaboration with appropriate care team members and the patient's provider was performed as part of comprehensive patient evaluation and provision of chronic care management services.    SDOH (Social Determinants of Health) assessments performed: No See Care Plan activities for detailed interventions related to [facility])     Advanced Directives: See Care Plan and Vynca application for related entries.   Goals Addressed              This Visit's Progress     Per Father/Legal Guardian, "He needs long term, supervised care." (pt-stated)        Current Barriers:   Knowledge Barriers related to resources and support available to address needs related to Level of care concerns; placement.   Case Manager Clinical Goal(s):   Over the next 90 days, patient will work with BSW to address needs related to Level of care concerns; placement  Over the next 90 days, BSW will collaborate with RN Care Manager to address care management and care coordination needs  Interventions:   Received call from father/legal guardian stating that [facility] has not received updated FL 2.  Informed him that it was sent on 11/13/19  Sent completed FL 2 via fax to [name] @ [phone]  Patient Self Care Activities:   Attends all scheduled provider appointments  Please see past updates related to this goal by clicking on the "Past Updates" button in the selected goal          The patient  has been provided with contact information for the care management team and has been advised to call with any health related questions or concerns.      [name], BSW Embedded Care Coordination Social Worker [facility] [phone]

## 2019-12-03 NOTE — Progress Notes (Signed)
Internal Medicine Clinic Attending  CCM services provided by the care management provider and their documentation were discussed with Dr. Masoudi. We reviewed the pertinent findings, urgent action items addressed by the resident and non-urgent items to be addressed by the PCP.  I agree with the assessment, diagnosis, and plan of care documented in the CCM and resident's note.  Trigo Winterbottom, MD 12/03/2019 

## 2019-12-05 NOTE — Progress Notes (Signed)
Internal Medicine Clinic Attending  CCM services provided by the care management provider and their documentation were discussed with Dr. Krienke. We reviewed the pertinent findings, urgent action items addressed by the resident and non-urgent items to be addressed by the PCP.  I agree with the assessment, diagnosis, and plan of care documented in the CCM and resident's note.  Jossue Rubenstein C Liani Caris, DO 12/05/2019  

## 2019-12-09 ENCOUNTER — Ambulatory Visit: Payer: Self-pay

## 2019-12-09 DIAGNOSIS — A523 Neurosyphilis, unspecified: Secondary | ICD-10-CM

## 2019-12-09 DIAGNOSIS — F0281 Dementia in other diseases classified elsewhere with behavioral disturbance: Secondary | ICD-10-CM

## 2019-12-09 DIAGNOSIS — F02818 Dementia in other diseases classified elsewhere, unspecified severity, with other behavioral disturbance: Secondary | ICD-10-CM

## 2019-12-09 NOTE — Progress Notes (Signed)
Internal Medicine Clinic Resident  I have personally reviewed this encounter including the documentation in this note and/or discussed this patient with the care management provider. I will address any urgent items identified by the care management provider and will communicate my actions to the patient's PCP. I have reviewed the patient's CCM visit with my supervising attending, Dr Guilloud.  Gentry Pilson, MD 12/09/2019    

## 2019-12-09 NOTE — Chronic Care Management (AMB) (Signed)
  Care Management   Follow Up Note   12/09/2019 Name: Eric Stephens MRN: [id number] DOB: [birth year]  Referred by: Lanelle Bal, MD Reason for referral : Care Coordination (Placement)   [name] is a 39 y.o. year old male who is a primary care patient of Lanelle Bal, MD. The care management team was consulted for assistance with care management and care coordination needs.    Review of patient status, including review of consultants reports, relevant laboratory and other test results, and collaboration with appropriate care team members and the patient's provider was performed as part of comprehensive patient evaluation and provision of chronic care management services.    SDOH (Social Determinants of Health) assessments performed: No See Care Plan activities for detailed interventions related to [facility])     Advanced Directives: See Care Plan and Vynca application for related entries.   Goals Addressed              This Visit's Progress   .  Per Father/Legal Guardian, "He needs long term, supervised care." (pt-stated)        Current Barriers:  [name] Knowledge Barriers related to resources and support available to address needs related to Level of care concerns; placement.   Patient moved into [facility] on 12/02/19.  Received call from father/legal guardian today stating patient has returned back to his home.  Father reports that he was contacted by Director of ALF, [name], on 12/08/19 because patient was "acting out" due to not wanting to be in the facility and wanting to return home.  Per father, [name] recommended that patient be placed at [facility].    Case Manager Clinical Goal(s):  [name] Over the next 90 days, patient will work with BSW to address needs related to Level of care concerns; placement . Over the next 90 days, BSW will collaborate with RN Care Manager to address care management and care coordination  needs  Interventions:  . Informed father that [facility] and other such facilities are for those that are experiencing mental health crisis.  Informed him that these types of facilities are not for long term placement . Reminded father about the various types of long term placement.  . Reminded father of the many facilities that were contacted that would not accept patient due to his age and/or not accepting Medicaid . Inquired if patient has been recently evaluated by a neurologist; father was not certain . Recommended that appointment be scheduled with primary care provider to discuss further evaluation, possibly referral to neurologist, to identify most appropriate level of care for patient . Informed father that CCM BSW will continue to assist with location of appropriate placement but age of patient may continue to be a barrier for particular type of placement family is seeking   . Patient Self Care Activities:  . Attends all scheduled provider appointments  Please see past updates related to this goal by clicking on the "Past Updates" button in the selected goal          Will plan to meet with patient and father/guardian when clinic appointment is scheduled.       [name], BSW Embedded Care Coordination Social Worker [facility] [phone]

## 2019-12-09 NOTE — Patient Instructions (Signed)
Visit Information  Goals Addressed              This Visit's Progress   .  Per Father/Legal Guardian, "He needs long term, supervised care." (pt-stated)        Current Barriers:  [name] Knowledge Barriers related to resources and support available to address needs related to Level of care concerns; placement.   Patient moved into [facility] on 12/02/19.  Received call from father/legal guardian today stating patient has returned back to his home.  Father reports that he was contacted by Director of ALF, [name], on 12/08/19 because patient was "acting out" due to not wanting to be in the facility and wanting to return home.  Per father, [name] recommended that patient be placed at [facility].    Case Manager Clinical Goal(s):  [name] Over the next 90 days, patient will work with BSW to address needs related to Level of care concerns; placement . Over the next 90 days, BSW will collaborate with RN Care Manager to address care management and care coordination needs  Interventions:  . Informed father that [facility] and other such facilities are for those that are experiencing mental health crisis.  Informed him that these types of facilities are not for long term placement . Reminded father about the various types of long term placement.  . Reminded father of the many facilities that were contacted that would not accept patient due to his age and/or not accepting Medicaid . Inquired if patient has been recently evaluated by a neurologist; father was not certain . Recommended that appointment be scheduled with primary care provider to discuss further evaluation, possibly referral to neurologist, to identify most appropriate level of care for patient . Informed father that CCM BSW will continue to assist with location of appropriate placement but age of patient may continue to be a barrier for particular type of placement family is seeking   . Patient Self Care  Activities:  . Attends all scheduled provider appointments  Please see past updates related to this goal by clicking on the "Past Updates" button in the selected goal         Patient's father/guardian verbalizes understanding of instructions provided today.   Will plan to meet with patient and father/guardian when clinic appointment is scheduled.        [name], BSW Embedded Care Coordination Social Worker [facility] [phone]

## 2019-12-12 NOTE — Progress Notes (Signed)
Internal Medicine Clinic Attending  CCM services provided by the care management provider and their documentation were discussed with Dr. Basaraba. We reviewed the pertinent findings, urgent action items addressed by the resident and non-urgent items to be addressed by the PCP.  I agree with the assessment, diagnosis, and plan of care documented in the CCM and resident's note.  Shawnae Leiva, MD 12/12/2019 

## 2019-12-23 ENCOUNTER — Ambulatory Visit: Payer: Self-pay

## 2019-12-23 DIAGNOSIS — F0281 Dementia in other diseases classified elsewhere with behavioral disturbance: Secondary | ICD-10-CM

## 2019-12-23 DIAGNOSIS — A523 Neurosyphilis, unspecified: Secondary | ICD-10-CM

## 2019-12-23 DIAGNOSIS — F02818 Dementia in other diseases classified elsewhere, unspecified severity, with other behavioral disturbance: Secondary | ICD-10-CM

## 2019-12-23 NOTE — Chronic Care Management (AMB) (Signed)
°  Care Management   Follow Up Note   12/23/2019 Name: Eric Stephens MRN: [id number] DOB: 13-Jan-1981  Referred by: Alphonzo Severance, MD Reason for referral : Care Coordination (Placement)   Eric Stephens is a 39 y.o. year old male who is a primary care patient of Alphonzo Severance, MD. The care management team was consulted for assistance with care management and care coordination needs.    Review of patient status, including review of consultants reports, relevant laboratory and other test results, and collaboration with appropriate care team members and the patient's provider was performed as part of comprehensive patient evaluation and provision of chronic care management services.    SDOH (Social Determinants of Health) assessments performed: No See Care Plan activities for detailed interventions related to [facility])     Advanced Directives: See Care Plan and Vynca application for related entries.   Goals Addressed              This Visit's Progress     Per Father/Legal Guardian, "He needs long term, supervised care." (pt-stated)        Current Barriers:   Knowledge Barriers related to resources and support available to address needs related to Level of care concerns; placement.   Patient moved into [facility] on 12/02/19.  Received call from father/legal guardian today stating patient has returned back to his home.  Father reports that he was contacted by Director of ALF, [name], on 12/08/19 because patient was "acting out" due to not wanting to be in the facility and wanting to return home.  Father picked up patient that day and he returned home.   Case Manager Clinical Goal(s):   Over the next 90 days, patient will work with BSW to address needs related to Level of care concerns; placement  Over the next 90 days, BSW will collaborate with RN Care Manager to address care management and care coordination needs  Interventions:   Collaborated with Social Work colleagues for  suggestions of placemen options as many options have been exhausted  Researched [employer] on [employer] site.    Researched [facility] on [place] [facility] web site  Printed information about both facilities to share with patient/guardian at office visit tomorrow.  Printed Admission Application for both facilities to share with patient/guardian at office visit tomorrow.     Patient Self Care Activities:   Attends all scheduled provider appointments  Please see past updates related to this goal by clicking on the "Past Updates" button in the selected goal          Face to Face appointment with care management team member scheduled for: 12/24/19     Eric Stephens, BSW Embedded Care Coordination Social Worker [facility] [phone]

## 2019-12-23 NOTE — Patient Instructions (Signed)
Visit Information  Goals Addressed              This Visit's Progress   .  Per Father/Legal Guardian, "He needs long term, supervised care." (pt-stated)        Current Barriers:  [name] Knowledge Barriers related to resources and support available to address needs related to Level of care concerns; placement.   Patient moved into [facility] on 12/02/19.  Received call from father/legal guardian today stating patient has returned back to his home.  Father reports that he was contacted by Director of ALF, [name], on 12/08/19 because patient was "acting out" due to not wanting to be in the facility and wanting to return home.  Father picked up patient that day and he returned home.   Case Manager Clinical Goal(s):  [name] Over the next 90 days, patient will work with BSW to address needs related to Level of care concerns; placement . Over the next 90 days, BSW will collaborate with RN Care Manager to address care management and care coordination needs  Interventions:  . Collaborated with Social Work colleagues for suggestions of placemen options as many options have been exhausted . Researched [facility] on [place] [employer] site.   [name] Researched [facility] on [place] [employer] site . Printed information about both facilities to share with patient/guardian at office visit tomorrow. . Printed Admission Application for both facilities to share with patient/guardian at office visit tomorrow.    . Patient Self Care Activities:  . Attends all scheduled provider appointments  Please see past updates related to this goal by clicking on the "Past Updates" button in the selected goal           Face to Face appointment with care management team member scheduled for: 12/24/19     [name], BSW Embedded Care Coordination Social Worker [facility] [phone]

## 2019-12-24 ENCOUNTER — Ambulatory Visit: Payer: Medicaid Other | Admitting: Internal Medicine

## 2019-12-24 ENCOUNTER — Encounter: Payer: Self-pay | Admitting: Internal Medicine

## 2019-12-24 ENCOUNTER — Ambulatory Visit: Payer: Self-pay

## 2019-12-24 ENCOUNTER — Ambulatory Visit: Payer: Medicaid Other

## 2019-12-24 ENCOUNTER — Other Ambulatory Visit: Payer: Self-pay

## 2019-12-24 DIAGNOSIS — F02818 Dementia in other diseases classified elsewhere, unspecified severity, with other behavioral disturbance: Secondary | ICD-10-CM

## 2019-12-24 DIAGNOSIS — A523 Neurosyphilis, unspecified: Secondary | ICD-10-CM

## 2019-12-24 DIAGNOSIS — F0281 Dementia in other diseases classified elsewhere with behavioral disturbance: Secondary | ICD-10-CM

## 2019-12-24 NOTE — Chronic Care Management (AMB) (Signed)
  Care Management   Social Work Note  12/24/2019 Name: DESHAUN WEISINGER MRN: [id number] DOB: 11-10-1980  TRIPP GOINS is a 39 y.o. year old male who sees Alphonzo Severance, MD for primary care. The Care Management team was consulted for assistance with Level of Care Concerns.   During last phone outreach to patient's father/legal guardian, it was decided that office visit would be scheduled with primary care provider AND BSW for purpose of:  Completing new FL2, discussing barriers to long term placement, discussing possible referral for comprehensive clinical assessment or neurology.    Patient/father were advised by nursing staff to meet with BSW but left without being seen.   [name], BSW Embedded Care Coordination Social Worker [facility] [phone]

## 2019-12-25 ENCOUNTER — Encounter: Payer: Self-pay | Admitting: Internal Medicine

## 2019-12-25 ENCOUNTER — Ambulatory Visit: Payer: Self-pay

## 2019-12-25 DIAGNOSIS — F0281 Dementia in other diseases classified elsewhere with behavioral disturbance: Secondary | ICD-10-CM

## 2019-12-25 DIAGNOSIS — E785 Hyperlipidemia, unspecified: Secondary | ICD-10-CM

## 2019-12-25 DIAGNOSIS — A523 Neurosyphilis, unspecified: Secondary | ICD-10-CM

## 2019-12-25 DIAGNOSIS — F02818 Dementia in other diseases classified elsewhere, unspecified severity, with other behavioral disturbance: Secondary | ICD-10-CM

## 2019-12-25 NOTE — Patient Instructions (Signed)
Visit Information  Goals Addressed              This Visit's Progress   .  Per Father/Legal Guardian, "He needs long term, supervised care." (pt-stated)        Current Barriers:  [name] Knowledge Barriers related to resources and support available to address needs related to Level of care concerns; placement.   Patient moved into [facility] on 12/02/19.  Patient only remained at ALF until 12/08/19 because father was contacted by Director stating that patient was "acting out" because he wanted to go home.  Father picked patient up that day and took him back home.   Case Manager Clinical Goal(s):  [name] Over the next 180 days, patient will work with BSW to address needs related to Level of care concerns; placement . Over the next 180 days, BSW will collaborate with RN Care Manager to address care management and care coordination needs  Interventions:  . Talked with father about [facility] and [facility] as possible options for placement; facilities are too far away . Revisited [facility] as possible option for placement but father does not feel patient will receive appropriate level of supervision at this type of facility.   . Reminded father that memory care facilities previously contacted either do not accept Medicaid or did not feel that patient would be appropriate due to age . Agreed to contact facilities again due to lack of options for placement . Informed father that there many not be options for placement at memory care facility in [place] . Encouraged father to consider placement options in [place], [place], [place], or [facility].  . Left messages with Admissions Coordinators for the following facilities:  [facility], [facility], [place]  . Patient Self Care Activities:  . Attends all scheduled provider appointments  Please see past updates related to this goal by clicking on the "Past Updates"  button in the selected goal         Patient's caregiver verbalizes understanding of instructions provided today.   The patient's caregiver has been provided with contact information for the care management team and has been advised to call with any health related questions or concerns.    [name], BSW Embedded Care Coordination Social Worker [facility] [phone]

## 2019-12-25 NOTE — Progress Notes (Signed)
Acute Office Visit  Subjective:    Patient ID: Eric Stephens, male    DOB: [birth year], 39 y.o.   MRN: [id number]  Chief Complaint  Patient presents with  . Follow-up    Father here w/ patient, wants to talk about in-patient placement/facility; CCM aware and appt folllowing MD today    HPI Patient presents with his father today to discuss facility placement. He has no acute medical concerns. I explained that this was typically done as a collective effort between PCP and CCM. I reiterated that fact that he is scheduled for an appointment with Amber this morning. Despite being told this both by myself and clinic staff, he proceeded to leave without staying to meet with CCM.   Past Medical History:  Diagnosis Date  . Asthma   . Syphilis     Past Surgical History:  Procedure Laterality Date  . WRIST SURGERY      Family History  Problem Relation Age of Onset  . Hypertension Father   . Hypertension Mother   . Arthritis Mother     Social History   Socioeconomic History  . Marital status: Single    Spouse name: Not on file  . Number of children: Not on file  . Years of education: Not on file  . Highest education level: Not on file  Occupational History  . Not on file  Tobacco Use  . Smoking status: Current Some Day Smoker    Packs/day: 0.50    Types: Cigarettes  . Smokeless tobacco: Never Used  Vaping Use  . Vaping Use: Never used  Substance and Sexual Activity  . Alcohol use: No  . Drug use: No  . Sexual activity: Yes  Other Topics Concern  . Not on file  Social History Narrative  . Not on file   Social Determinants of Health   Financial Resource Strain:   . Difficulty of Paying Living Expenses:   Food Insecurity: No Food Insecurity  . Worried About Programme researcher, broadcasting/film/video in the Last Year: Never true  . Ran Out of Food in the Last Year: Never true  Transportation Needs:   . Lack of Transportation (Medical):   [name] Lack of Transportation (Non-Medical):     Physical Activity:   . Days of Exercise per Week:   . Minutes of Exercise per Session:   Stress:   . Feeling of Stress :   Social Connections:   . Frequency of Communication with Friends and Family:   . Frequency of Social Gatherings with Friends and Family:   . Attends Religious Services:   . Active Member of Clubs or Organizations:   . Attends Banker Meetings:   [name] Marital Status:   Intimate Partner Violence:   . Fear of Current or Ex-Partner:   . Emotionally Abused:   [name] Physically Abused:   . Sexually Abused:     Outpatient Medications Prior to Visit  Medication Sig Dispense Refill  . betamethasone valerate ointment (VALISONE) 0.1 % APPLY 1 APPLICATION TOPICALLY 2 (TWO) TIMES DAILY. TO THE RASH ON THE BACK. 15 g 0  . brimonidine (ALPHAGAN) 0.2 % ophthalmic solution PLACE 1 DROP INTO RIGHT EYE TWICE DAILY    . cetirizine (ZYRTEC) 10 MG tablet TAKE 1 TABLET BY MOUTH EVERY DAY AS NEEDED FOR ALLERGY 90 tablet 1  . COMBIGAN 0.2-0.5 % ophthalmic solution INSTILL 1 DROP INTO BOTH EYES TWICE A DAY    . [employer] SALINE NOSE SPRAY 0.65 % nasal spray  PLACE 1 SPRAY INTO BOTH NOSTRILS AS NEEDED FOR CONGESTION. 30 mL 0  . cyclobenzaprine (FLEXERIL) 10 MG tablet Take 1 tablet (10 mg total) by mouth 2 (two) times daily as needed for muscle spasms. 20 tablet 0  . diphenhydrAMINE (BENADRYL) 25 mg capsule Inject into the vein.    [name] gabapentin (NEURONTIN) 800 MG tablet TAKE 1 TABLET TWICE A DAY FOR ANXIETY AND IRRITABLITY    . Methylfol-Methylcob-Acetylcyst (METAFOLBIC PLUS) 6-2-600 MG TABS Take by mouth.    . polyethylene glycol powder (GLYCOLAX/MIRALAX) 17 GM/SCOOP powder Take by mouth.    [name] PROVENTIL HFA 108 (90 Base) MCG/ACT inhaler TAKE 2 PUFFS BY MOUTH EVERY 6 HOURS AS NEEDED FOR WHEEZE OR SHORTNESS OF BREATH 6.7 Inhaler 1  . Pyridoxine HCl 200 MG TBCR Take by mouth.    . vitamin E 180 MG (400 UNITS) capsule Take by mouth.    . zolpidem (AMBIEN) 10 MG tablet Take 10 mg by mouth at  bedtime as needed.     No facility-administered medications prior to visit.    No Known Allergies  Review of Systems  Constitutional: Negative for activity change and fever.  Neurological: Negative for seizures, syncope and speech difficulty.  Psychiatric/Behavioral: Positive for behavioral problems.       Objective:    Physical Exam Constitutional:      General: He is not in acute distress.    Appearance: Normal appearance.  Neurological:     General: No focal deficit present.     Mental Status: He is alert. Mental status is at baseline.     BP 119/68 (BP Location: Right Arm, Patient Position: Sitting, Cuff Size: Normal)   Pulse 74   Temp 98.5 F (36.9 C) (Oral)   Ht 5\' 11"  (1.803 m)   Wt 254 lb 11.2 oz (115.5 kg)   SpO2 98%   BMI 35.52 kg/m  Wt Readings from Last 3 Encounters:  12/24/19 254 lb 11.2 oz (115.5 kg)  07/23/19 244 lb 3.2 oz (110.8 kg)  04/08/19 238 lb 14.4 oz (108.4 kg)    There are no preventive care reminders to display for this patient.  There are no preventive care reminders to display for this patient.   No results found for: TSH Lab Results  Component Value Date   WBC 8.7 03/07/2017   HGB 12.3 (L) 03/07/2017   HCT 38.1 (L) 03/07/2017   MCV 86.4 03/07/2017   PLT 277 03/07/2017   Lab Results  Component Value Date   NA 139 01/08/2019   K 4.4 01/08/2019   CO2 21 01/08/2019   GLUCOSE 93 01/08/2019   BUN 11 01/08/2019   CREATININE 0.73 (L) 01/08/2019   BILITOT 0.2 01/08/2019   ALKPHOS 69 01/08/2019   AST 17 01/08/2019   ALT 13 01/08/2019   PROT 6.4 01/08/2019   ALBUMIN 4.0 01/08/2019   CALCIUM 9.3 01/08/2019   ANIONGAP 8 03/07/2017   Lab Results  Component Value Date   CHOL 214 (H) 01/08/2019   Lab Results  Component Value Date   HDL 47 01/08/2019   Lab Results  Component Value Date   LDLCALC 151 (H) 01/08/2019   Lab Results  Component Value Date   TRIG 82 01/08/2019   Lab Results  Component Value Date   CHOLHDL  4.6 01/08/2019   Lab Results  Component Value Date   HGBA1C 5.4 01/08/2017       Assessment & Plan:   Problem List Items Addressed This Visit  Nervous and Auditory   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 He has been working closely with CCM who helped get him into an assisted living. Apparently, he only stayed 4 days before father had to come and pick him up. Father explains his son needs more supervised care, but he is aware this is quite difficult based on his age and functional status. Unfortunately he left prior to being able to meet with Amber to discuss further options. CMM will continue to follow and coordinate with PCP about any necessary forms to fill out if they are able to find a facility that will take him. He may end up requiring formal neurologic or psychiatric evaluations if placement continues to be an issue.           No orders of the defined types were placed in this encounter.    Bridget Hartshorn, DO

## 2019-12-25 NOTE — Assessment & Plan Note (Addendum)
Father brings patient in today to discuss facility placement for his son. He has been working closely with CCM who helped get him into an assisted living. Apparently, he only stayed 4 days before father had to come and pick him up. Father explains his son needs more supervised care, but he is aware this is quite difficult based on his age and functional status. Unfortunately he left prior to being able to meet with Amber to discuss further options. CMM will continue to follow and coordinate with PCP about any necessary forms to fill out if they are able to find a facility that will take him. He may end up requiring formal neurologic or psychiatric evaluations if placement continues to be an issue.

## 2019-12-25 NOTE — Chronic Care Management (AMB) (Signed)
  Care Management   Follow Up Note   12/25/2019 Name: Eric Stephens MRN: [id number] DOB: 01-19-1981  Referred by: Alphonzo Severance, MD Reason for referral : Care Coordination (Placement)   Eric Stephens is a 39 y.o. year old male who is a primary care patient of Alphonzo Severance, MD. The care management team was consulted for assistance with care management and care coordination needs.    Review of patient status, including review of consultants reports, relevant laboratory and other test results, and collaboration with appropriate care team members and the patient's provider was performed as part of comprehensive patient evaluation and provision of chronic care management services.    SDOH (Social Determinants of Health) assessments performed: No See Care Plan activities for detailed interventions related to [facility])     Advanced Directives: See Care Plan and Vynca application for related entries.   Goals Addressed              This Visit's Progress   .  Per Father/Legal Guardian, "He needs long term, supervised care." (pt-stated)        Current Barriers:  [name] Knowledge Barriers related to resources and support available to address needs related to Level of care concerns; placement.   Patient moved into [facility] on 12/02/19.  Patient only remained at ALF until 12/08/19 because father was contacted by Director stating that patient was "acting out" because he wanted to go home.  Father picked patient up that day and took him back home.   Case Manager Clinical Goal(s):  [name] Over the next 180 days, patient will work with BSW to address needs related to Level of care concerns; placement . Over the next 180 days, BSW will collaborate with RN Care Manager to address care management and care coordination needs  Interventions:  . Talked with father about [facility] and [facility] as possible options for placement; facilities are too far  away . Revisited [facility] as possible option for placement but father does not feel patient will receive appropriate level of supervision at this type of facility.   . Reminded father that memory care facilities previously contacted either do not accept Medicaid or did not feel that patient would be appropriate due to age . Agreed to contact facilities again due to lack of options for placement . Informed father that there many not be options for placement at memory care facility in [place] . Encouraged father to consider placement options in [place], [place], [place], or [facility].  . Left messages with Admissions Coordinators for the following facilities:  [facility], [facility], [facility]  . Patient Self Care Activities:  . Attends all scheduled provider appointments  Please see past updates related to this goal by clicking on the "Past Updates" button in the selected goal          The patient's guardian has been provided with contact information for the care management team and has been advised to call with any health related questions or concerns.    [name], BSW Embedded Care Coordination Social Worker [facility] [phone]

## 2019-12-26 ENCOUNTER — Telehealth: Payer: Medicaid Other

## 2019-12-30 NOTE — Progress Notes (Signed)
Internal Medicine Clinic Attending  Case discussed with Dr. Bloomfield  At the time of the visit.  We reviewed the resident's history and exam and pertinent patient test results.  I agree with the assessment, diagnosis, and plan of care documented in the resident's note.  

## 2020-01-07 ENCOUNTER — Ambulatory Visit: Payer: Self-pay

## 2020-01-07 NOTE — Chronic Care Management (AMB) (Signed)
°  Chronic Care Management   Outreach Note  01/07/2020 Name: Eric Stephens MRN: [id number] DOB: 1981-05-27  Referred by: Alphonzo Severance, MD Reason for referral : Care Coordination (Placement)   An unsuccessful telephone outreach was attempted today. The patient was referred to the case management team for assistance with care management and care coordination.   Follow Up Plan: A HIPPA compliant phone message was left for the patient's guardian providing contact information and requesting a return call.      [name], BSW Embedded Care Coordination Social Worker [facility] [phone]

## 2020-01-13 ENCOUNTER — Ambulatory Visit: Payer: Medicaid Other

## 2020-01-13 DIAGNOSIS — F0281 Dementia in other diseases classified elsewhere with behavioral disturbance: Secondary | ICD-10-CM

## 2020-01-13 DIAGNOSIS — F02818 Dementia in other diseases classified elsewhere, unspecified severity, with other behavioral disturbance: Secondary | ICD-10-CM

## 2020-01-13 NOTE — Chronic Care Management (AMB) (Signed)
  Care Management   Follow Up Note   01/13/2020 Name: Eric Stephens MRN: [id number] DOB: 1981/05/04  Referred by: Alphonzo Severance, MD Reason for referral : Care Coordination (Placement)   Eric Stephens is a 39 y.o. year old male who is a primary care patient of Alphonzo Severance, MD. The care management team was consulted for assistance with care management and care coordination needs.    Review of patient status, including review of consultants reports, relevant laboratory and other test results, and collaboration with appropriate care team members and the patient's provider was performed as part of comprehensive patient evaluation and provision of chronic care management services.    SDOH (Social Determinants of Health) assessments performed: No See Care Plan activities for detailed interventions related to [facility])     Advanced Directives: See Care Plan and Vynca application for related entries.   Goals Addressed              This Visit's Progress   .  Per Father/Legal Guardian, "He needs long term, supervised care." (pt-stated)        Current Barriers:  [name] Knowledge Barriers related to resources and support available to address needs related to Level of care concerns; placement.   Patient moved into [facility] on 12/02/19.  Patient only remained at ALF until 12/08/19 because father was contacted by Director stating that patient was "acting out" because he wanted to go home.  Father picked patient up that day and took him back home.   Case Manager Clinical Goal(s):  [name] Over the next 180 days, patient will work with BSW to address needs related to Level of care concerns; placement . Over the next 180 days, BSW will collaborate with RN Care Manager to address care management and care coordination needs  Interventions:  . Updated FL 2 completed and forwarded to provider for review/signature.    . Patient Self Care Activities:  . Attends all scheduled provider appointments  Please see  past updates related to this goal by clicking on the "Past Updates" button in the selected goal          The patient's guardian has been provided with contact information for the care management team and has been advised to call with any health/community resource related questions or concerns.     [name], BSW Embedded Care Coordination Social Worker [facility] [phone]

## 2020-01-13 NOTE — Patient Instructions (Signed)
Visit Information  Goals Addressed              This Visit's Progress     Per Father/Legal Guardian, "He needs long term, supervised care." (pt-stated)        Current Barriers:   Knowledge Barriers related to resources and support available to address needs related to Level of care concerns; placement.   Patient moved into [facility] on 12/02/19.  Patient only remained at ALF until 12/08/19 because father was contacted by Director stating that patient was "acting out" because he wanted to go home.  Father picked patient up that day and took him back home.   Case Manager Clinical Goal(s):   Over the next 180 days, patient will work with BSW to address needs related to Level of care concerns; placement  Over the next 180 days, BSW will collaborate with RN Care Manager to address care management and care coordination needs  Interventions:   Updated FL 2 completed and forwarded to provider for review/signature.     Patient Self Care Activities:   Attends all scheduled provider appointments  Please see past updates related to this goal by clicking on the "Past Updates" button in the selected goal           The patient's guardian has been provided with contact information for the care management team and has been advised to call with any health related questions or concerns.       [name], BSW Embedded Care Coordination Social Worker [facility] [phone]

## 2020-01-14 NOTE — Progress Notes (Signed)
Internal Medicine Clinic Resident  I have personally reviewed this encounter including the documentation in this note and/or discussed this patient with the care management provider. I will address any urgent items identified by the care management provider and will communicate my actions to the patient's PCP. I have reviewed the patient's CCM visit with my supervising attending, Dr Vincent.  Porfirio Bollier, MD  [facility] PGY-2 01/14/2020    

## 2020-01-14 NOTE — Progress Notes (Signed)
Internal Medicine Clinic Attending  CCM services provided by the care management provider and their documentation were discussed with Dr. Aslam. We reviewed the pertinent findings, urgent action items addressed by the resident and non-urgent items to be addressed by the PCP.  I agree with the assessment, diagnosis, and plan of care documented in the CCM and resident's note.  Teo Moede Thomas Mukund Weinreb, MD 01/14/2020  

## 2020-01-20 ENCOUNTER — Other Ambulatory Visit: Payer: Self-pay | Admitting: Student

## 2020-01-21 ENCOUNTER — Ambulatory Visit: Payer: Medicaid Other | Admitting: *Deleted

## 2020-01-21 ENCOUNTER — Ambulatory Visit: Payer: Medicaid Other

## 2020-01-21 DIAGNOSIS — E785 Hyperlipidemia, unspecified: Secondary | ICD-10-CM

## 2020-01-21 DIAGNOSIS — F02818 Dementia in other diseases classified elsewhere, unspecified severity, with other behavioral disturbance: Secondary | ICD-10-CM

## 2020-01-21 DIAGNOSIS — A523 Neurosyphilis, unspecified: Secondary | ICD-10-CM

## 2020-01-21 DIAGNOSIS — F0281 Dementia in other diseases classified elsewhere with behavioral disturbance: Secondary | ICD-10-CM

## 2020-01-21 NOTE — Chronic Care Management (AMB) (Signed)
  Care Management   Follow Up Note   01/21/2020 Name: Eric Stephens MRN: [id number] DOB: 08-Feb-1981  Referred by: Alphonzo Severance, MD Reason for referral : Care Coordination (Asthma,Dementia,HLD)   Eric Stephens is a 39 y.o. year old male who is a primary care patient of Alphonzo Severance, MD. The care management team was consulted for assistance with care management and care coordination needs.    Review of patient status, including review of consultants reports, relevant laboratory and other test results, and collaboration with appropriate care team members and the patient's provider was performed as part of comprehensive patient evaluation and provision of chronic care management services.    SDOH (Social Determinants of Health) assessments performed: No See Care Plan activities for detailed interventions related to [facility])     Advanced Directives: See Care Plan and Vynca application for related entries.   Goals Addressed              This Visit's Progress     Patient Stated   .  Statement per Father/Legal Guardian "He needs long-term, supervised care." (pt-stated)        CARE PLAN ENTRY (see longitudinal plan of care for additional care plan information)   Current Barriers:  . Chronic Disease Management support, education, and care coordination needs related to HLD, Dementia, and neurosyphilis  Case Manager Clinical Goal(s):  [name] Over the next 90 days, patient will work with BSW to address needs related to ADL IADL limitations in patient with HLD, Dementia, and neurosyphilis  Interventions:  . Collaborated with BSW to initiate plan of care to address needs related to Level of care concerns in patient with HLD, Dementia, and neurosyphilis- retrieved provider completed FL2 from BSW's mailbox and scanned and emailed to CCM [employer]  Patient Self Care Activities:  . Performs ADL's independently . Performs IADL's independently  Please see past updates related to this goal  by clicking on the "Past Updates" button in the selected goal          The care management team will reach out to the patient again over the next 30-90 days.   Cranford Mon RN, CCM, CDCES CCM Clinic RN Care Manager [phone]

## 2020-01-21 NOTE — Patient Instructions (Signed)
Visit Information  Goals Addressed              This Visit's Progress   .  Per Father/Legal Guardian, "He needs long term, supervised care." (pt-stated)        Current Barriers:  [name] Knowledge Barriers related to resources and support available to address needs related to Level of care concerns; placement.   Patient moved into [facility] on 12/02/19.  Patient only remained at ALF until 12/08/19 because father was contacted by Director stating that patient was "acting out" because he wanted to go home.  Father picked patient up that day and took him back home.   Case Manager Clinical Goal(s):  [name] Over the next 180 days, patient will work with BSW to address needs related to Level of care concerns; placement . Over the next 180 days, BSW will collaborate with RN Care Manager to address care management and care coordination needs  Interventions:  . Completed FL 2 faxed to the following Memory Care Facilities:   [facility], [address], [place], [place], [place] . The following Memory Care Facilities do not accept Special Assistance Medicaid:  [facility], [place], [place], [place], [employer]  . Patient Self Care Activities:  . Attends all scheduled provider appointments  Please see past updates related to this goal by clicking on the "Past Updates" button in the selected goal           The patient's caregiver has been provided with contact information for the care management team and has been advised to call with any health related questions or concerns.     [name], BSW Embedded Care Coordination Social Worker [facility] [phone]

## 2020-01-21 NOTE — Chronic Care Management (AMB) (Signed)
°  Care Management   Follow Up Note   01/21/2020 Name: Eric Stephens MRN: [id number] DOB: 08-19-1980  Referred by: Alphonzo Severance, MD Reason for referral : Care Coordination (Placement)   Eric Stephens is a 39 y.o. year old male who is a primary care patient of Alphonzo Severance, MD. The care management team was consulted for assistance with care management and care coordination needs.    Review of patient status, including review of consultants reports, relevant laboratory and other test results, and collaboration with appropriate care team members and the patient's provider was performed as part of comprehensive patient evaluation and provision of chronic care management services.    SDOH (Social Determinants of Health) assessments performed: No See Care Plan activities for detailed interventions related to [facility])     Advanced Directives: See Care Plan and Vynca application for related entries.   Goals Addressed              This Visit's Progress     Per Father/Legal Guardian, "He needs long term, supervised care." (pt-stated)        Current Barriers:   Knowledge Barriers related to resources and support available to address needs related to Level of care concerns; placement.   Patient moved into [facility] on 12/02/19.  Patient only remained at ALF until 12/08/19 because father was contacted by Director stating that patient was "acting out" because he wanted to go home.  Father picked patient up that day and took him back home.   Case Manager Clinical Goal(s):   Over the next 180 days, patient will work with BSW to address needs related to Level of care concerns; placement  Over the next 180 days, BSW will collaborate with RN Care Manager to address care management and care coordination needs  Interventions:   Completed FL 2 faxed to the following Memory Care Facilities:   [facility], [address], [place], [place], [place]  The  following Memory Care Facilities do not accept Special Assistance Medicaid:  [place], [place], [place], [place], [employer]   Patient Self Care Activities:   Attends all scheduled provider appointments  Please see past updates related to this goal by clicking on the "Past Updates" button in the selected goal          The patient's caregiver has been provided with contact information for the care management team and has been advised to call with any health related questions or concerns.     [name], BSW Embedded Care Coordination Social Worker [facility] [phone]

## 2020-01-22 ENCOUNTER — Ambulatory Visit: Payer: Medicaid Other

## 2020-01-22 DIAGNOSIS — F02818 Dementia in other diseases classified elsewhere, unspecified severity, with other behavioral disturbance: Secondary | ICD-10-CM

## 2020-01-22 DIAGNOSIS — F0281 Dementia in other diseases classified elsewhere with behavioral disturbance: Secondary | ICD-10-CM

## 2020-01-22 DIAGNOSIS — A523 Neurosyphilis, unspecified: Secondary | ICD-10-CM

## 2020-01-22 NOTE — Progress Notes (Signed)
Internal Medicine Clinic Resident  I have personally reviewed this encounter including the documentation in this note and/or discussed this patient with the care management provider. I will address any urgent items identified by the care management provider and will communicate my actions to the patient's PCP. I have reviewed the patient's CCM visit with my supervising attending, Dr Mullen.  Sheilah Rayos, MD 01/22/2020    

## 2020-01-22 NOTE — Progress Notes (Signed)
Internal Medicine Clinic Resident  I have personally reviewed this encounter including the documentation in this note and/or discussed this patient with the care management provider. I will address any urgent items identified by the care management provider and will communicate my actions to the patient's PCP. I have reviewed the patient's CCM visit with my supervising attending, Dr Mullen.  Dylanie Quesenberry, MD 01/22/2020    

## 2020-01-22 NOTE — Progress Notes (Signed)
Internal Medicine Clinic Resident  I have personally reviewed this encounter including the documentation in this note and/or discussed this patient with the care management provider. I will address any urgent items identified by the care management provider and will communicate my actions to the patient's PCP. I have reviewed the patient's CCM visit with my supervising attending, Dr Mullen.  Leiby Pigeon, MD 01/22/2020    

## 2020-01-22 NOTE — Chronic Care Management (AMB) (Signed)
  Care Management   Follow Up Note   01/22/2020 Name: EISSA BUCHBERGER MRN: [id number] DOB: 05-03-1981  Referred by: Alphonzo Severance, MD Reason for referral : Care Coordination (Placement)   KYLEN SCHLIEP is a 39 y.o. year old male who is a primary care patient of Alphonzo Severance, MD. The care management team was consulted for assistance with care management and care coordination needs.    Review of patient status, including review of consultants reports, relevant laboratory and other test results, and collaboration with appropriate care team members and the patient's provider was performed as part of comprehensive patient evaluation and provision of chronic care management services.    SDOH (Social Determinants of Health) assessments performed: No See Care Plan activities for detailed interventions related to [facility])     Advanced Directives: See Care Plan and Vynca application for related entries.   Goals Addressed              This Visit's Progress   .  Per Father/Legal Guardian, "He needs long term, supervised care." (pt-stated)        Current Barriers:  [name] Knowledge Barriers related to resources and support available to address needs related to Level of care concerns; placement.   Patient moved into [facility] on 12/02/19.  Patient only remained at ALF until 12/08/19 because father was contacted by Director stating that patient was "acting out" because he wanted to go home.  Father picked patient up that day and took him back home.   Case Manager Clinical Goal(s):  [name] Over the next 180 days, patient will work with BSW to address needs related to Level of care concerns; placement . Over the next 180 days, BSW will collaborate with RN Care Manager to address care management and care coordination needs  Interventions:  . Completed FL 2 faxed to the following Memory Care Facilities:   [facility], [place], [address], [place], [place]  . Patient Self Care  Activities:  . Attends all scheduled provider appointments  Please see past updates related to this goal by clicking on the "Past Updates" button in the selected goal          The patient has been provided with contact information for the care management team and has been advised to call with any health related questions or concerns.     [name], BSW Embedded Care Coordination Social Worker [facility] [phone]

## 2020-01-22 NOTE — Patient Instructions (Signed)
Visit Information  Goals Addressed              This Visit's Progress     Per Father/Legal Guardian, "He needs long term, supervised care." (pt-stated)        Current Barriers:   Knowledge Barriers related to resources and support available to address needs related to Level of care concerns; placement.   Patient moved into [facility] on 12/02/19.  Patient only remained at ALF until 12/08/19 because father was contacted by Director stating that patient was "acting out" because he wanted to go home.  Father picked patient up that day and took him back home.   Case Manager Clinical Goal(s):   Over the next 180 days, patient will work with BSW to address needs related to Level of care concerns; placement  Over the next 180 days, BSW will collaborate with RN Care Manager to address care management and care coordination needs  Interventions:   Completed FL 2 faxed to the following Memory Care Facilities:   [facility]	[place], [place], [facility], [place], [place]   Patient Self Care Activities:   Attends all scheduled provider appointments  Please see past updates related to this goal by clicking on the "Past Updates" button in the selected goal           The patient has been provided with contact information for the care management team and has been advised to call with any health related questions or concerns.     [name], BSW Embedded Care Coordination Social Worker [facility] [phone]

## 2020-01-23 ENCOUNTER — Ambulatory Visit: Payer: Medicaid Other

## 2020-01-23 DIAGNOSIS — A523 Neurosyphilis, unspecified: Secondary | ICD-10-CM

## 2020-01-23 DIAGNOSIS — F02818 Dementia in other diseases classified elsewhere, unspecified severity, with other behavioral disturbance: Secondary | ICD-10-CM

## 2020-01-23 DIAGNOSIS — F0281 Dementia in other diseases classified elsewhere with behavioral disturbance: Secondary | ICD-10-CM

## 2020-01-23 NOTE — Chronic Care Management (AMB) (Signed)
  Care Management   Follow Up Note   01/23/2020 Name: [name] MRN: [id number] DOB: [birth year]  Referred by: Alphonzo Severance, MD Reason for referral : Care Coordination (Placement)   Eric Stephens is a 39 y.o. year old male who is a primary care patient of Alphonzo Severance, MD. The care management team was consulted for assistance with care management and care coordination needs.    Review of patient status, including review of consultants reports, relevant laboratory and other test results, and collaboration with appropriate care team members and the patient's provider was performed as part of comprehensive patient evaluation and provision of chronic care management services.    SDOH (Social Determinants of Health) assessments performed: No See Care Plan activities for detailed interventions related to [facility])     Advanced Directives: See Care Plan and Vynca application for related entries.   Goals Addressed              This Visit's Progress   .  Per Father/Legal Guardian, "He needs long term, supervised care." (pt-stated)        Current Barriers:  [name] Knowledge Barriers related to resources and support available to address needs related to Level of care concerns; placement.   Patient moved into [facility] on 12/02/19.  Patient only remained at ALF until 12/08/19 because father was contacted by Director stating that patient was "acting out" because he wanted to go home.  Father picked patient up that day and took him back home.   Case Manager Clinical Goal(s):  [name] Over the next 180 days, patient will work with BSW to address needs related to Level of care concerns; placement . Over the next 180 days, BSW will collaborate with RN Care Manager to address care management and care coordination needs  Interventions:  . Completed FL 2 faxed to the following Memory Care Facilities for a second time as fax was unsuccessful yesterday:  [facility], [place], [facility] [place] . Called father to  inform him of facilities that [facility] 2 has been sent to  . Patient Self Care Activities:  . Attends all scheduled provider appointments  Please see past updates related to this goal by clicking on the "Past Updates" button in the selected goal          The patient's guardian has been provided with contact information for the care management team and has been advised to call with any health related questions or concerns.  Will follow up with memory care facilities next week.       [name], BSW Embedded Care Coordination Social Worker [facility] [phone]

## 2020-01-23 NOTE — Progress Notes (Signed)
Internal Medicine Clinic Resident  I have personally reviewed this encounter including the documentation in this note and/or discussed this patient with the care management provider. I will address any urgent items identified by the care management provider and will communicate my actions to the patient's PCP. I have reviewed the patient's CCM visit with my supervising attending, Dr Guilloud.  Breeanna Galgano M Ceaira Ernster, MD 01/23/2020    

## 2020-01-23 NOTE — Patient Instructions (Signed)
Visit Information  Goals Addressed              This Visit's Progress   .  Per Father/Legal Guardian, "He needs long term, supervised care." (pt-stated)        Current Barriers:  [name] Knowledge Barriers related to resources and support available to address needs related to Level of care concerns; placement.   Patient moved into [facility] on 12/02/19.  Patient only remained at ALF until 12/08/19 because father was contacted by Director stating that patient was "acting out" because he wanted to go home.  Father picked patient up that day and took him back home.   Case Manager Clinical Goal(s):  [name] Over the next 180 days, patient will work with BSW to address needs related to Level of care concerns; placement . Over the next 180 days, BSW will collaborate with RN Care Manager to address care management and care coordination needs  Interventions:  . Completed FL 2 faxed to the following Memory Care Facilities for a second time as fax was unsuccessful yesterday:  [facility], [place], [facility] [place] . Called father to inform him of facilities that [facility] 2 has been sent to  . Patient Self Care Activities:  . Attends all scheduled provider appointments  Please see past updates related to this goal by clicking on the "Past Updates" button in the selected goal         Patient's guardian verbalizes understanding of instructions provided today.   The patient's guardian has been provided with contact information for the care management team and has been advised to call with any health related questions or concerns.  Will follow up with memory care facilities next week.

## 2020-01-26 ENCOUNTER — Ambulatory Visit: Payer: Medicaid Other

## 2020-01-26 DIAGNOSIS — A523 Neurosyphilis, unspecified: Secondary | ICD-10-CM

## 2020-01-26 DIAGNOSIS — F0281 Dementia in other diseases classified elsewhere with behavioral disturbance: Secondary | ICD-10-CM

## 2020-01-26 DIAGNOSIS — F02818 Dementia in other diseases classified elsewhere, unspecified severity, with other behavioral disturbance: Secondary | ICD-10-CM

## 2020-01-26 NOTE — Chronic Care Management (AMB) (Signed)
  Care Management   Follow Up Note   01/26/2020 Name: Eric Stephens MRN: [id number] DOB: 01-12-1981  Referred by: Alphonzo Severance, MD Reason for referral : Care Coordination (Placement)   Eric Stephens is a 39 y.o. year old male who is a primary care patient of Alphonzo Severance, MD. The care management team was consulted for assistance with care management and care coordination needs.    Review of patient status, including review of consultants reports, relevant laboratory and other test results, and collaboration with appropriate care team members and the patient's provider was performed as part of comprehensive patient evaluation and provision of chronic care management services.    SDOH (Social Determinants of Health) assessments performed: No See Care Plan activities for detailed interventions related to [facility])     Advanced Directives: See Care Plan and Vynca application for related entries.   Goals Addressed              This Visit's Progress   .  Per Father/Legal Guardian, "He needs long term, supervised care." (pt-stated)        Current Barriers:  [name] Knowledge Barriers related to resources and support available to address needs related to Level of care concerns; placement.   Patient moved into [facility] on 12/02/19.  Patient only remained at ALF until 12/08/19 because father was contacted by Director stating that patient was "acting out" because he wanted to go home.  Father picked patient up that day and took him back home.   Case Manager Clinical Goal(s):  [name] Over the next 180 days, patient will work with BSW to address needs related to Level of care concerns; placement . Over the next 180 days, BSW will collaborate with RN Care Manager to address care management and care coordination needs  Interventions:  . Contacted the following Memory Care Facilities to confirm fax numbers as attempts to fax FL 2 have been unsuccessful [place], [facility] . Completed FL 2 faxed to  facilities for third time today.   . Patient Self Care Activities:  . Attends all scheduled provider appointments  Please see past updates related to this goal by clicking on the "Past Updates" button in the selected goal          The patient's guardian has been provided with contact information for the care management team and has been advised to call with any health related questions or concerns.   SIGNATURE

## 2020-01-26 NOTE — Progress Notes (Signed)
Internal Medicine Clinic Resident  I have personally reviewed this encounter including the documentation in this note and/or discussed this patient with the care management provider. I will address any urgent items identified by the care management provider and will communicate my actions to the patient's PCP. I have reviewed the patient's CCM visit with my supervising attending, Dr Hoffman.  Tekila Caillouet, MD 01/26/2020    

## 2020-01-26 NOTE — Patient Instructions (Signed)
Visit Information  Goals Addressed              This Visit's Progress     Per Father/Legal Guardian, "He needs long term, supervised care." (pt-stated)        Current Barriers:   Knowledge Barriers related to resources and support available to address needs related to Level of care concerns; placement.   Patient moved into [facility] on 12/02/19.  Patient only remained at ALF until 12/08/19 because father was contacted by Director stating that patient was "acting out" because he wanted to go home.  Father picked patient up that day and took him back home.   Case Manager Clinical Goal(s):   Over the next 180 days, patient will work with BSW to address needs related to Level of care concerns; placement  Over the next 180 days, BSW will collaborate with RN Care Manager to address care management and care coordination needs  Interventions:   Contacted the following Memory Care Facilities to confirm fax numbers as attempts to fax FL 2 have been unsuccessful [place], [facility]  Completed FL 2 faxed to facilities for third time today.    Patient Self Care Activities:   Attends all scheduled provider appointments  Please see past updates related to this goal by clicking on the "Past Updates" button in the selected goal           The patient's guardian has been provided with contact information for the care management team and has been advised to call with any health related questions or concerns.     [name], BSW Embedded Care Coordination Social Worker [facility] [phone]

## 2020-01-27 ENCOUNTER — Ambulatory Visit: Payer: Medicaid Other

## 2020-01-27 DIAGNOSIS — F0281 Dementia in other diseases classified elsewhere with behavioral disturbance: Secondary | ICD-10-CM

## 2020-01-27 DIAGNOSIS — A523 Neurosyphilis, unspecified: Secondary | ICD-10-CM

## 2020-01-27 DIAGNOSIS — F02818 Dementia in other diseases classified elsewhere, unspecified severity, with other behavioral disturbance: Secondary | ICD-10-CM

## 2020-01-27 NOTE — Chronic Care Management (AMB) (Signed)
  Care Management   Follow Up Note   01/27/2020 Name: Eric Stephens MRN: [id number] DOB: [birth year]  Referred by: Alphonzo Severance, MD Reason for referral : Care Coordination (Placement)   Eric Stephens is a 39 y.o. year old male who is a primary care patient of Alphonzo Severance, MD. The care management team was consulted for assistance with care management and care coordination needs.    Review of patient status, including review of consultants reports, relevant laboratory and other test results, and collaboration with appropriate care team members and the patient's provider was performed as part of comprehensive patient evaluation and provision of chronic care management services.    SDOH (Social Determinants of Health) assessments performed: No See Care Plan activities for detailed interventions related to [facility])     Advanced Directives: See Care Plan and Vynca application for related entries.   Goals Addressed              This Visit's Progress   .  Per Father/Legal Guardian, "He needs long term, supervised care." (pt-stated)        Current Barriers:  [name] Knowledge Barriers related to resources and support available to address needs related to Level of care concerns; placement.   Patient moved into [facility] on 12/02/19.  Patient only remained at ALF until 12/08/19 because father was contacted by Director stating that patient was "acting out" because he wanted to go home.  Father picked patient up that day and took him back home.   Case Manager Clinical Goal(s):  [name] Over the next 180 days, patient will work with BSW to address needs related to Level of care concerns; placement . Over the next 180 days, BSW will collaborate with RN Care Manager to address care management and care coordination needs  Interventions:  . Contacted the following Memory Care Facilities regarding FL 2 that was faxed and availability for placement.     -[facility]-extensive wait list for Medicaid  bed  -[facility]-left message with Librarian, academic, Despina Hick  -[facility]-Left message for Admissions Coordinator  -[facility] accepts patients 83 and older  -[facility] only available to those     That are already resident of Independent Living program  -[facility]-Left message with Resident Coordinator, Jonelle Sidle (out of the office until 8/16) . Faxed FL 2 to the following facilities:  [facility] [place] [facility]  . Patient Self Care Activities:  . Attends all scheduled provider appointments  Please see past updates related to this goal by clicking on the "Past Updates" button in the selected goal          The patient's guardian has been provided with contact information for the care management team and has been advised to call with any health/community resource related questions or concerns.     [name], BSW Embedded Care Coordination Social Worker [facility] [phone]

## 2020-01-27 NOTE — Patient Instructions (Signed)
Visit Information  Goals Addressed              This Visit's Progress   .  Per Father/Legal Guardian, "He needs long term, supervised care." (pt-stated)        Current Barriers:  [name] Knowledge Barriers related to resources and support available to address needs related to Level of care concerns; placement.   Patient moved into [facility] on 12/02/19.  Patient only remained at ALF until 12/08/19 because father was contacted by Director stating that patient was "acting out" because he wanted to go home.  Father picked patient up that day and took him back home.   Case Manager Clinical Goal(s):  [name] Over the next 180 days, patient will work with BSW to address needs related to Level of care concerns; placement . Over the next 180 days, BSW will collaborate with RN Care Manager to address care management and care coordination needs  Interventions:  . Contacted the following Memory Care Facilities regarding FL 2 that was faxed and availability for placement.     -[facility]-extensive wait list for Medicaid bed  -[facility]-left message with Librarian, academic, Despina Hick  -[facility]-Left message for Admissions Coordinator  -[facility] accepts patients 51 and older  -[facility] only available to those     That are already resident of Independent Living program  -[facility]-Left message with Resident Coordinator, Jonelle Sidle (out of the office until 8/16) . Faxed FL 2 to the following facilities:  [facility] [place] and [facility]  . Patient Self Care Activities:  . Attends all scheduled provider appointments  Please see past updates related to this goal by clicking on the "Past Updates" button in the selected goal           The patient's guardian has been provided with contact information for the care management team and has been advised to call with any health related questions or concerns.      [name], BSW Embedded Care Coordination Social Worker [facility] [phone]

## 2020-01-28 NOTE — Progress Notes (Signed)
Internal Medicine Clinic Attending  CCM services provided by the care management provider and their documentation were discussed with Dr. Jinwala. We reviewed the pertinent findings, urgent action items addressed by the resident and non-urgent items to be addressed by the PCP.  I agree with the assessment, diagnosis, and plan of care documented in the CCM and resident's note.  Rhiannon Sassaman, MD 01/28/2020  

## 2020-01-28 NOTE — Progress Notes (Signed)
Internal Medicine Clinic Attending  CCM services provided by the care management provider and their documentation were discussed with Dr. Jinwala. We reviewed the pertinent findings, urgent action items addressed by the resident and non-urgent items to be addressed by the PCP.  I agree with the assessment, diagnosis, and plan of care documented in the CCM and resident's note.  Hardin Hardenbrook, MD 01/28/2020  

## 2020-01-28 NOTE — Progress Notes (Signed)
Internal Medicine Clinic Attending  CCM services provided by the care management provider and their documentation were discussed with Dr. Jinwala. We reviewed the pertinent findings, urgent action items addressed by the resident and non-urgent items to be addressed by the PCP.  I agree with the assessment, diagnosis, and plan of care documented in the CCM and resident's note.  Azara Gemme, MD 01/28/2020  

## 2020-01-28 NOTE — Progress Notes (Signed)
Internal Medicine Clinic Resident  I have personally reviewed this encounter including the documentation in this note and/or discussed this patient with the care management provider. I will address any urgent items identified by the care management provider and will communicate my actions to the patient's PCP. I have reviewed the patient's CCM visit with my supervising attending, Dr Hoffman.  Mora Pedraza, MD 01/28/2020    

## 2020-01-28 NOTE — Progress Notes (Signed)
Internal Medicine Clinic Attending  CCM services provided by the care management provider and their documentation were discussed with Dr. Krienke. We reviewed the pertinent findings, urgent action items addressed by the resident and non-urgent items to be addressed by the PCP.  I agree with the assessment, diagnosis, and plan of care documented in the CCM and resident's note.  Sheriann Newmann, MD 01/28/2020 

## 2020-02-03 ENCOUNTER — Ambulatory Visit: Payer: Medicaid Other

## 2020-02-03 DIAGNOSIS — F0281 Dementia in other diseases classified elsewhere with behavioral disturbance: Secondary | ICD-10-CM

## 2020-02-03 DIAGNOSIS — F02818 Dementia in other diseases classified elsewhere, unspecified severity, with other behavioral disturbance: Secondary | ICD-10-CM

## 2020-02-03 DIAGNOSIS — A523 Neurosyphilis, unspecified: Secondary | ICD-10-CM

## 2020-02-03 NOTE — Chronic Care Management (AMB) (Signed)
  Care Management   Follow Up Note   02/03/2020 Name: Eric Stephens MRN: [id number] DOB: 1981-06-11  Referred by: Alphonzo Severance, MD Reason for referral : Care Coordination (Placement)   Eric Stephens is a 39 y.o. year old male who is a primary care patient of Alphonzo Severance, MD. The care management team was consulted for assistance with care management and care coordination needs.    Review of patient status, including review of consultants reports, relevant laboratory and other test results, and collaboration with appropriate care team members and the patient's provider was performed as part of comprehensive patient evaluation and provision of chronic care management services.    SDOH (Social Determinants of Health) assessments performed: No See Care Plan activities for detailed interventions related to [facility])     Advanced Directives: See Care Plan and Vynca application for related entries.   Goals Addressed              This Visit's Progress   .  Per Father/Legal Guardian, "He needs long term, supervised care." (pt-stated)        Current Barriers:  Eric Stephens Knowledge Barriers related to resources and support available to address needs related to Level of care concerns; placement.   Patient moved into [facility] on 12/02/19.  Patient only remained at ALF until 12/08/19 because father was contacted by Director stating that patient was "acting out" because he wanted to go home.  Father picked patient up that day and took him back home.   Case Manager Clinical Goal(s):  Eric Stephens Over the next 180 days, patient will work with BSW to address needs related to Level of care concerns; placement . Over the next 180 days, BSW will collaborate with RN Care Manager to address care management and care coordination needs  Interventions:  . Contacted [employer] due to no return call from message left on 01/27/20.  Placed on hold x2 for an extensive period of time then call was disconnected.  Called back and  left second message for Librarian, academic, Eric Stephens . Contacted [facility] due to no return call from message left on 01/27/20; second message left for Admissions Coordinator . Contacted Eric Stephens x2 to follow up on FL 2 that was faxed on 01/27/20.  Transferred to Eric Stephens, Admission Coordinator, both times with no answer or option to leave message . Contacted Eric Stephens to follow up on FL 2 that was faxed on 01/27/20; left message for Admission Coordinator . Faxed FL 2 to the following facilities:  [facility], [facility], [facility], [facility] [place], [address], [place]  . Patient Self Care Activities:  . Attends all scheduled provider appointments  Please see past updates related to this goal by clicking on the "Past Updates" button in the selected goal          The patient's guardian has been provided with contact information for the care management team and has been advised to call with any health/community resource related questions or concerns.      Eric Stephens, BSW Embedded Care Coordination Social Worker [facility] [phone]

## 2020-02-03 NOTE — Progress Notes (Signed)
Internal Medicine Clinic Resident  I have personally reviewed this encounter including the documentation in this note and/or discussed this patient with the care management provider. I will address any urgent items identified by the care management provider and will communicate my actions to the patient's PCP. I have reviewed the patient's CCM visit with my supervising attending, Dr Raines.  Tomas Schamp Y Alexsandro Salek, MD 02/03/2020    

## 2020-02-03 NOTE — Patient Instructions (Signed)
Visit Information  Goals Addressed              This Visit's Progress     Per Father/Legal Guardian, "He needs long term, supervised care." (pt-stated)        Current Barriers:   Knowledge Barriers related to resources and support available to address needs related to Level of care concerns; placement.   Patient moved into [facility] on 12/02/19.  Patient only remained at ALF until 12/08/19 because father was contacted by Director stating that patient was "acting out" because he wanted to go home.  Father picked patient up that day and took him back home.   Case Manager Clinical Goal(s):   Over the next 180 days, patient will work with BSW to address needs related to Level of care concerns; placement  Over the next 180 days, BSW will collaborate with RN Care Manager to address care management and care coordination needs  Interventions:   Contacted [facility] due to no return call from message left on 01/27/20.  Placed on hold x2 for an extensive period of time then call was disconnected.  Called back and left second message for Executive Director, Marcelino Duster  [facility] due to no return call from message left on 01/27/20; second message left for Admissions Coordinator  Contacted [facility] x2 to follow up on FL 2 that was faxed on 01/27/20.  Transferred to Boykin Peek, Admission Coordinator, both times with no answer or option to leave message  [facility] to follow up on FL 2 that was faxed on 01/27/20; left message for Admission Coordinator  Faxed FL 2 to the following facilities:  [facility], [facility], [facility], [facility] [place], [place], [place]   Patient Self Care Activities:   Attends all scheduled provider appointments  Please see past updates related to this goal by clicking on the "Past Updates" button in the selected goal             The patient has been provided with contact information for the care management team and has been advised to call with any health/community resource related questions or concerns.      [name], BSW Embedded Care Coordination Social Worker [facility] [phone]

## 2020-02-04 ENCOUNTER — Ambulatory Visit: Payer: Medicaid Other

## 2020-02-04 DIAGNOSIS — F0281 Dementia in other diseases classified elsewhere with behavioral disturbance: Secondary | ICD-10-CM

## 2020-02-04 DIAGNOSIS — A523 Neurosyphilis, unspecified: Secondary | ICD-10-CM

## 2020-02-04 DIAGNOSIS — F02818 Dementia in other diseases classified elsewhere, unspecified severity, with other behavioral disturbance: Secondary | ICD-10-CM

## 2020-02-04 NOTE — Chronic Care Management (AMB) (Signed)
  Care Management   Follow Up Note   02/04/2020 Name: DAYYAN KRIST MRN: [id number] DOB: 06/28/1980  Referred by: Alphonzo Severance, MD Reason for referral : Care Coordination (Placement)   ANDRI PRESTIA is a 39 y.o. year old male who is a primary care patient of Alphonzo Severance, MD. The care management team was consulted for assistance with care management and care coordination needs.    Review of patient status, including review of consultants reports, relevant laboratory and other test results, and collaboration with appropriate care team members and the patient's provider was performed as part of comprehensive patient evaluation and provision of chronic care management services.    SDOH (Social Determinants of Health) assessments performed: No See Care Plan activities for detailed interventions related to [facility])     Advanced Directives: See Care Plan and Vynca application for related entries.   Goals Addressed              This Visit's Progress   .  Per Father/Legal Guardian, "He needs long term, supervised care." (pt-stated)        Current Barriers:  [name] Knowledge Barriers related to resources and support available to address needs related to Level of care concerns; placement.   Patient moved into [facility] on 12/02/19.  Patient only remained at ALF until 12/08/19 because father was contacted by Director stating that patient was "acting out" because he wanted to go home.  Father picked patient up that day and took him back home.   Case Manager Clinical Goal(s):  [name] Over the next 180 days, patient will work with BSW to address needs related to Level of care concerns; placement . Over the next 180 days, BSW will collaborate with RN Care Manager to address care management and care coordination needs  Interventions:  . Received response from Librarian, academic at [facility]; patients should be 65 or older . Left message with Admissions Dept/Sheila at [facility] . Talked with Grayce Sessions, Admissions SW at [facility].  Facility has wait list.  Will mail application to guardian . Left message with Admissions Director with [facility], [place] . Left message for Doren Custard at [facility] [place].  Received follow up message-currently not accepting outside admissions due to COVID 19 . Talked with Knox Saliva, Admissions Director with [facility] at [facility]; patient too young . [facility]-attempted to contact Admissions Dept.  No answer or option to leave message . Talked to Jonelle Sidle in Admission Department at [facility]; currently no male beds and will no longer have Medicaid beds as of October . Second attempt to contact Boykin Peek with [facility] of [place]; no answer or option to leave message . Left message for Renee, Admissions Director with [facility]  . Patient Self Care Activities:  . Attends all scheduled provider appointments  Please see past updates related to this goal by clicking on the "Past Updates" button in the selected goal          Will continue seeking placement options.    [name], BSW Embedded Care Coordination Social Worker [facility] [phone]

## 2020-02-04 NOTE — Progress Notes (Signed)
Internal Medicine Clinic Attending  CCM services provided by the care management provider and their documentation were reviewed with Dr. Chen.  We reviewed the pertinent findings, urgent action items addressed by the resident and non-urgent items to be addressed by the PCP.  I agree with the assessment, diagnosis, and plan of care documented in the CCM and resident's note.  Eric Stephens N Eric Savastano, MD 02/04/2020  

## 2020-02-04 NOTE — Patient Instructions (Signed)
Visit Information  Goals Addressed              This Visit's Progress   .  Per Father/Legal Guardian, "He needs long term, supervised care." (pt-stated)        Current Barriers:  [name] Knowledge Barriers related to resources and support available to address needs related to Level of care concerns; placement.   Patient moved into [facility] on 12/02/19.  Patient only remained at ALF until 12/08/19 because father was contacted by Director stating that patient was "acting out" because he wanted to go home.  Father picked patient up that day and took him back home.   Case Manager Clinical Goal(s):  [name] Over the next 180 days, patient will work with BSW to address needs related to Level of care concerns; placement . Over the next 180 days, BSW will collaborate with RN Care Manager to address care management and care coordination needs  Interventions:  . Received response from Librarian, academic at [facility]; patients should be 65 or older . Left message with Admissions Dept/Sheila at [facility] . Talked with Grayce Sessions, Admissions SW at [facility].  Facility has wait list.  Will mail application to guardian . Left message with Admissions Director with [facility], [place] . Left message for Doren Custard at [facility] [place].  Received follow up message-currently not accepting outside admissions due to COVID 19 . Talked with Knox Saliva, Admissions Director with [facility] at [facility]; patient too young . [place]-attempted to contact Admissions Dept.  No answer or option to leave message . Talked to Jonelle Sidle in Admission Department at [facility]; currently no male beds and will no longer have Medicaid beds as of October . Second attempt to contact Boykin Peek with [facility] [place]; no answer or option to leave message . Left message for Renee, Admissions Director with [facility]  . Patient Self Care Activities:  . Attends all  scheduled provider appointments  Please see past updates related to this goal by clicking on the "Past Updates" button in the selected goal           Will continue seeking placement options.    [name], BSW Embedded Care Coordination Social Worker [facility] [phone]

## 2020-02-05 ENCOUNTER — Ambulatory Visit: Payer: Medicaid Other

## 2020-02-05 DIAGNOSIS — A523 Neurosyphilis, unspecified: Secondary | ICD-10-CM

## 2020-02-05 DIAGNOSIS — F0281 Dementia in other diseases classified elsewhere with behavioral disturbance: Secondary | ICD-10-CM

## 2020-02-05 DIAGNOSIS — F02818 Dementia in other diseases classified elsewhere, unspecified severity, with other behavioral disturbance: Secondary | ICD-10-CM

## 2020-02-05 NOTE — Progress Notes (Signed)
Internal Medicine Clinic Resident  I have personally reviewed this encounter including the documentation in this note and/or discussed this patient with the care management provider. I will address any urgent items identified by the care management provider and will communicate my actions to the patient's PCP. I have reviewed the patient's CCM visit with my supervising attending, Dr Hoffman.  Shulem Mader Y Mily Malecki, MD 02/05/2020    

## 2020-02-05 NOTE — Progress Notes (Signed)
Internal Medicine Clinic Resident  I have personally reviewed this encounter including the documentation in this note and/or discussed this patient with the care management provider. I will address any urgent items identified by the care management provider and will communicate my actions to the patient's PCP. I have reviewed the patient's CCM visit with my supervising attending, Dr Hoffman.  Vyctoria Dickman Y Demonte Dobratz, MD 02/05/2020    

## 2020-02-05 NOTE — Chronic Care Management (AMB) (Signed)
Care Management   Follow Up Note   02/05/2020 Name: Eric Stephens MRN: [id number] DOB: 01-Jun-1981  Referred by: Alphonzo Severance, MD Reason for referral : Care Coordination (Placement)   Eric Stephens is a 39 y.o. year old male who is a primary care patient of Alphonzo Severance, MD. The care management team was consulted for assistance with care management and care coordination needs.    Review of patient status, including review of consultants reports, relevant laboratory and other test results, and collaboration with appropriate care team members and the patient's provider was performed as part of comprehensive patient evaluation and provision of chronic care management services.    SDOH (Social Determinants of Health) assessments performed: No See Care Plan activities for detailed interventions related to [facility])     Advanced Directives: See Care Plan and Vynca application for related entries.   Goals Addressed              This Visit's Progress   .  Per Father/Legal Guardian, "He needs long term, supervised care." (pt-stated)        Current Barriers:  [name] Knowledge Barriers related to resources and support available to address needs related to Level of care concerns; placement.   Patient moved into [facility] on 12/02/19.  Patient only remained at ALF until 12/08/19 because father was contacted by Director stating that patient was "acting out" because he wanted to go home.  Father picked patient up that day and took him back home.   Case Manager Clinical Goal(s):  [name] Over the next 180 days, patient will work with BSW to address needs related to Level of care concerns; placement . Over the next 180 days, BSW will collaborate with RN Care Manager to address care management and care coordination needs  Interventions:  . Spoke to Jettie Booze in Admissions Department of [facility] and [place].  Attempted to fax FL 2 twice but did not go through.  Left voicemail message for Ms. Medley  requesting call back to confirm fax number . Spoke with Marylene Land in Admissions Department at [facility]; typically don't accept patients this young and do no have Medicaid beds available. These beds tend to stay full with current residents that transition to them. . Left message for Tammy with Admissions Department at [facility].  Received return message from her.  Called back and left second message for her . Talked to Jimmy Picket with Admissions Department at [facility].  Faxed FL 2 . Talked to Hillery Hunter in Admissions at [facility].  Sent FL 2 via secure email .  Received response that "administrator did not offer bed".  Messaged back requesting reason for denial . Spoke to Genia Harold in Admissions Department at [facility]; will not take patient due to age . Left message in Admissions Department general mailbox at [facility] and [facility] . Talked to Roanna Epley in Admissions Department at [facility]; not accepting new patients . Left message for SW at [facility] at [place]  . Patient Self Care Activities:  . Attends all scheduled provider appointments  Please see past updates related to this goal by clicking on the "Past Updates" button in the selected goal          Plan to call patient's father/guardian tomorrow to update on placement efforts.       [name], BSW Embedded Care Coordination Social Worker [facility] [phone]

## 2020-02-05 NOTE — Patient Instructions (Signed)
Visit Information  Goals Addressed              This Visit's Progress   .  Per Father/Legal Guardian, "He needs long term, supervised care." (pt-stated)        Current Barriers:  [name] Knowledge Barriers related to resources and support available to address needs related to Level of care concerns; placement.   Patient moved into [facility] on 12/02/19.  Patient only remained at ALF until 12/08/19 because father was contacted by Director stating that patient was "acting out" because he wanted to go home.  Father picked patient up that day and took him back home.   Case Manager Clinical Goal(s):  [name] Over the next 180 days, patient will work with BSW to address needs related to Level of care concerns; placement . Over the next 180 days, BSW will collaborate with RN Care Manager to address care management and care coordination needs  Interventions:  . Spoke to Jettie Booze in Admissions Department of [facility] and [place].  Attempted to fax FL 2 twice but did not go through.  Left voicemail message for Ms. Medley requesting call back to confirm fax number . Spoke with Marylene Land in Admissions Department at [facility]; typically don't accept patients this young and do no have Medicaid beds available. These beds tend to stay full with current residents that transition to them. . Left message for Tammy with Admissions Department at [facility].  Received return message from her.  Called back and left second message for her . Talked to Jimmy Picket with Admissions Department at [facility].  Faxed FL 2 . Talked to Hillery Hunter in Admissions at [facility].  Sent FL 2 via secure email .  Received response that "administrator did not offer bed".  Messaged back requesting reason for denial . Spoke to Genia Harold in Admissions Department at [facility]; will not take patient due to age . Left message in Admissions Department general  mailbox at [facility] and [facility] . Talked to Roanna Epley in Admissions Department at [facility]; not accepting new patients . Left message for SW at [facility] at [place]  . Patient Self Care Activities:  . Attends all scheduled provider appointments  Please see past updates related to this goal by clicking on the "Past Updates" button in the selected goal          Plan to follow up with patient's father/guardian to update on placement efforts.      [name], BSW Embedded Care Coordination Social Worker [facility] [phone]

## 2020-02-06 ENCOUNTER — Ambulatory Visit: Payer: Medicaid Other

## 2020-02-06 DIAGNOSIS — F0281 Dementia in other diseases classified elsewhere with behavioral disturbance: Secondary | ICD-10-CM

## 2020-02-06 DIAGNOSIS — F02818 Dementia in other diseases classified elsewhere, unspecified severity, with other behavioral disturbance: Secondary | ICD-10-CM

## 2020-02-06 DIAGNOSIS — A523 Neurosyphilis, unspecified: Secondary | ICD-10-CM

## 2020-02-06 NOTE — Progress Notes (Signed)
Internal Medicine Clinic Resident  I have personally reviewed this encounter including the documentation in this note and/or discussed this patient with the care management provider. I will address any urgent items identified by the care management provider and will communicate my actions to the patient's PCP. I have reviewed the patient's CCM visit with my supervising attending, Dr Raines.  Gavina Dildine, MD 02/06/2020    

## 2020-02-06 NOTE — Patient Instructions (Addendum)
Visit Information  Goals Addressed              This Visit's Progress   .  Per Father/Legal Guardian, "He needs long term, supervised care." (pt-stated)        Current Barriers:  [name] Knowledge Barriers related to resources and support available to address needs related to Level of care concerns; placement.   Patient moved into [facility] on 12/02/19.  Patient only remained at ALF until 12/08/19 because father was contacted by Director stating that patient was "acting out" because he wanted to go home.  Father picked patient up that day and took him back home.   Case Manager Clinical Goal(s):  [name] Over the next 180 days, patient will work with BSW to address needs related to Level of care concerns; placement . Over the next 180 days, BSW will collaborate with RN Care Manager to address care management and care coordination needs  Interventions:  . Received response from Olegario Messier at [facility]hab that patient was denied placement due to age  . Informed father/guardian that patient has been denied admission at a total of 14 facilities thus far.  . Informed father/guardian that he will receive an application/packet from [facility] if he wishes to have patient added to the wait list there . Informed father/guardian that CCM BSW is awaiting response from [facility] and [employer] . Informed father that a letter will be mailed to him with list of facilities that have denied placement for patient.    . Patient Self Care Activities:  . Attends all scheduled provider appointments  Please see past updates related to this goal by clicking on the "Past Updates" button in the selected goal         Patient's father/guardian verbalizes understanding of instructions provided today.   The patient's father/guardian has been provided with contact information for the care management team and has been advised to call with any health/community resource related questions or concerns.      [name], BSW Embedded Care Coordination Social Worker [facility] [phone]

## 2020-02-06 NOTE — Chronic Care Management (AMB) (Signed)
  Care Management   Follow Up Note   02/06/2020 Name: MILIANO COTTEN MRN: [id number] DOB: 03/22/1981  Referred by: Alphonzo Severance, MD Reason for referral : Care Coordination (Placement)   TERELL KINCY is a 39 y.o. year old male who is a primary care patient of Alphonzo Severance, MD. The care management team was consulted for assistance with care management and care coordination needs.    Review of patient status, including review of consultants reports, relevant laboratory and other test results, and collaboration with appropriate care team members and the patient's provider was performed as part of comprehensive patient evaluation and provision of chronic care management services.    SDOH (Social Determinants of Health) assessments performed: No See Care Plan activities for detailed interventions related to [facility])     Advanced Directives: See Care Plan and Vynca application for related entries.   Goals Addressed              This Visit's Progress   .  Per Father/Legal Guardian, "He needs long term, supervised care." (pt-stated)        Current Barriers:  [name] Knowledge Barriers related to resources and support available to address needs related to Level of care concerns; placement.   Patient moved into [facility] on 12/02/19.  Patient only remained at ALF until 12/08/19 because father was contacted by Director stating that patient was "acting out" because he wanted to go home.  Father picked patient up that day and took him back home.   Case Manager Clinical Goal(s):  [name] Over the next 180 days, patient will work with BSW to address needs related to Level of care concerns; placement . Over the next 180 days, BSW will collaborate with RN Care Manager to address care management and care coordination needs  Interventions:  . Received response from Olegario Messier at [facility] that patient was denied placement due to age  . Informed father/guardian that patient has been denied  admission at a total of 14 facilities thus far.  . Informed father/guardian that he will receive an application/packet from [facility] if he wishes to have patient added to the wait list there . Informed father/guardian that CCM BSW is awaiting response from [facility] and [employer] . Informed father that a letter will be mailed to him with list of facilities that have denied placement for patient.    . Patient Self Care Activities:  . Attends all scheduled provider appointments  Please see past updates related to this goal by clicking on the "Past Updates" button in the selected goal          The patient's father/guardian has been provided with contact information for the care management team and has been advised to call with any health/community resource related questions or concerns.     [name], BSW Embedded Care Coordination Social Worker [facility] [phone]

## 2020-02-09 NOTE — Progress Notes (Signed)
Internal Medicine Clinic Attending  CCM services provided by the care management provider and their documentation were reviewed with Dr. Masoudi.  We reviewed the pertinent findings, urgent action items addressed by the resident and non-urgent items to be addressed by the PCP.  I agree with the assessment, diagnosis, and plan of care documented in the CCM and resident's note.  Gurveer Colucci N Sunny Aguon, MD 02/09/2020  

## 2020-02-10 ENCOUNTER — Ambulatory Visit: Payer: Medicaid Other

## 2020-02-10 ENCOUNTER — Encounter: Payer: Self-pay | Admitting: Podiatry

## 2020-02-10 ENCOUNTER — Ambulatory Visit (INDEPENDENT_AMBULATORY_CARE_PROVIDER_SITE_OTHER): Payer: Medicaid Other | Admitting: Podiatry

## 2020-02-10 ENCOUNTER — Other Ambulatory Visit: Payer: Self-pay

## 2020-02-10 DIAGNOSIS — F0281 Dementia in other diseases classified elsewhere with behavioral disturbance: Secondary | ICD-10-CM

## 2020-02-10 DIAGNOSIS — G63 Polyneuropathy in diseases classified elsewhere: Secondary | ICD-10-CM

## 2020-02-10 DIAGNOSIS — A523 Neurosyphilis, unspecified: Secondary | ICD-10-CM

## 2020-02-10 DIAGNOSIS — M79674 Pain in right toe(s): Secondary | ICD-10-CM | POA: Diagnosis not present

## 2020-02-10 DIAGNOSIS — B351 Tinea unguium: Secondary | ICD-10-CM

## 2020-02-10 DIAGNOSIS — F02818 Dementia in other diseases classified elsewhere, unspecified severity, with other behavioral disturbance: Secondary | ICD-10-CM

## 2020-02-10 DIAGNOSIS — M79675 Pain in left toe(s): Secondary | ICD-10-CM

## 2020-02-10 NOTE — Progress Notes (Signed)
Subjective: Eric Stephens is a 39 y.o. male patient seen today painful mycotic nails b/l that are difficult to trim. Pain interferes with ambulation. Aggravating factors include wearing enclosed shoe gear. Pain is relieved with periodic professional debridement.  His father is present during today's visit. They voice no new pedal concerns on today's visit.  Patient Active Problem List   Diagnosis Date Noted  . HLD (hyperlipidemia) 07/23/2019  . Ear pain 04/08/2019  . Rash 01/10/2019  . Screening for hyperlipidemia 01/10/2019  . Need for Tdap vaccination 05/02/2018  . Need for immunization against influenza 05/02/2018  . Syphilis 11/20/2017  . Onychomycosis of toenail 11/01/2017  . Upper respiratory tract infection 07/03/2017  . Asthma 06/08/2017  . Rib pain on right side 03/09/2017  . Bilateral lower extremity edema 02/06/2017  . Obesity (BMI 30-39.9) 01/11/2017  . Anxiety 01/11/2017  . Insomnia 01/11/2017  . Neurosyphilis 01/08/2017  . 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 Catatonia 07/04/2016  . Schizophrenia (HCC) 02/04/2016    Current Outpatient Medications on File Prior to Visit  Medication Sig Dispense Refill  . brimonidine (ALPHAGAN) 0.2 % ophthalmic solution PLACE 1 DROP INTO RIGHT EYE TWICE DAILY    . cetirizine (ZYRTEC) 10 MG tablet TAKE 1 TABLET BY MOUTH EVERY DAY AS NEEDED FOR ALLERGY 90 tablet 1  . COMBIGAN 0.2-0.5 % ophthalmic solution INSTILL 1 DROP INTO BOTH EYES TWICE A DAY    . [employer] SALINE NOSE SPRAY 0.65 % nasal spray PLACE 1 SPRAY INTO BOTH NOSTRILS AS NEEDED FOR CONGESTION. 30 mL 0  . cyclobenzaprine (FLEXERIL) 10 MG tablet Take 1 tablet (10 mg total) by mouth 2 (two) times daily as needed for muscle spasms. 20 tablet 0  . diphenhydrAMINE (BENADRYL) 25 mg capsule Inject into the vein.    [name] gabapentin (NEURONTIN) 800 MG tablet TAKE 1 TABLET TWICE A DAY FOR ANXIETY AND IRRITABLITY    . Methylfol-Methylcob-Acetylcyst  (METAFOLBIC PLUS) 6-2-600 MG TABS Take by mouth.    . Olopatadine HCl 0.2 % SOLN Apply 1 drop to eye every morning.    . polyethylene glycol powder (GLYCOLAX/MIRALAX) 17 GM/SCOOP powder Take by mouth.    [name] PROVENTIL HFA 108 (90 Base) MCG/ACT inhaler TAKE 2 PUFFS BY MOUTH EVERY 6 HOURS AS NEEDED FOR WHEEZE OR SHORTNESS OF BREATH 6.7 Inhaler 1  . Pyridoxine HCl 200 MG TBCR Take by mouth.    . RESTASIS 0.05 % ophthalmic emulsion 1 drop 2 (two) times daily.    . vitamin E 180 MG (400 UNITS) capsule Take by mouth.    . zolpidem (AMBIEN) 10 MG tablet Take 10 mg by mouth at bedtime as needed.     No current facility-administered medications on file prior to visit.    No Known Allergies  Objective: Physical Exam  General: BRODRICK CURRAN is a pleasant 39 y.o.  AA male, WD, WN in NAD. AAO x 3.   Vascular:  Neurovascular status unchanged b/l. Capillary fill time to digits <3 seconds b/l. Palpable DP pulses b/l. Palpable PT pulses b/l. Pedal hair present b/l. Skin temperature gradient within normal limits b/l. No edema noted b/l.  Dermatological:  Pedal skin with normal turgor, texture and tone bilaterally. No open wounds bilaterally. No interdigital macerations bilaterally. Toenails 1-5 b/l elongated, dystrophic, thickened, crumbly with subungual debris and tenderness to dorsal palpation. Hyperkeratotic lesion(s) submet head 2 left foot and submet head 2 right foot.  No erythema, no edema, no drainage, no flocculence.  Musculoskeletal:  Normal muscle strength 5/5 to all lower extremity muscle groups bilaterally. No gross bony deformities bilaterally. No pain crepitus or joint limitation noted with ROM b/l. Hammertoes noted to the L 2nd toe and R 2nd toe.  Neurological:  Protective sensation intact 5/5 intact bilaterally with 10g monofilament b/l. Vibratory sensation intact b/l.  Assessment and Plan:  1. Pain due to onychomycosis of toenails of both feet   2. Polyneuropathy associated with  underlying disease (HCC)    -Examined patient. -No new findings. No new orders. -Toenails 1-5 b/l were debrided in length and girth with sterile nail nippers and dremel without iatrogenic bleeding.  -Patient to continue soft, supportive shoe gear daily. -Patient to report any pedal injuries to medical professional immediately. -Father refuses paring of calluses. Medicaid ABN on file for 2021. Patient was given copy on previous visit. -Patient/POA to call should there be question/concern in the interim.    Return in about 3 months (around 05/12/2020) for nail trim.  Freddie Breech, DPM

## 2020-02-10 NOTE — Chronic Care Management (AMB) (Signed)
  Care Management   Follow Up Note   02/10/2020 Name: Eric Stephens MRN: [id number] DOB: [birth year]  Referred by: Alphonzo Severance, MD Reason for referral : Care Coordination (Placement)   Eric Stephens is a 39 y.o. year old male who is a primary care patient of Alphonzo Severance, MD. The care management team was consulted for assistance with care management and care coordination needs.    Review of patient status, including review of consultants reports, relevant laboratory and other test results, and collaboration with appropriate care team members and the patient's provider was performed as part of comprehensive patient evaluation and provision of chronic care management services.    SDOH (Social Determinants of Health) assessments performed: No See Care Plan activities for detailed interventions related to [facility])     Advanced Directives: See Care Plan and Vynca application for related entries.   Goals Addressed              This Visit's Progress   .  Per Father/Legal Guardian, "He needs long term, supervised care." (pt-stated)        Current Barriers:  [name] Knowledge Barriers related to resources and support available to address needs related to Level of care concerns; placement.   Patient moved into [facility] on 12/02/19.  Patient only remained at ALF until 12/08/19 because father was contacted by Director stating that patient was "acting out" because he wanted to go home.  Father picked patient up that day and took him back home.   Case Manager Clinical Goal(s):  [name] Over the next 180 days, patient will work with BSW to address needs related to Level of care concerns; placement . Over the next 180 days, BSW will collaborate with RN Care Manager to address care management and care coordination needs  Interventions:  [name] Mailed letter to father/guardian with update on all facilities contacted thus far and reason for denials . Talked to Reyne Dumas, Admissions Coordinator at [facility] at [place]; no Medicaid beds available.  Diagnosis of dementia "with behavioral disturbance" likely to be a barrier at other [facility] facilities . Faxed FL 2 to [facility] for 3rd time; first two attempts on 8/19 were unsuccessful . Contacted [facility] to follow up on FL 2 that was faxed on 8/19.   [name] Faxed FL 2 to [place] at [facility] as she states it was not received last week . Attempted to contact [facility] for 2nd time; no answer or option to leave message  . Patient Self Care Activities:  . Attends all scheduled provider appointments  Please see past updates related to this goal by clicking on the "Past Updates" button in the selected goal          Will continue with efforts to locate long term placement for patient.     [name], BSW Embedded Care Coordination Social Worker [facility] [phone]

## 2020-02-10 NOTE — Patient Instructions (Signed)
Visit Information  Goals Addressed              This Visit's Progress     Per Father/Legal Guardian, "He needs long term, supervised care." (pt-stated)        Current Barriers:   Knowledge Barriers related to resources and support available to address needs related to Level of care concerns; placement.   Patient moved into [facility] on 12/02/19.  Patient only remained at ALF until 12/08/19 because father was contacted by Director stating that patient was "acting out" because he wanted to go home.  Father picked patient up that day and took him back home.   Case Manager Clinical Goal(s):   Over the next 180 days, patient will work with BSW to address needs related to Level of care concerns; placement  Over the next 180 days, BSW will collaborate with RN Care Manager to address care management and care coordination needs  Interventions:   Mailed letter to father/guardian with update on all facilities contacted thus far and reason for denials  Talked to Reyne Dumas, Admissions Coordinator at [facility] at [place]; no Medicaid beds available.  Diagnosis of dementia "with behavioral disturbance" likely to be a barrier at other [facility] facilities  Faxed FL 2 to [facility] for 3rd time; first two attempts on 8/19 were unsuccessful  Contacted [facility] to follow up on FL 2 that was faxed on 8/19.    Faxed FL 2 to [facility] as she states it was not received last week  Attempted to contact [facility] for 2nd time; no answer or option to leave message   Patient Self Care Activities:   Attends all scheduled provider appointments  Please see past updates related to this goal by clicking on the "Past Updates" button in the selected goal         Will continue with efforts to locate long term placement for patient.

## 2020-02-11 ENCOUNTER — Ambulatory Visit: Payer: Medicaid Other

## 2020-02-11 DIAGNOSIS — A523 Neurosyphilis, unspecified: Secondary | ICD-10-CM

## 2020-02-11 DIAGNOSIS — F0281 Dementia in other diseases classified elsewhere with behavioral disturbance: Secondary | ICD-10-CM

## 2020-02-11 DIAGNOSIS — F02818 Dementia in other diseases classified elsewhere, unspecified severity, with other behavioral disturbance: Secondary | ICD-10-CM

## 2020-02-11 NOTE — Progress Notes (Signed)
Internal Medicine Clinic Resident  I have personally reviewed this encounter including the documentation in this note and/or discussed this patient with the care management provider. I will address any urgent items identified by the care management provider and will communicate my actions to the patient's PCP. I have reviewed the patient's CCM visit with my supervising attending, Dr Vincent.  Money Mckeithan, MD 02/11/2020    

## 2020-02-11 NOTE — Chronic Care Management (AMB) (Signed)
  Care Management   Follow Up Note   02/11/2020 Name: Eric Stephens MRN: [id number] DOB: 06-02-1981  Referred by: Alphonzo Severance, MD Reason for referral : Care Coordination (Placement)   Eric Stephens is a 39 y.o. year old male who is a primary care patient of Alphonzo Severance, MD. The care management team was consulted for assistance with care management and care coordination needs.    Review of patient status, including review of consultants reports, relevant laboratory and other test results, and collaboration with appropriate care team members and the patient's provider was performed as part of comprehensive patient evaluation and provision of chronic care management services.    SDOH (Social Determinants of Health) assessments performed: No See Care Plan activities for detailed interventions related to [facility])     Advanced Directives: See Care Plan and Vynca application for related entries.   Goals Addressed              This Visit's Progress   .  Per Father/Legal Guardian, "He needs long term, supervised care." (pt-stated)        Current Barriers:  [name] Knowledge Barriers related to resources and support available to address needs related to Level of care concerns; placement.   Patient moved into [facility] on 12/02/19.  Patient only remained at ALF until 12/08/19 because father was contacted by Director stating that patient was "acting out" because he wanted to go home.  Father picked patient up that day and took him back home.   Case Manager Clinical Goal(s):  [name] Over the next 180 days, patient will work with BSW to address needs related to Level of care concerns; placement . Over the next 180 days, BSW will collaborate with RN Care Manager to address care management and care coordination needs  Interventions:  . Received return call from [facility] with [facility]; no beds currently available and patient "not appropriate for [facility] facilities".  Suggested group home  placement but father/guardian not open to this.  . Attempted to contact Admissions Department at [facility] (for third time) due to no response to message left on 8/18; no answer or option to leave message . Talked to [facility] with Admissions Department at [facility] Rehab; "not appropriate for facility"   . Patient Self Care Activities:  . Attends all scheduled provider appointments  Please see past updates related to this goal by clicking on the "Past Updates" button in the selected goal          Will continue seeking placement options for patient.      [name], BSW Embedded Care Coordination Social Worker [facility] Center [phone]

## 2020-02-11 NOTE — Patient Instructions (Signed)
Visit Information  Goals Addressed              This Visit's Progress   .  Per Father/Legal Guardian, "He needs long term, supervised care." (pt-stated)        Current Barriers:  [name] Knowledge Barriers related to resources and support available to address needs related to Level of care concerns; placement.   Patient moved into [facility] on 12/02/19.  Patient only remained at ALF until 12/08/19 because father was contacted by Director stating that patient was "acting out" because he wanted to go home.  Father picked patient up that day and took him back home.   Case Manager Clinical Goal(s):  [name] Over the next 180 days, patient will work with BSW to address needs related to Level of care concerns; placement . Over the next 180 days, BSW will collaborate with RN Care Manager to address care management and care coordination needs  Interventions:  . Received return call from [facility] with [facility]; no beds currently available and patient "not appropriate for [facility] facilities".  Suggested group home placement but father/guardian not open to this.  . Attempted to contact Admissions Department at [facility] (for third time) due to no response to message left on 8/18; no answer or option to leave message . Talked to [facility] with Admissions Department at [facility] Rehab; "not appropriate for facility"   . Patient Self Care Activities:  . Attends all scheduled provider appointments  Please see past updates related to this goal by clicking on the "Past Updates" button in the selected goal        Will continue seeking placement options for patient.      [name], BSW Embedded Care Coordination Social Worker [facility] Internal Medicine Center [phone]

## 2020-02-11 NOTE — Progress Notes (Signed)
Internal Medicine Clinic Resident  I have personally reviewed this encounter including the documentation in this note and/or discussed this patient with the care management provider. I will address any urgent items identified by the care management provider and will communicate my actions to the patient's PCP. I have reviewed the patient's CCM visit with my supervising attending, Dr Hoffman.  Pate Aylward, MD 02/11/2020    

## 2020-02-12 ENCOUNTER — Telehealth: Payer: Medicaid Other

## 2020-02-12 NOTE — Progress Notes (Signed)
Internal Medicine Clinic Attending  CCM services provided by the care management provider and their documentation were discussed with Dr. Basaraba. We reviewed the pertinent findings, urgent action items addressed by the resident and non-urgent items to be addressed by the PCP.  I agree with the assessment, diagnosis, and plan of care documented in the CCM and resident's note.  Ronesha Heenan Thomas Jonae Renshaw, MD 02/12/2020  

## 2020-02-16 ENCOUNTER — Telehealth: Payer: Medicaid Other

## 2020-02-17 ENCOUNTER — Ambulatory Visit: Payer: Medicaid Other

## 2020-02-17 DIAGNOSIS — A523 Neurosyphilis, unspecified: Secondary | ICD-10-CM

## 2020-02-17 DIAGNOSIS — F02818 Dementia in other diseases classified elsewhere, unspecified severity, with other behavioral disturbance: Secondary | ICD-10-CM

## 2020-02-17 DIAGNOSIS — F0281 Dementia in other diseases classified elsewhere with behavioral disturbance: Secondary | ICD-10-CM

## 2020-02-17 NOTE — Patient Instructions (Signed)
Visit Information  Goals Addressed              This Visit's Progress   .  Per Father/Legal Guardian, "He needs long term, supervised care." (pt-stated)        Current Barriers:  [name] Knowledge Barriers related to resources and support available to address needs related to Level of care concerns; placement.   Patient moved into [facility] on 12/02/19.  Patient only remained at ALF until 12/08/19 because father was contacted by Director stating that patient was "acting out" because he wanted to go home.  Father picked patient up that day and took him back home.   Case Manager Clinical Goal(s):  [name] Over the next 180 days, patient will work with BSW to address needs related to Level of care concerns; placement . Over the next 180 days, BSW will collaborate with RN Care Manager to address care management and care coordination needs  Interventions:  . In-basket message sent to [facility] Team requesting review/signature of new FL 2 as current form expires on 02/20/20  . Patient Self Care Activities:  . Attends all scheduled provider appointments  Please see past updates related to this goal by clicking on the "Past Updates" button in the selected goal        Will continue pursuing placement options for patient.      [name], BSW Embedded Care Coordination Social Worker [facility] Internal Medicine Center [phone]

## 2020-02-17 NOTE — Chronic Care Management (AMB) (Signed)
  Care Management   Follow Up Note   02/17/2020 Name: Eric Stephens MRN: [id number] DOB: [birth year]  Referred by: Alphonzo Severance, MD Reason for referral : Care Coordination (Placement)   Eric Stephens is a 39 y.o. year old male who is a primary care patient of Alphonzo Severance, MD. The care management team was consulted for assistance with care management and care coordination needs.    Review of patient status, including review of consultants reports, relevant laboratory and other test results, and collaboration with appropriate care team members and the patient's provider was performed as part of comprehensive patient evaluation and provision of chronic care management services.    SDOH (Social Determinants of Health) assessments performed: No See Care Plan activities for detailed interventions related to [facility])     Advanced Directives: See Care Plan and Vynca application for related entries.   Goals Addressed              This Visit's Progress   .  Per Father/Legal Guardian, "He needs long term, supervised care." (pt-stated)        Current Barriers:  [name] Knowledge Barriers related to resources and support available to address needs related to Level of care concerns; placement.   Patient moved into [facility] on 12/02/19.  Patient only remained at ALF until 12/08/19 because father was contacted by Director stating that patient was "acting out" because he wanted to go home.  Father picked patient up that day and took him back home.   Case Manager Clinical Goal(s):  [name] Over the next 180 days, patient will work with BSW to address needs related to Level of care concerns; placement . Over the next 180 days, BSW will collaborate with RN Care Manager to address care management and care coordination needs  Interventions:  . In-basket message sent to [facility] Team requesting review/signature of new FL 2 as current form expires on 02/20/20  . Patient Self Care Activities:  . Attends all  scheduled provider appointments  Please see past updates related to this goal by clicking on the "Past Updates" button in the selected goal         Will continue to pursue placement options for patient.    [name], BSW Embedded Care Coordination Social Worker [facility] Internal Medicine Center [phone]

## 2020-02-17 NOTE — Progress Notes (Signed)
Internal Medicine Clinic Resident  I have personally reviewed this encounter including the documentation in this note and/or discussed this patient with the care management provider. I will address any urgent items identified by the care management provider and will communicate my actions to the patient's PCP. I have reviewed the patient's CCM visit with my supervising attending, Dr Raines.  Khylie Larmore, MD 02/17/2020    

## 2020-02-18 ENCOUNTER — Telehealth: Payer: Medicaid Other

## 2020-02-18 NOTE — Progress Notes (Signed)
Internal Medicine Clinic Attending  CCM services provided by the care management provider and their documentation were reviewed with Dr. Jinwala.  We reviewed the pertinent findings, urgent action items addressed by the resident and non-urgent items to be addressed by the PCP.  I agree with the assessment, diagnosis, and plan of care documented in the CCM and resident's note.  Odarius Dines N Kaytelyn Glore, MD 02/18/2020  

## 2020-02-25 ENCOUNTER — Ambulatory Visit: Payer: Medicaid Other

## 2020-02-25 DIAGNOSIS — F0281 Dementia in other diseases classified elsewhere with behavioral disturbance: Secondary | ICD-10-CM

## 2020-02-25 DIAGNOSIS — F02818 Dementia in other diseases classified elsewhere, unspecified severity, with other behavioral disturbance: Secondary | ICD-10-CM

## 2020-02-25 DIAGNOSIS — A523 Neurosyphilis, unspecified: Secondary | ICD-10-CM

## 2020-02-25 NOTE — Patient Instructions (Signed)
Visit Information  Goals Addressed              This Visit's Progress   .  Per Father/Legal Guardian, "He needs long term, supervised care." (pt-stated)        Current Barriers:  [name] Knowledge Barriers related to resources and support available to address needs related to Level of care concerns; placement.   Patient moved into [facility] on 12/02/19.  Patient only remained at ALF until 12/08/19 because father was contacted by Director stating that patient was "acting out" because he wanted to go home.  Father picked patient up that day and took him back home.   Case Manager Clinical Goal(s):  [name] Over the next 180 days, patient will work with BSW to address needs related to Level of care concerns; placement . Over the next 180 days, BSW will collaborate with RN Care Manager to address care management and care coordination needs  Interventions:  . Received the following response from Hansel Feinstein with [facility] regarding request for placement.  ""I do not have any male availability at this time at [facility].  After review of Mr Purves and his diagnoses, I would be unable to offer to place him on our waiting list in our small AL. I am very concerned that he needs memory care due to behavioral disturbances and his psych dx need addressed.  Thank you for reaching out to me.  I sincerely understand the high difficulty in finding safe and appropriate placement for him." . Left message for Admissions Coordinator, Ms. Little, at [facility] and Rehab . Left second message for Marylene Land, Admissions Coordinator at [facility].  Faxed new FL 2 for review.   . Attempted to contact [facility] Admissions for second time-no answer or option to leave message.  No response to [facility] 2 faxed on 02/03/20  . Patient Self Care Activities:  . Attends all scheduled provider appointments  Please see past updates related to this goal by clicking on the "Past Updates" button in the selected goal          CCM  BSW continues to explore placement options for patient.     [name], BSW Embedded Care Coordination Social Worker [facility] Internal Medicine Center [phone]

## 2020-02-25 NOTE — Chronic Care Management (AMB) (Signed)
°  Care Management   Follow Up Note   02/25/2020 Name: Eric Stephens MRN: [id number] DOB: [birth year]  Referred by: Alphonzo Severance, MD Reason for referral : Care Coordination (Placement)   Eric Stephens is a 39 y.o. year old male who is a primary care patient of Alphonzo Severance, MD. The care management team was consulted for assistance with care management and care coordination needs.    Review of patient status, including review of consultants reports, relevant laboratory and other test results, and collaboration with appropriate care team members and the patient's provider was performed as part of comprehensive patient evaluation and provision of chronic care management services.    SDOH (Social Determinants of Health) assessments performed: No See Care Plan activities for detailed interventions related to [facility])     Advanced Directives: See Care Plan and Vynca application for related entries.   Goals Addressed              This Visit's Progress     Per Father/Legal Guardian, "He needs long term, supervised care." (pt-stated)        Current Barriers:   Knowledge Barriers related to resources and support available to address needs related to Level of care concerns; placement.   Patient moved into [facility] on 12/02/19.  Patient only remained at ALF until 12/08/19 because father was contacted by Director stating that patient was "acting out" because he wanted to go home.  Father picked patient up that day and took him back home.   Case Manager Clinical Goal(s):   Over the next 180 days, patient will work with BSW to address needs related to Level of care concerns; placement  Over the next 180 days, BSW will collaborate with RN Care Manager to address care management and care coordination needs  Interventions:   Received the following response from Joy Bishop with [facility] regarding request for placement.  "I do not have any male availability at this time at [facility].  After  review of Eric Stephens and his diagnoses, I would be unable to offer to place him on our waiting list in our small AL. I am very concerned that he needs memory care due to behavioral disturbances and his psych dx need addressed.  Thank you for reaching out to me.  I sincerely understand the high difficulty in finding safe and appropriate placement for him."  Left message for Admissions Coordinator, Ms. Little, at [facility]  Left second message for Marylene Land, Admissions Coordinator at [facility].  Faxed new FL 2 for review.    Attempted to contact [facility] Admissions for second time-no answer or option to leave message.  No response to [facility] 2 faxed on 02/03/20   Patient Self Care Activities:   Attends all scheduled provider appointments  Please see past updates related to this goal by clicking on the "Past Updates" button in the selected goal          CCM BSW continues to explore placement options for patient.     [name], BSW Embedded Care Coordination Social Worker [facility] Internal Medicine Center [phone]

## 2020-02-26 ENCOUNTER — Ambulatory Visit: Payer: Medicaid Other

## 2020-02-26 DIAGNOSIS — F0281 Dementia in other diseases classified elsewhere with behavioral disturbance: Secondary | ICD-10-CM

## 2020-02-26 DIAGNOSIS — F02818 Dementia in other diseases classified elsewhere, unspecified severity, with other behavioral disturbance: Secondary | ICD-10-CM

## 2020-02-26 DIAGNOSIS — A523 Neurosyphilis, unspecified: Secondary | ICD-10-CM

## 2020-02-27 NOTE — Patient Instructions (Signed)
Visit Information  Goals Addressed              This Visit's Progress   .  Per Father/Legal Guardian, "He needs long term, supervised care." (pt-stated)        Current Barriers:  [name] Knowledge Barriers related to resources and support available to address needs related to Level of care concerns; placement.   Patient moved into [facility] on 12/02/19.  Patient only remained at ALF until 12/08/19 because father was contacted by Director stating that patient was "acting out" because he wanted to go home.  Father picked patient up that day and took him back home.   Case Manager Clinical Goal(s):  [name] Over the next 180 days, patient will work with BSW to address needs related to Level of care concerns; placement . Over the next 180 days, BSW will collaborate with RN Care Manager to address care management and care coordination needs  Interventions:  . Faxed FL 2 to [facility] requesting response  . Spoke to [employer], Insurance claims handler for [employer] [facility] [place].  Faxed FL 2 for review; unable to offer admission but did not provide reasone . Faxed FL 2 to [facility] for review-received response from Admissions Director that no beds are available.  Arneta Cliche FL to [facility] for review  . Patient Self Care Activities:  . Attends all scheduled provider appointments  Please see past updates related to this goal by clicking on the "Past Updates" button in the selected goal         Will continue to explore placement options for patient.    [name], BSW Embedded Care Coordination Social Worker [facility] Internal Medicine Center [phone]

## 2020-02-27 NOTE — Chronic Care Management (AMB) (Signed)
  Care Management   Follow Up Note   02/27/2020 Name: Eric Stephens MRN: [id number] DOB: [birth year]  Referred by: Alphonzo Severance, MD Reason for referral : Care Coordination (Placement)   Eric Stephens is a 38 y.o. year old male who is a primary care patient of Alphonzo Severance, MD. The care management team was consulted for assistance with care management and care coordination needs.    Review of patient status, including review of consultants reports, relevant laboratory and other test results, and collaboration with appropriate care team members and the patient's provider was performed as part of comprehensive patient evaluation and provision of chronic care management services.    SDOH (Social Determinants of Health) assessments performed: No See Care Plan activities for detailed interventions related to [facility])     Advanced Directives: See Care Plan and Vynca application for related entries.   Goals Addressed              This Visit's Progress   .  Per Father/Legal Guardian, "He needs long term, supervised care." (pt-stated)        Current Barriers:  [name] Knowledge Barriers related to resources and support available to address needs related to Level of care concerns; placement.   Patient moved into [facility] on 12/02/19.  Patient only remained at ALF until 12/08/19 because father was contacted by Director stating that patient was "acting out" because he wanted to go home.  Father picked patient up that day and took him back home.   Case Manager Clinical Goal(s):  [name] Over the next 180 days, patient will work with BSW to address needs related to Level of care concerns; placement . Over the next 180 days, BSW will collaborate with RN Care Manager to address care management and care coordination needs  Interventions:  . Faxed FL 2 to [facility] requesting response  . Spoke to [employer], Insurance claims handler for [facility] [place].  Faxed FL 2 for review; unable to  offer admission but did not provide reasone . Faxed FL 2 to [facility] for review-received response from Admissions Director that no beds are available.  Arneta Cliche FL to [facility] for review  . Patient Self Care Activities:  . Attends all scheduled provider appointments  Please see past updates related to this goal by clicking on the "Past Updates" button in the selected goal          Will continue to explore potential placement options for patient.    [name], BSW Embedded Care Coordination Social Worker [facility] Internal Medicine Center [phone]

## 2020-03-03 ENCOUNTER — Ambulatory Visit: Payer: Medicaid Other

## 2020-03-03 DIAGNOSIS — F02818 Dementia in other diseases classified elsewhere, unspecified severity, with other behavioral disturbance: Secondary | ICD-10-CM

## 2020-03-03 DIAGNOSIS — A523 Neurosyphilis, unspecified: Secondary | ICD-10-CM

## 2020-03-03 DIAGNOSIS — F0281 Dementia in other diseases classified elsewhere with behavioral disturbance: Secondary | ICD-10-CM

## 2020-03-03 NOTE — Chronic Care Management (AMB) (Signed)
  Care Management   Follow Up Note   03/03/2020 Name: Eric Stephens MRN: [id number] DOB: Aug 25, 1980  Referred by: Eric Severance, MD Reason for referral : Care Coordination (placement)   Eric Stephens is a 39 y.o. year old male who is a primary care patient of Eric Severance, MD. The care management team was consulted for assistance with care management and care coordination needs.    Review of patient status, including review of consultants reports, relevant laboratory and other test results, and collaboration with appropriate care team members and the patient's provider was performed as part of comprehensive patient evaluation and provision of chronic care management services.    SDOH (Social Determinants of Health) assessments performed: No See Care Plan activities for detailed interventions related to [facility])     Advanced Directives: See Care Plan and Vynca application for related entries.   Goals Addressed              This Visit's Progress   .  Per Father/Legal Guardian, "He needs long term, supervised care." (pt-stated)        Current Barriers:  [name] Knowledge Barriers related to resources and support available to address needs related to Level of care concerns; placement.   Patient moved into [facility] on 12/02/19.  Patient only remained at ALF until 12/08/19 because father was contacted by Director stating that patient was "acting out" because he wanted to go home.  Father picked patient up that day and took him back home. Father requesting placement in facility with memory care/dementia unit.  Case Manager Clinical Goal(s):  [name] Over the next 180 days, patient will work with BSW to address needs related to Level of care concerns; placement . Over the next 180 days, BSW will collaborate with RN Care Manager to address care management and care coordination needs  Interventions:  [name] Mailed letter to father/legal guardian with the following update on efforts to locate placement: The  following facilities have denied placement due to no bed availability or deeming inappropriate placement option: [facility], [facility], [employer], [facility], [facility]   The following facilities have been contacted on multiple occasions via phone and/or fax with no return call or follow up: [facility], [facility], [facility], [employer], [facility], [facility], [facility],   . Patient Self Care Activities:  . Attends all scheduled provider appointments  Please see past updates related to this goal by clicking on the "Past Updates" button in the selected goal          A HIPAA compliant phone message was left for the patient's father/legal guardian providing contact information and requesting a return call.      Eric Stephens, BSW Embedded Care Coordination Social Worker [facility] Internal Medicine Center [phone]

## 2020-03-03 NOTE — Progress Notes (Signed)
Internal Medicine Clinic Resident  I have personally reviewed this encounter including the documentation in this note and/or discussed this patient with the care management provider. I will address any urgent items identified by the care management provider and will communicate my actions to the patient's PCP. I have reviewed the patient's CCM visit with my supervising attending, Dr Raines.  Matt Kanika Bungert, MD 03/03/2020   

## 2020-03-03 NOTE — Patient Instructions (Signed)
Visit Information  Goals Addressed              This Visit's Progress   .  Per Father/Legal Guardian, "He needs long term, supervised care." (pt-stated)        Current Barriers:  [name] Knowledge Barriers related to resources and support available to address needs related to Level of care concerns; placement.   Patient moved into [facility] on 12/02/19.  Patient only remained at ALF until 12/08/19 because father was contacted by Director stating that patient was "acting out" because he wanted to go home.  Father picked patient up that day and took him back home. Father requesting placement in facility with memory care/dementia unit.  Case Manager Clinical Goal(s):  [name] Over the next 180 days, patient will work with BSW to address needs related to Level of care concerns; placement . Over the next 180 days, BSW will collaborate with RN Care Manager to address care management and care coordination needs  Interventions:  [name] Mailed letter to father/legal guardian with the following update on efforts to locate placement: The following facilities have denied placement due to no bed availability or deeming inappropriate placement option: [facility], [facility], [facility], [facility], [facility]   The following facilities have been contacted on multiple occasions via phone and/or fax with no return call or follow up: [facility], [facility], [facility], [facility], [facility], [facility], [facility],   . Patient Self Care Activities:  . Attends all scheduled provider appointments  Please see past updates related to this goal by clicking on the "Past Updates" button in the selected goal           A HIPAA compliant phone message was left for the patient's father/legal guardian  providing contact information and requesting a return call.       [name], BSW Embedded Care Coordination Social Worker [facility] Medicine Center [phone]

## 2020-03-08 ENCOUNTER — Ambulatory Visit: Payer: Medicaid Other

## 2020-03-08 DIAGNOSIS — A523 Neurosyphilis, unspecified: Secondary | ICD-10-CM

## 2020-03-08 DIAGNOSIS — F0281 Dementia in other diseases classified elsewhere with behavioral disturbance: Secondary | ICD-10-CM

## 2020-03-08 DIAGNOSIS — F02818 Dementia in other diseases classified elsewhere, unspecified severity, with other behavioral disturbance: Secondary | ICD-10-CM

## 2020-03-08 NOTE — Patient Instructions (Signed)
Visit Information  Goals Addressed              This Visit's Progress     Per Father/Legal Guardian, "He needs long term, supervised care." (pt-stated)        Current Barriers:   Knowledge Barriers related to resources and support available to address needs related to Level of care concerns; placement.   Patient moved into [facility] on 12/02/19.  Patient only remained at ALF until 12/08/19 because father was contacted by Director stating that patient was "acting out" because he wanted to go home.  Father picked patient up that day and took him back home. Father requesting placement in facility with memory care/dementia unit.  Case Manager Clinical Goal(s):   Over the next 180 days, patient will work with BSW to address needs related to Level of care concerns; placement  Over the next 180 days, BSW will collaborate with RN Care Manager to address care management and care coordination needs  Interventions:   Ensured that father/legal guardian has received letters sent with updates on efforts to locate placement  Ensured that father/legal guardian received application from [facility]  Encouraged father/legal guardian to complete/submit application as soon as possible due to them having a wait list.   Informed father that many placement options have been exhausted and it may not be possible to locate the desired level of care/facility type that he is requesting   Patient Self Care Activities:   Attends all scheduled provider appointments  Please see past updates related to this goal by clicking on the "Past Updates" button in the selected goal          The patient's father/legal guardian has been provided with contact information for the care management team and has been advised to call with any health related questions or concerns.     [name], BSW Embedded Care Coordination Social Worker [facility] Internal Medicine Center [phone]

## 2020-03-08 NOTE — Chronic Care Management (AMB) (Signed)
  Care Management   Follow Up Note   03/08/2020 Name: Eric Stephens MRN: [id number] DOB: May 05, 1981  Referred by: Alphonzo Severance, MD Reason for referral : Care Coordination (placement)   ANOTHONY Stephens is a 39 y.o. year old male who is a primary care patient of Alphonzo Severance, MD. The care management team was consulted for assistance with care management and care coordination needs.    Review of patient status, including review of consultants reports, relevant laboratory and other test results, and collaboration with appropriate care team members and the patient's provider was performed as part of comprehensive patient evaluation and provision of chronic care management services.    SDOH (Social Determinants of Health) assessments performed: No See Care Plan activities for detailed interventions related to [facility])     Advanced Directives: See Care Plan and Vynca application for related entries.   Goals Addressed              This Visit's Progress   .  Per Father/Legal Guardian, "He needs long term, supervised care." (pt-stated)        Current Barriers:  [name] Knowledge Barriers related to resources and support available to address needs related to Level of care concerns; placement.   Patient moved into [facility] on 12/02/19.  Patient only remained at ALF until 12/08/19 because father was contacted by Director stating that patient was "acting out" because he wanted to go home.  Father picked patient up that day and took him back home. Father requesting placement in facility with memory care/dementia unit.  Case Manager Clinical Goal(s):  [name] Over the next 180 days, patient will work with BSW to address needs related to Level of care concerns; placement . Over the next 180 days, BSW will collaborate with RN Care Manager to address care management and care coordination needs  Interventions:  . Ensured that father/legal guardian has received letters sent with updates on efforts to locate  placement . Ensured that father/legal guardian received application from [facility] . Encouraged father/legal guardian to complete/submit application as soon as possible due to them having a wait list.  . Informed father that many placement options have been exhausted and it may not be possible to locate the desired level of care/facility type that he is requesting  . Patient Self Care Activities:  . Attends all scheduled provider appointments  Please see past updates related to this goal by clicking on the "Past Updates" button in the selected goal          The patient's father/legal guardian has been provided with contact information for the care management team and has been advised to call with any health related questions or concerns.     Eric Stephens, BSW Embedded Care Coordination Social Worker [facility] Internal Medicine Center [phone]

## 2020-03-09 ENCOUNTER — Telehealth: Payer: Medicaid Other

## 2020-03-18 ENCOUNTER — Ambulatory Visit: Payer: Medicaid Other

## 2020-03-18 DIAGNOSIS — F02818 Dementia in other diseases classified elsewhere, unspecified severity, with other behavioral disturbance: Secondary | ICD-10-CM

## 2020-03-18 DIAGNOSIS — F0281 Dementia in other diseases classified elsewhere with behavioral disturbance: Secondary | ICD-10-CM

## 2020-03-18 DIAGNOSIS — A523 Neurosyphilis, unspecified: Secondary | ICD-10-CM

## 2020-03-18 NOTE — Progress Notes (Signed)
Internal Medicine Clinic Attending  CCM services provided by the care management provider and their documentation were discussed with Dr. Agyei. We reviewed the pertinent findings, urgent action items addressed by the resident and non-urgent items to be addressed by the PCP.  I agree with the assessment, diagnosis, and plan of care documented in the CCM and resident's note.  Adison Reifsteck, MD 03/18/2020  

## 2020-03-18 NOTE — Patient Instructions (Signed)
Visit Information  Goals Addressed              This Visit's Progress   .  Per Father/Legal Guardian, "He needs long term, supervised care." (pt-stated)        Current Barriers:  [name] Knowledge Barriers related to resources and support available to address needs related to Level of care concerns; placement.   Patient moved into [facility] on 12/02/19.  Patient only remained at ALF until 12/08/19 because father was contacted by Director stating that patient was "acting out" because he wanted to go home.  Father picked patient up that day and took him back home. Father requesting placement in facility with memory care/dementia unit.  Case Manager Clinical Goal(s):  [name] Over the next 180 days, patient will work with BSW to address needs related to Level of care concerns; placement . Over the next 180 days, BSW will collaborate with RN Care Manager to address care management and care coordination needs  Interventions:  . New FL 2 completed and placed in [facility] Team box for review/signature . In-basket message sent to team   . Patient Self Care Activities:  . Attends all scheduled provider appointments  Please see past updates related to this goal by clicking on the "Past Updates" button in the selected goal           The patient's guaridan has been provided with contact information for the care management team and has been advised to call with any health related questions or concerns.       [name], BSW Embedded Care Coordination Social Worker [facility] Internal Medicine Center [phone]

## 2020-03-18 NOTE — Chronic Care Management (AMB) (Signed)
  Care Management   Follow Up Note   03/18/2020 Name: Eric Stephens MRN: [id number] DOB: 01-21-1981  Referred by: Alphonzo Severance, MD Reason for referral : Care Coordination [facility])   Eric Stephens is a 39 y.o. year old male who is a primary care patient of Alphonzo Severance, MD. The care management team was consulted for assistance with care management and care coordination needs.    Review of patient status, including review of consultants reports, relevant laboratory and other test results, and collaboration with appropriate care team members and the patient's provider was performed as part of comprehensive patient evaluation and provision of chronic care management services.    SDOH (Social Determinants of Health) assessments performed: No See Care Plan activities for detailed interventions related to [facility])     Advanced Directives: See Care Plan and Vynca application for related entries.   Goals Addressed              This Visit's Progress   .  Per Father/Legal Guardian, "He needs long term, supervised care." (pt-stated)        Current Barriers:  [name] Knowledge Barriers related to resources and support available to address needs related to Level of care concerns; placement.   Patient moved into [facility] on 12/02/19.  Patient only remained at ALF until 12/08/19 because father was contacted by Director stating that patient was "acting out" because he wanted to go home.  Father picked patient up that day and took him back home. Father requesting placement in facility with memory care/dementia unit.  Case Manager Clinical Goal(s):  [name] Over the next 180 days, patient will work with BSW to address needs related to Level of care concerns; placement . Over the next 180 days, BSW will collaborate with RN Care Manager to address care management and care coordination needs  Interventions:  . New FL 2 completed and placed in [facility] Team box for review/signature . In-basket message sent to team     . Patient Self Care Activities:  . Attends all scheduled provider appointments  Please see past updates related to this goal by clicking on the "Past Updates" button in the selected goal          The patient's guardian has been provided with contact information for the care management team and has been advised to call with any health/community resource related questions or concerns.     [name], BSW Embedded Care Coordination Social Worker [facility] Internal Medicine Center [phone]

## 2020-03-18 NOTE — Progress Notes (Signed)
Internal Medicine Clinic Resident  I have personally reviewed this encounter including the documentation in this note and/or discussed this patient with the care management provider. I will address any urgent items identified by the care management provider and will communicate my actions to the patient's PCP. I have reviewed the patient's CCM visit with my supervising attending, Dr Narendra.  Shaylon Aden K Yoav Okane, MD 03/18/2020   

## 2020-03-26 ENCOUNTER — Ambulatory Visit: Payer: Medicaid Other

## 2020-03-26 DIAGNOSIS — F02818 Dementia in other diseases classified elsewhere, unspecified severity, with other behavioral disturbance: Secondary | ICD-10-CM

## 2020-03-26 DIAGNOSIS — F0281 Dementia in other diseases classified elsewhere with behavioral disturbance: Secondary | ICD-10-CM

## 2020-03-26 DIAGNOSIS — A523 Neurosyphilis, unspecified: Secondary | ICD-10-CM

## 2020-03-26 NOTE — Patient Instructions (Signed)
Visit Information  Goals Addressed              This Visit's Progress   .  Per Father/Legal Guardian, "He needs long term, supervised care." (pt-stated)        Current Barriers:  [name] Knowledge Barriers related to resources and support available to address needs related to Level of care concerns; placement.   Patient moved into [facility] on 12/02/19.  Patient only remained at ALF until 12/08/19 because father was contacted by Director stating that patient was "acting out" because he wanted to go home.  Father picked patient up that day and took him back home. Father requesting placement in facility with memory care/dementia unit.  Case Manager Clinical Goal(s):  [name] Over the next 180 days, patient will work with BSW to address needs related to Level of care concerns; placement . Over the next 180 days, BSW will collaborate with RN Care Manager to address care management and care coordination needs  Interventions:  . Received updated FL 2 from provider . Attempted to contact Admissions Director at [facility] but out of office; told to call back on 03/29/20. [name] Left message for Admissions Director with [facility] requesting return call . Left message for Nile Riggs with [facility] requesting call back . Attempted to contact Virgia Land, Director of Admissions, with [facility].  Unable to leave message due to mailbox being full.  Will attempt to reach again next week . Left message with Admissions Director for [facility] requesting call back   . Patient Self Care Activities:  . Attends all scheduled provider appointments  Please see past updates related to this goal by clicking on the "Past Updates" button in the selected goal           The patient's father has been provided with contact information for the care management team and has been advised to call with any health related questions or concerns.       [name],  BSW Embedded Care Coordination Social Worker [facility] Center [phone]

## 2020-03-26 NOTE — Progress Notes (Addendum)
Internal Medicine Clinic Resident  I have personally reviewed this encounter including the documentation in this note and/or discussed this patient with the care management provider. I will address any urgent items identified by the care management provider and will communicate my actions to the patient's PCP. I have reviewed the patient's CCM visit with my supervising attending, Dr Williams.  Sharay Bellissimo, MD  [facility] PGY-2 03/26/2020    Internal Medicine Attending attestation: I have reviewed and agree with the documented assessment and plan of this CCM visit.   Julie Williams, MD    

## 2020-03-26 NOTE — Chronic Care Management (AMB) (Signed)
  Care Management   Follow Up Note   03/26/2020 Name: Eric Stephens MRN: [id number] DOB: 22-Nov-1980  Referred by: Alphonzo Severance, MD Reason for referral : Care Coordination (placement)   Eric Stephens is a 39 y.o. year old male who is a primary care patient of Alphonzo Severance, MD. The care management team was consulted for assistance with care management and care coordination needs.    Review of patient status, including review of consultants reports, relevant laboratory and other test results, and collaboration with appropriate care team members and the patient's provider was performed as part of comprehensive patient evaluation and provision of chronic care management services.    SDOH (Social Determinants of Health) assessments performed: No See Care Plan activities for detailed interventions related to [facility])     Advanced Directives: See Care Plan and Vynca application for related entries.   Goals Addressed              This Visit's Progress   .  Per Father/Legal Guardian, "He needs long term, supervised care." (pt-stated)        Current Barriers:  [name] Knowledge Barriers related to resources and support available to address needs related to Level of care concerns; placement.   Patient moved into [facility] on 12/02/19.  Patient only remained at ALF until 12/08/19 because father was contacted by Director stating that patient was "acting out" because he wanted to go home.  Father picked patient up that day and took him back home. Father requesting placement in facility with memory care/dementia unit.  Case Manager Clinical Goal(s):  [name] Over the next 180 days, patient will work with BSW to address needs related to Level of care concerns; placement . Over the next 180 days, BSW will collaborate with RN Care Manager to address care management and care coordination needs  Interventions:  . Received updated FL 2 from provider . Attempted to contact Admissions Director at [facility] but  out of office; told to call back on 03/29/20. [name] Left message for Admissions Director with [facility] requesting return call . Left message for Nile Riggs with [facility] requesting call back . Attempted to contact Virgia Land, Director of Admissions, with [facility].  Unable to leave message due to mailbox being full.  Will attempt to reach again next week . Left message with Admissions Director for [facility] requesting call back   . Patient Self Care Activities:  . Attends all scheduled provider appointments  Please see past updates related to this goal by clicking on the "Past Updates" button in the selected goal          The patient's father has been provided with contact information for the care management team and has been advised to call with any health related questions or concerns.     [name], BSW Embedded Care Coordination Social Worker [facility] Center [phone]

## 2020-03-29 ENCOUNTER — Ambulatory Visit: Payer: Medicaid Other

## 2020-03-29 DIAGNOSIS — F02818 Dementia in other diseases classified elsewhere, unspecified severity, with other behavioral disturbance: Secondary | ICD-10-CM

## 2020-03-29 DIAGNOSIS — A523 Neurosyphilis, unspecified: Secondary | ICD-10-CM

## 2020-03-29 DIAGNOSIS — F0281 Dementia in other diseases classified elsewhere with behavioral disturbance: Secondary | ICD-10-CM

## 2020-03-29 NOTE — Chronic Care Management (AMB) (Signed)
  Care Management   Follow Up Note   03/29/2020 Name: BRENTLEE DELAGE MRN: [id number] DOB: 1980/12/18  Referred by: Alphonzo Severance, MD Reason for referral : Care Coordination (placement)   DUQUAN GILLOOLY is a 39 y.o. year old male who is a primary care patient of Alphonzo Severance, MD. The care management team was consulted for assistance with care management and care coordination needs.    Review of patient status, including review of consultants reports, relevant laboratory and other test results, and collaboration with appropriate care team members and the patient's provider was performed as part of comprehensive patient evaluation and provision of chronic care management services.    SDOH (Social Determinants of Health) assessments performed: No See Care Plan activities for detailed interventions related to [facility])     Advanced Directives: See Care Plan and Vynca application for related entries.   Goals Addressed              This Visit's Progress   .  Per Father/Legal Guardian, "He needs long term, supervised care." (pt-stated)        Current Barriers:  [name] Knowledge Barriers related to resources and support available to address needs related to Level of care concerns; placement.   Patient moved into [facility] on 12/02/19.  Patient only remained at ALF until 12/08/19 because father was contacted by Director stating that patient was "acting out" because he wanted to go home.  Father picked patient up that day and took him back home. Father requesting placement in facility with memory care/dementia unit.  Case Manager Clinical Goal(s):  [name] Over the next 180 days, patient will work with BSW to address needs related to Level of care concerns; placement . Over the next 180 days, BSW will collaborate with RN Care Manager to address care management and care coordination needs  Interventions:  . Spoke with representative at [facility].  Facility has memory care and accepts Medicaid/Special  Assistance.   Faxed FL 2 and medical records to attention of [facility] [phone]) . Received response from [facility] with [facility] that they have no Memory Care/Medicaid beds available at this time.  . Left message for [facility] Admissions requesting return call   . Patient Self Care Activities:  . Attends all scheduled provider appointments  Please see past updates related to this goal by clicking on the "Past Updates" button in the selected goal          The patient's father/guardian has been provided with contact information for the care management team and has been advised to call with any health related questions or concerns.     [name], BSW Embedded Care Coordination Social Worker [facility] Internal Medicine Center [phone]

## 2020-03-29 NOTE — Patient Instructions (Signed)
Visit Information  Goals Addressed              This Visit's Progress   .  Per Father/Legal Guardian, "He needs long term, supervised care." (pt-stated)        Current Barriers:  [name] Knowledge Barriers related to resources and support available to address needs related to Level of care concerns; placement.   Patient moved into [facility] on 12/02/19.  Patient only remained at ALF until 12/08/19 because father was contacted by Director stating that patient was "acting out" because he wanted to go home.  Father picked patient up that day and took him back home. Father requesting placement in facility with memory care/dementia unit.  Case Manager Clinical Goal(s):  [name] Over the next 180 days, patient will work with BSW to address needs related to Level of care concerns; placement . Over the next 180 days, BSW will collaborate with RN Care Manager to address care management and care coordination needs  Interventions:  . Spoke with representative at [facility].  Facility has memory care and accepts Medicaid/Special Assistance.   Faxed FL 2 and medical records to attention of [facility] [phone]) . Received response from [facility] with [facility] that they have no Memory Care/Medicaid beds available at this time.  . Left message for [facility] Admissions requesting return call   . Patient Self Care Activities:  . Attends all scheduled provider appointments  Please see past updates related to this goal by clicking on the "Past Updates" button in the selected goal           The patient's father/legal guardian has been provided with contact information for the care management team and has been advised to call with any health related questions or concerns.      [name], BSW Embedded Care Coordination Social Worker [facility] Internal Medicine Center [phone]

## 2020-03-30 ENCOUNTER — Other Ambulatory Visit: Payer: Self-pay

## 2020-03-30 ENCOUNTER — Ambulatory Visit: Payer: Medicaid Other

## 2020-03-30 DIAGNOSIS — F02818 Dementia in other diseases classified elsewhere, unspecified severity, with other behavioral disturbance: Secondary | ICD-10-CM

## 2020-03-30 DIAGNOSIS — F0281 Dementia in other diseases classified elsewhere with behavioral disturbance: Secondary | ICD-10-CM

## 2020-03-30 DIAGNOSIS — A523 Neurosyphilis, unspecified: Secondary | ICD-10-CM

## 2020-03-30 MED ORDER — CETIRIZINE HCL 10 MG PO TABS: ORAL_TABLET | ORAL | 1 refills | Status: DC

## 2020-03-30 NOTE — Progress Notes (Signed)
Internal Medicine Clinic Resident  I have personally reviewed this encounter including the documentation in this note and/or discussed this patient with the care management provider. I will address any urgent items identified by the care management provider and will communicate my actions to the patient's PCP. I have reviewed the patient's CCM visit with my supervising attending, Dr Raines.  Eric Clapper D Robinson Brinkley, DO 03/30/2020    

## 2020-03-30 NOTE — Patient Instructions (Signed)
Visit Information  Goals Addressed              This Visit's Progress   .  Per Father/Legal Guardian, "He needs long term, supervised care." (pt-stated)        Current Barriers:  [name] Knowledge Barriers related to resources and support available to address needs related to Level of care concerns; placement.   Patient moved into [facility] on 12/02/19.  Patient only remained at ALF until 12/08/19 because father was contacted by Director stating that patient was "acting out" because he wanted to go home.  Father picked patient up that day and took him back home. Father requesting placement in facility with memory care/dementia unit.  Case Manager Clinical Goal(s):  [name] Over the next 180 days, patient will work with BSW to address needs related to Level of care concerns; placement . Over the next 180 days, BSW will collaborate with RN Care Manager to address care management and care coordination needs  Interventions:  . Left message with [facility] Executive Director, Kandy Garrison, inquiring if documents faxed yesterday were received.  Did not receive fax confirmation.   . Second call to [facility]; no Medicaid beds available. Arneta Cliche [place] 2/medical records to [facility] [phone]) . Spoke to Richard/Admissions for [facility] not accept Medicaid . Contacted [facility] [place] not accept [employer]    . Patient Self Care Activities:  . Attends all scheduled provider appointments  Please see past updates related to this goal by clicking on the "Past Updates" button in the selected goal           The patient's father/guardian has been provided with contact information for the care management team and has been advised to call with any health related questions or concerns.     Eric Stephens, BSW Embedded Care Coordination Social Worker [facility] [phone]

## 2020-03-30 NOTE — Progress Notes (Signed)
Internal Medicine Clinic Resident  I have personally reviewed this encounter including the documentation in this note and/or discussed this patient with the care management provider. I will address any urgent items identified by the 3care management provider and will communicate my actions to the patient's PCP. I have reviewed the patient's CCM visit with my supervising attending, Dr Sandre Kitty.  Bridget Hartshorn, DO 03/30/2020

## 2020-03-30 NOTE — Chronic Care Management (AMB) (Signed)
  Care Management   Follow Up Note   03/30/2020 Name: Eric Stephens MRN: [id number] DOB: 08/03/1980  Referred by: Alphonzo Severance, MD Reason for referral : Care Coordination (placement)   Eric Stephens is a 39 y.o. year old male who is a primary care patient of Alphonzo Severance, MD. The care management team was consulted for assistance with care management and care coordination needs.    Review of patient status, including review of consultants reports, relevant laboratory and other test results, and collaboration with appropriate care team members and the patient's provider was performed as part of comprehensive patient evaluation and provision of chronic care management services.    SDOH (Social Determinants of Health) assessments performed: No See Care Plan activities for detailed interventions related to [facility])     Advanced Directives: See Care Plan and Vynca application for related entries.   Goals Addressed              This Visit's Progress   .  Per Father/Legal Guardian, "He needs long term, supervised care." (pt-stated)        Current Barriers:  [name] Knowledge Barriers related to resources and support available to address needs related to Level of care concerns; placement.   Patient moved into [facility] on 12/02/19.  Patient only remained at ALF until 12/08/19 because father was contacted by Director stating that patient was "acting out" because he wanted to go home.  Father picked patient up that day and took him back home. Father requesting placement in facility with memory care/dementia unit.  Case Manager Clinical Goal(s):  [name] Over the next 180 days, patient will work with BSW to address needs related to Level of care concerns; placement . Over the next 180 days, BSW will collaborate with RN Care Manager to address care management and care coordination needs  Interventions:  . Left message with [facility] Executive Director, Kandy Garrison, inquiring if documents faxed  yesterday were received.  Did not receive fax confirmation.   . Second call to [facility]; no Medicaid beds available. Arneta Cliche [place] 2/medical records to [facility] [phone]) . Spoke to Richard/Admissions for [facility] not accept Medicaid . Contacted [facility] [place] not accept [employer]    . Patient Self Care Activities:  . Attends all scheduled provider appointments  Please see past updates related to this goal by clicking on the "Past Updates" button in the selected goal          The patient's father/guardian has been provided with contact information for the care management team and has been advised to call with any health related questions or concerns.     [name], BSW Embedded Care Coordination Social Worker [facility] [phone]

## 2020-03-31 NOTE — Progress Notes (Signed)
Internal Medicine Clinic Attending  CCM services provided by the care management provider and their documentation were reviewed with Dr. Bloomfield.  We reviewed the pertinent findings, urgent action items addressed by the resident and non-urgent items to be addressed by the PCP.  I agree with the assessment, diagnosis, and plan of care documented in the CCM and resident's note.  Kari Montero N Steel Kerney, MD 03/31/2020  

## 2020-03-31 NOTE — Progress Notes (Signed)
Internal Medicine Clinic Attending  CCM services provided by the care management provider and their documentation were reviewed with Dr. Bloomfield.  We reviewed the pertinent findings, urgent action items addressed by the resident and non-urgent items to be addressed by the PCP.  I agree with the assessment, diagnosis, and plan of care documented in the CCM and resident's note.  Goddess Gebbia N Danton Palmateer, MD 03/31/2020  

## 2020-04-01 ENCOUNTER — Encounter: Payer: Self-pay | Admitting: Student

## 2020-04-01 ENCOUNTER — Ambulatory Visit: Payer: Medicaid Other | Admitting: Student

## 2020-04-01 ENCOUNTER — Other Ambulatory Visit: Payer: Self-pay

## 2020-04-01 ENCOUNTER — Ambulatory Visit: Payer: Medicaid Other

## 2020-04-01 VITALS — BP 102/77 | HR 72 | Temp 98.4°F | Ht 71.0 in | Wt 258.6 lb

## 2020-04-01 DIAGNOSIS — F02818 Dementia in other diseases classified elsewhere, unspecified severity, with other behavioral disturbance: Secondary | ICD-10-CM

## 2020-04-01 DIAGNOSIS — F0281 Dementia in other diseases classified elsewhere with behavioral disturbance: Secondary | ICD-10-CM

## 2020-04-01 DIAGNOSIS — N3944 Nocturnal enuresis: Secondary | ICD-10-CM

## 2020-04-01 DIAGNOSIS — A523 Neurosyphilis, unspecified: Secondary | ICD-10-CM

## 2020-04-01 NOTE — Patient Instructions (Addendum)
It was a pleasure seeing you in clinic. Today we discussed:   Urinary incontinence: Your bladder scan looked good today are you are not retaining urine. We will check your urine if see if that may be causing to you having to urinate at night more frequently. In addition at home try to stop drinking at around 8 pm or 2 hours before bedtime. Before going to bed try to use the bathroom a couple of times to make sure you are emptying you bladder as well. I will call you if the results of you urine test is abnormal.   If you have any questions or concerns, please call our clinic at [phone] between 9am-5pm and after hours call [phone] and ask for the internal medicine resident on call. If you feel you are having a medical emergency please call 911.   Thank you, we look forward to helping you remain healthy!

## 2020-04-01 NOTE — Patient Instructions (Signed)
Visit Information  Goals Addressed              This Visit's Progress   .  Per Father/Legal Guardian, "He needs long term, supervised care." (pt-stated)        Current Barriers:  [name] Knowledge Barriers related to resources and support available to address needs related to Level of care concerns; placement.   Patient moved into [facility] on 12/02/19.  Patient only remained at ALF until 12/08/19 because father was contacted by Director stating that patient was "acting out" because he wanted to go home.  Father picked patient up that day and took him back home. Father requesting placement in facility with memory care/dementia unit.  Case Manager Clinical Goal(s):  [name] Over the next 180 days, patient will work with BSW to address needs related to Level of care concerns; placement . Over the next 180 days, BSW will collaborate with RN Care Manager to address care management and care coordination needs  Interventions:  . Faxed [place] 2/medical records to [facility], Kandy Garrison, for second time as they were not received on 03/29/20 . Received response from Al[facility] at [facility] that they have no Medicaid beds available at this time . Provided father with letter/update on placement efforts since last update on 03/03/20.   . Informed father that CCM BSW is still awaiting response from [address] and [facility].    . Patient Self Care Activities:  . Attends all scheduled provider appointments  Please see past updates related to this goal by clicking on the "Past Updates" button in the selected goal         Patient verbalizes understanding of instructions provided today.    The patient and father/legal guardian have been provided with contact information for the care management team and has been advised to call with any health related questions or concerns.     [name], BSW Embedded Care Coordination Social Worker [facility]  [phone]

## 2020-04-01 NOTE — Chronic Care Management (AMB) (Signed)
  Care Management   Follow Up Note   04/01/2020 Name: Eric Stephens MRN: [id number] DOB: 04-28-1981  Referred by: Alphonzo Severance, MD Reason for referral : Care Coordination (placement)   Eric Stephens is a 39 y.o. year old male who is a primary care patient of Alphonzo Severance, MD. The care management team was consulted for assistance with care management and care coordination needs.    Review of patient status, including review of consultants reports, relevant laboratory and other test results, and collaboration with appropriate care team members and the patient's provider was performed as part of comprehensive patient evaluation and provision of chronic care management services.    SDOH (Social Determinants of Health) assessments performed: No See Care Plan activities for detailed interventions related to [facility])     Advanced Directives: See Care Plan and Vynca application for related entries.   Goals Addressed              This Visit's Progress   .  Per Father/Legal Guardian, "He needs long term, supervised care." (pt-stated)        Current Barriers:  [name] Knowledge Barriers related to resources and support available to address needs related to Level of care concerns; placement.   Patient moved into [facility] on 12/02/19.  Patient only remained at ALF until 12/08/19 because father was contacted by Director stating that patient was "acting out" because he wanted to go home.  Father picked patient up that day and took him back home. Father requesting placement in facility with memory care/dementia unit.  Case Manager Clinical Goal(s):  [name] Over the next 180 days, patient will work with BSW to address needs related to Level of care concerns; placement . Over the next 180 days, BSW will collaborate with RN Care Manager to address care management and care coordination needs  Interventions:  . Faxed [place] 2/medical records to [facility], Kandy Garrison, for second time as they  were not received on 03/29/20 . Received response from K[facility] at [facility] that they have no Medicaid beds available at this time . Provided father with letter/update on placement efforts since last update on 03/03/20.   . Informed father that CCM BSW is still awaiting response from [address] and [facility].    . Patient Self Care Activities:  . Attends all scheduled provider appointments  Please see past updates related to this goal by clicking on the "Past Updates" button in the selected goal          The patient and father/legal guardian have been provided with contact information for the care management team and has been advised to call with any health related questions or concerns.     [name], BSW Embedded Care Coordination Social Worker [facility] [phone]

## 2020-04-01 NOTE — Progress Notes (Signed)
Internal Medicine Clinic Resident  I have personally reviewed this encounter including the documentation in this note and/or discussed this patient with the care management provider. I will address any urgent items identified by the care management provider and will communicate my actions to the patient's PCP. I have reviewed the patient's CCM visit with my supervising attending, Dr Mullen.  Eric Stephens D [name], DO 04/01/2020    

## 2020-04-02 DIAGNOSIS — N3944 Nocturnal enuresis: Secondary | ICD-10-CM | POA: Insufficient documentation

## 2020-04-02 LAB — URINALYSIS, ROUTINE W REFLEX MICROSCOPIC
Bilirubin, UA: NEGATIVE
Glucose, UA: NEGATIVE
Ketones, UA: NEGATIVE
Leukocytes,UA: NEGATIVE
Nitrite, UA: NEGATIVE
Protein,UA: NEGATIVE
RBC, UA: NEGATIVE
Specific Gravity, UA: 1.011 (ref 1.005–1.030)
Urobilinogen, Ur: 0.2 mg/dL (ref 0.2–1.0)
pH, UA: 7 (ref 5.0–7.5)

## 2020-04-02 NOTE — Progress Notes (Signed)
   CC: nocturnal enuresis   HPI:  Mr.Jeremian E Verde is a 39 y.o. M presenting with father to discuss nocturnal enuresis that has been going on for 6 months. Father would also like to talk with CCM about facility placement. Please refer to problem based charting for further details and assessment and plan of current problem and chronic medical conditions.   Past Medical History:  Diagnosis Date  . Asthma   . Syphilis    Review of Systems:  Negative as per HPI  Physical Exam:  Vitals:   04/01/20 0911  BP: 102/77  Pulse: 72  Temp: 98.4 F (36.9 C)  TempSrc: Oral  SpO2: 100%  Weight: 258 lb 9.6 oz (117.3 kg)  Height: 5\' 11"  (1.803 m)   Physical Exam Constitutional:      Appearance: He is obese.  HENT:     Head: Normocephalic and atraumatic.     Right Ear: Tympanic membrane normal.     Left Ear: Tympanic membrane normal.     Nose: Nose normal.     Mouth/Throat:     Mouth: Mucous membranes are moist.  Eyes:     Extraocular Movements: Extraocular movements intact.     Pupils: Pupils are equal, round, and reactive to light.  Cardiovascular:     Rate and Rhythm: Normal rate and regular rhythm.     Pulses: Normal pulses.     Heart sounds: Normal heart sounds.  Pulmonary:     Effort: Pulmonary effort is normal.  Abdominal:     General: Abdomen is flat. Bowel sounds are normal. There is no distension.     Palpations: Abdomen is soft.     Tenderness: There is no abdominal tenderness.  Musculoskeletal:        General: Normal range of motion.  Skin:    General: Skin is warm and dry.     Capillary Refill: Capillary refill takes less than 2 seconds.  Neurological:     General: No focal deficit present.     Mental Status: He is alert. Mental status is at baseline.      Assessment & Plan:   See Encounters Tab for problem based charting.  Patient seen with Dr. 

## 2020-04-02 NOTE — Progress Notes (Addendum)
Internal Medicine Clinic Attending  I saw and evaluated the patient.  I personally confirmed the key portions of the history and exam documented by Dr. Liang and I reviewed pertinent patient test results.  The assessment, diagnosis, and plan were formulated together and I agree with the documentation in the resident's note.  Carley Strickling, M.D., Ph.D.  

## 2020-04-02 NOTE — Assessment & Plan Note (Signed)
Father spoke with CCM prior to visit today about facility placement for this patient and he feels son needs more supervised care. Placement has been difficult and limited by lack of beds. CCM looking into [employer] and [facility] assisted living at this time. Will continue to work with CCM about any necessary paperwork if a facility is found for him.

## 2020-04-02 NOTE — Assessment & Plan Note (Signed)
Patient and father presenting for nocturnal enuresis that has been occurring for the past 6 months. Father state the patient frequently drinks water, juice and soda prior to going to bed around 10pm and wets the bed about 5 times a week. Patient states she often wakes up at night need to use the bathroom and wets the bed about once a week. Both deny any daytime instances of incontinence. The patient notes some increased frequency but fever, dysuria, dribbling, hematuria, weak or difficulty starting stream, or feelings of incomplete emptying. He also denies any incontinence associated with body position, standing, jumping, laughing or coughing. Physical exam is unremarkable.  Post void residual volume ~94 ml low suspicion for obstructive pathology. Will check a UA. Suspect this may be functional incontinence. Patient has tried to limit water intake before bed but frequently forgets. Recommend patient try to limit water intake about 2 hours prior to sleeping and to use the bath room a couple of times prior to going to bed to completely empty his bladder.  Plan - follow up UA - behavorial modification

## 2020-04-05 NOTE — Progress Notes (Signed)
Internal Medicine Clinic Attending  CCM services provided by the care management provider and their documentation were discussed with Dr. Bloomfield. We reviewed the pertinent findings, urgent action items addressed by the resident and non-urgent items to be addressed by the PCP.  I agree with the assessment, diagnosis, and plan of care documented in the CCM and resident's note.  Oretha Weismann, MD 04/05/2020  

## 2020-04-06 ENCOUNTER — Ambulatory Visit: Payer: Medicaid Other

## 2020-04-06 DIAGNOSIS — F0281 Dementia in other diseases classified elsewhere with behavioral disturbance: Secondary | ICD-10-CM

## 2020-04-06 DIAGNOSIS — A523 Neurosyphilis, unspecified: Secondary | ICD-10-CM

## 2020-04-06 DIAGNOSIS — F02818 Dementia in other diseases classified elsewhere, unspecified severity, with other behavioral disturbance: Secondary | ICD-10-CM

## 2020-04-06 NOTE — Patient Instructions (Signed)
Visit Information  Goals Addressed              This Visit's Progress   .  Per Father/Legal Guardian, "He needs long term, supervised care." (pt-stated)        Current Barriers:  [name] Knowledge Barriers related to resources and support available to address needs related to Level of care concerns; placement.   Patient moved into [facility] on 12/02/19.  Patient only remained at ALF until 12/08/19 because father was contacted by Director stating that patient was "acting out" because he wanted to go home.  Father picked patient up that day and took him back home. Father requesting placement in facility with memory care/dementia unit.  Case Manager Clinical Goal(s):  [name] Over the next 180 days, patient will work with BSW to address needs related to Level of care concerns; placement . Over the next 180 days, BSW will collaborate with RN Care Manager to address care management and care coordination needs  Interventions:  . Received response from Daryel Gerald at [facility] that they have no male beds available at this time.   . Patient Self Care Activities:  . Attends all scheduled provider appointments  Please see past updates related to this goal by clicking on the "Past Updates" button in the selected goal         The patient's father has been provided with contact information for the care management team and has been advised to call with any health related questions or concerns.      [name], BSW Embedded Care Coordination Social Worker [facility] [phone]

## 2020-04-06 NOTE — Chronic Care Management (AMB) (Signed)
  Care Management   Follow Up Note   04/06/2020 Name: Eric Stephens MRN: [id number] DOB: 02-03-1981  Referred by: Alphonzo Severance, MD Reason for referral : Care Coordination (placement)   Eric Stephens is a 39 y.o. year old male who is a primary care patient of Alphonzo Severance, MD. The care management team was consulted for assistance with care management and care coordination needs.    Review of patient status, including review of consultants reports, relevant laboratory and other test results, and collaboration with appropriate care team members and the patient's provider was performed as part of comprehensive patient evaluation and provision of chronic care management services.    SDOH (Social Determinants of Health) assessments performed: No See Care Plan activities for detailed interventions related to [facility])     Advanced Directives: See Care Plan and Vynca application for related entries.   Goals Addressed              This Visit's Progress   .  Per Father/Legal Guardian, "He needs long term, supervised care." (pt-stated)        Current Barriers:  [name] Knowledge Barriers related to resources and support available to address needs related to Level of care concerns; placement.   Patient moved into [facility] on 12/02/19.  Patient only remained at ALF until 12/08/19 because father was contacted by Director stating that patient was "acting out" because he wanted to go home.  Father picked patient up that day and took him back home. Father requesting placement in facility with memory care/dementia unit.  Case Manager Clinical Goal(s):  [name] Over the next 180 days, patient will work with BSW to address needs related to Level of care concerns; placement . Over the next 180 days, BSW will collaborate with RN Care Manager to address care management and care coordination needs  Interventions:  . Received response from Daryel Gerald at [facility] that they have no male beds available at this  time.   . Patient Self Care Activities:  . Attends all scheduled provider appointments  Please see past updates related to this goal by clicking on the "Past Updates" button in the selected goal          The patient's father has been provided with contact information for the care management team and has been advised to call with any health related questions or concerns.      [name], BSW Embedded Care Coordination Social Worker [facility] [phone]

## 2020-04-08 ENCOUNTER — Ambulatory Visit: Payer: Medicaid Other

## 2020-04-08 DIAGNOSIS — A523 Neurosyphilis, unspecified: Secondary | ICD-10-CM

## 2020-04-08 DIAGNOSIS — F0281 Dementia in other diseases classified elsewhere with behavioral disturbance: Secondary | ICD-10-CM

## 2020-04-08 DIAGNOSIS — F02818 Dementia in other diseases classified elsewhere, unspecified severity, with other behavioral disturbance: Secondary | ICD-10-CM

## 2020-04-08 NOTE — Chronic Care Management (AMB) (Signed)
  Care Management   Follow Up Note   04/08/2020 Name: Eric Stephens MRN: [id number] DOB: [birth year]  Referred by: Alphonzo Severance, MD Reason for referral : Care Coordination (placement)   Eric Stephens is a 39 y.o. year old male who is a primary care patient of Alphonzo Severance, MD. The care management team was consulted for assistance with care management and care coordination needs.    Review of patient status, including review of consultants reports, relevant laboratory and other test results, and collaboration with appropriate care team members and the patient's provider was performed as part of comprehensive patient evaluation and provision of chronic care management services.    SDOH (Social Determinants of Health) assessments performed: No See Care Plan activities for detailed interventions related to [facility])     Advanced Directives: See Care Plan and Vynca application for related entries.   Goals Addressed              This Visit's Progress   .  Per Father/Legal Guardian, "He needs long term, supervised care." (pt-stated)        Current Barriers:  [name] Knowledge Barriers related to resources and support available to address needs related to Level of care concerns; placement.   Patient moved into [facility] on 12/02/19.  Patient only remained at ALF until 12/08/19 because father was contacted by Director stating that patient was "acting out" because he wanted to go home.  Father picked patient up that day and took him back home. Father requesting placement in facility with memory care/dementia unit.  Case Manager Clinical Goal(s):  [name] Over the next 180 days, patient will work with BSW to address needs related to Level of care concerns; placement . Over the next 180 days, BSW will collaborate with RN Care Manager to address care management and care coordination needs  Interventions:  . Left message for Kandy Garrison with [facility] requesting call back for update on referral.     . Patient Self Care Activities:  . Attends all scheduled provider appointments  Please see past updates related to this goal by clicking on the "Past Updates" button in the selected goal          The patient's father/guardian has been provided with contact information for the care management team and has been advised to call with any health related questions or concerns.    [name], BSW Embedded Care Coordination Social Worker [facility] [phone]

## 2020-04-08 NOTE — Patient Instructions (Signed)
Visit Information  Goals Addressed              This Visit's Progress   .  Per Father/Legal Guardian, "He needs long term, supervised care." (pt-stated)        Current Barriers:  [name] Knowledge Barriers related to resources and support available to address needs related to Level of care concerns; placement.   Patient moved into [facility] on 12/02/19.  Patient only remained at ALF until 12/08/19 because father was contacted by Director stating that patient was "acting out" because he wanted to go home.  Father picked patient up that day and took him back home. Father requesting placement in facility with memory care/dementia unit.  Case Manager Clinical Goal(s):  [name] Over the next 180 days, patient will work with BSW to address needs related to Level of care concerns; placement . Over the next 180 days, BSW will collaborate with RN Care Manager to address care management and care coordination needs  Interventions:  . Left message for Kandy Garrison with [facility] requesting call back for update on referral.    . Patient Self Care Activities:  . Attends all scheduled provider appointments  Please see past updates related to this goal by clicking on the "Past Updates" button in the selected goal           The patient's father/guardian has been provided with contact information for the care management team and has been advised to call with any health related questions or concerns.       [name], BSW Embedded Care Coordination Social Worker [facility] [phone]

## 2020-04-09 NOTE — Progress Notes (Signed)
Internal Medicine Clinic Resident  I have personally reviewed this encounter including the documentation in this note and/or discussed this patient with the care management provider. I will address any urgent items identified by the care management provider and will communicate my actions to the patient's PCP. I have reviewed the patient's CCM visit with my supervising attending, Dr Vincent.  Shirlena Brinegar, MD 04/09/2020    

## 2020-04-09 NOTE — Progress Notes (Signed)
Internal Medicine Clinic Attending  CCM services provided by the care management provider and their documentation were discussed with Dr. Coe. We reviewed the pertinent findings, urgent action items addressed by the resident and non-urgent items to be addressed by the PCP.  I agree with the assessment, diagnosis, and plan of care documented in the CCM and resident's note.  Philander Ake Thomas Tyler Cubit, MD 04/09/2020  

## 2020-04-12 ENCOUNTER — Telehealth: Payer: Medicaid Other

## 2020-04-13 ENCOUNTER — Ambulatory Visit: Payer: Medicaid Other

## 2020-04-13 DIAGNOSIS — F0281 Dementia in other diseases classified elsewhere with behavioral disturbance: Secondary | ICD-10-CM

## 2020-04-13 DIAGNOSIS — A523 Neurosyphilis, unspecified: Secondary | ICD-10-CM

## 2020-04-13 DIAGNOSIS — F02818 Dementia in other diseases classified elsewhere, unspecified severity, with other behavioral disturbance: Secondary | ICD-10-CM

## 2020-04-13 NOTE — Patient Instructions (Signed)
Visit Information  Goals Addressed              This Visit's Progress   .  COMPLETED: Per Father/Legal Guardian, "He needs long term, supervised care." (pt-stated)        Current Barriers:  [name] Knowledge Barriers related to resources and support available to address needs related to Level of care concerns; placement.   Patient moved into [facility] on 12/02/19.  Patient only remained at ALF until 12/08/19 because father was contacted by Director stating that patient was "acting out" because he wanted to go home.  Father picked patient up that day and took him back home. Father requesting placement in facility with memory care/dementia unit.  Case Manager Clinical Goal(s):  [name] Over the next 180 days, patient will work with BSW to address needs related to Level of care concerns; placement . Over the next 180 days, BSW will collaborate with RN Care Manager to address care management and care coordination needs  Interventions:  . Sent letter to father/legal guardian with final update on placement options. . Closing goal of care plan as placement options have been exhausted at this time.   . Patient Self Care Activities:  . Attends all scheduled provider appointments  Please see past updates related to this goal by clicking on the "Past Updates" button in the selected goal      .  COMPLETED: Statement per Father/Legal Guardian "He needs long-term, supervised care." (pt-stated)        CARE PLAN ENTRY (see longitudinal plan of care for additional care plan information)   Current Barriers:  . Chronic Disease Management support, education, and care coordination needs related to HLD, Dementia, and neurosyphilis  Case Manager Clinical Goal(s):  [name] Over the next 90 days, patient will work with BSW to address needs related to ADL IADL limitations in patient with HLD, Dementia, and neurosyphilis  Interventions:  . Sent letter to father/legal guardian with final update on placement options. .  Closing goal of care plan as placement options have been exhausted at this time.  .   Patient Self Care Activities:  . Performs ADL's independently . Performs IADL's independently  Please see past updates related to this goal by clicking on the "Past Updates" button in the selected goal         Patient verbalizes understanding of instructions provided today.   CCM enrollment status changed to "previously enrolled" as patient was referred for long term placement and CCM BSW has exhausted all options at this time.       [name], BSW Embedded Care Coordination Social Worker [facility] [phone]

## 2020-04-13 NOTE — Chronic Care Management (AMB) (Signed)
Care Management   Follow Up Note   04/13/2020 Name: Eric Stephens MRN: [id number] DOB: [birth year]  Referred by: Alphonzo Severance, MD Reason for referral : Care Coordination (placement)   Eric Stephens is a 39 y.o. year old male who is a primary care patient of Alphonzo Severance, MD. The care management team was consulted for assistance with care management and care coordination needs.    Review of patient status, including review of consultants reports, relevant laboratory and other test results, and collaboration with appropriate care team members and the patient's provider was performed as part of comprehensive patient evaluation and provision of chronic care management services.    SDOH (Social Determinants of Health) assessments performed: No See Care Plan activities for detailed interventions related to [facility])     Advanced Directives: See Care Plan and Vynca application for related entries.   Goals Addressed              This Visit's Progress   .  COMPLETED: Per Father/Legal Guardian, "He needs long term, supervised care." (pt-stated)        Current Barriers:  [name] Knowledge Barriers related to resources and support available to address needs related to Level of care concerns; placement.   Patient moved into [facility] on 12/02/19.  Patient only remained at ALF until 12/08/19 because father was contacted by Director stating that patient was "acting out" because he wanted to go home.  Father picked patient up that day and took him back home. Father requesting placement in facility with memory care/dementia unit.  Case Manager Clinical Goal(s):  [name] Over the next 180 days, patient will work with BSW to address needs related to Level of care concerns; placement . Over the next 180 days, BSW will collaborate with RN Care Manager to address care management and care coordination needs  Interventions:  . Sent letter to father/legal guardian with final update on placement options. . Closing  goal of care plan as placement options have been exhausted at this time.   . Patient Self Care Activities:  . Attends all scheduled provider appointments  Please see past updates related to this goal by clicking on the "Past Updates" button in the selected goal      .  COMPLETED: Statement per Father/Legal Guardian "He needs long-term, supervised care." (pt-stated)        CARE PLAN ENTRY (see longitudinal plan of care for additional care plan information)   Current Barriers:  . Chronic Disease Management support, education, and care coordination needs related to HLD, Dementia, and neurosyphilis  Case Manager Clinical Goal(s):  [name] Over the next 90 days, patient will work with BSW to address needs related to ADL IADL limitations in patient with HLD, Dementia, and neurosyphilis  Interventions:  . Sent letter to father/legal guardian with final update on placement options. . Closing goal of care plan as placement options have been exhausted at this time.  .   Patient Self Care Activities:  . Performs ADL's independently . Performs IADL's independently  Please see past updates related to this goal by clicking on the "Past Updates" button in the selected goal          CCM enrollment status changed to "previously enrolled" as patient was referred for long term placement and all options have been exhausted at this time.      [name], BSW Embedded Care Coordination Social Worker [facility] [phone]

## 2020-04-13 NOTE — Progress Notes (Signed)
Internal Medicine Clinic Resident  I have personally reviewed this encounter including the documentation in this note and/or discussed this patient with the care management provider. I will address any urgent items identified by the care management provider and will communicate my actions to the patient's PCP. I have reviewed the patient's CCM visit with my supervising attending, Dr Mullen.  Laurali Goddard, MD  [facility] PGY-2 04/13/2020    

## 2020-04-19 NOTE — Progress Notes (Signed)
Internal Medicine Clinic Attending  CCM services provided by the care management provider and their documentation were discussed with Dr. Aslam. We reviewed the pertinent findings, urgent action items addressed by the resident and non-urgent items to be addressed by the PCP.  I agree with the assessment, diagnosis, and plan of care documented in the CCM and resident's note.  Nyah Shepherd, MD 04/19/2020  

## 2020-04-19 NOTE — Progress Notes (Signed)
Internal Medicine Clinic Attending  CCM services provided by the care management provider and their documentation were discussed with Dr. Coe. We reviewed the pertinent findings, urgent action items addressed by the resident and non-urgent items to be addressed by the PCP.  I agree with the assessment, diagnosis, and plan of care documented in the CCM and resident's note.  Adayah Arocho Thomas Malick Netz, MD 04/19/2020  

## 2020-04-19 NOTE — Progress Notes (Signed)
Internal Medicine Clinic Resident  I have personally reviewed this encounter including the documentation in this note and/or discussed this patient with the care management provider. I will address any urgent items identified by the care management provider and will communicate my actions to the patient's PCP. I have reviewed the patient's CCM visit with my supervising attending, Dr Vincent.  Anisah Kuck, MD 04/19/2020    

## 2020-05-25 ENCOUNTER — Ambulatory Visit: Payer: Medicaid Other | Admitting: Podiatry

## 2020-06-07 ENCOUNTER — Other Ambulatory Visit: Payer: Self-pay

## 2020-06-07 ENCOUNTER — Encounter: Payer: Self-pay | Admitting: Podiatry

## 2020-06-07 ENCOUNTER — Ambulatory Visit (INDEPENDENT_AMBULATORY_CARE_PROVIDER_SITE_OTHER): Payer: Medicaid Other | Admitting: Podiatry

## 2020-06-07 DIAGNOSIS — M79675 Pain in left toe(s): Secondary | ICD-10-CM

## 2020-06-07 DIAGNOSIS — M79674 Pain in right toe(s): Secondary | ICD-10-CM

## 2020-06-07 DIAGNOSIS — G63 Polyneuropathy in diseases classified elsewhere: Secondary | ICD-10-CM

## 2020-06-07 DIAGNOSIS — B351 Tinea unguium: Secondary | ICD-10-CM

## 2020-06-13 NOTE — Progress Notes (Signed)
Subjective:  Patient ID: Eric Stephens, male    DOB: 1981-06-15,  MRN: [id number]  MYRON STANKOVICH presents to clinic today for painful thick toenails that are difficult to trim. Pain interferes with ambulation. Aggravating factors include wearing enclosed shoe gear. Pain is relieved with periodic professional debridement..  39 y.o. male presents with the above complaint.    Review of Systems: Negative except as noted in the HPI. Past Medical History:  Diagnosis Date  . Asthma   . Syphilis    Past Surgical History:  Procedure Laterality Date  . WRIST SURGERY      Current Outpatient Medications:  .  brimonidine (ALPHAGAN) 0.2 % ophthalmic solution, PLACE 1 DROP INTO RIGHT EYE TWICE DAILY, Disp: , Rfl:  .  cetirizine (ZYRTEC) 10 MG tablet, TAKE 1 TABLET BY MOUTH EVERY DAY AS NEEDED FOR ALLERGY, Disp: 90 tablet, Rfl: 1 .  COMBIGAN 0.2-0.5 % ophthalmic solution, INSTILL 1 DROP INTO BOTH EYES TWICE A DAY, Disp: , Rfl:  .  [employer] SALINE NOSE SPRAY 0.65 % nasal spray, PLACE 1 SPRAY INTO BOTH NOSTRILS AS NEEDED FOR CONGESTION., Disp: 30 mL, Rfl: 0 .  cyclobenzaprine (FLEXERIL) 10 MG tablet, Take 1 tablet (10 mg total) by mouth 2 (two) times daily as needed for muscle spasms., Disp: 20 tablet, Rfl: 0 .  diphenhydrAMINE (BENADRYL) 25 mg capsule, Inject into the vein., Disp: , Rfl:  .  gabapentin (NEURONTIN) 800 MG tablet, TAKE 1 TABLET TWICE A DAY FOR ANXIETY AND IRRITABLITY, Disp: , Rfl:  .  Methylfol-Methylcob-Acetylcyst (METAFOLBIC PLUS) 6-2-600 MG TABS, Take by mouth., Disp: , Rfl:  .  Olopatadine HCl 0.2 % SOLN, Apply 1 drop to eye every morning., Disp: , Rfl:  .  polyethylene glycol powder (GLYCOLAX/MIRALAX) 17 GM/SCOOP powder, Take by mouth., Disp: , Rfl:  .  PROVENTIL HFA 108 (90 Base) MCG/ACT inhaler, TAKE 2 PUFFS BY MOUTH EVERY 6 HOURS AS NEEDED FOR WHEEZE OR SHORTNESS OF BREATH, Disp: 6.7 Inhaler, Rfl: 1 .  Pyridoxine HCl 200 MG TBCR, Take by mouth., Disp: , Rfl:  .  RESTASIS 0.05 %  ophthalmic emulsion, 1 drop 2 (two) times daily., Disp: , Rfl:  .  vitamin E 180 MG (400 UNITS) capsule, Take by mouth., Disp: , Rfl:  .  zolpidem (AMBIEN) 10 MG tablet, Take 10 mg by mouth at bedtime as needed., Disp: , Rfl:  No Known Allergies Social History   Occupational History  . Not on file  Tobacco Use  . Smoking status: Current Some Day Smoker    Packs/day: 0.50    Types: Cigarettes  . Smokeless tobacco: Never Used  . Tobacco comment: .5 pks per day   Vaping Use  . Vaping Use: Never used  Substance and Sexual Activity  . Alcohol use: No  . Drug use: No  . Sexual activity: Yes    Objective:   Constitutional Pradeep E Kreuser is a pleasant 39 y.o. African American male, in NAD. AAO x 3.   Vascular Capillary refill time to digits immediate b/l. Palpable pedal pulses b/l LE. Pedal hair present. Lower extremity skin temperature gradient within normal limits. No cyanosis or clubbing noted.  Neurologic Normal speech. Oriented to person, place, and time. Protective sensation intact 5/5 intact bilaterally with 10g monofilament b/l. Vibratory sensation intact b/l.  Dermatologic Pedal skin with normal turgor, texture and tone bilaterally. No open wounds bilaterally. No interdigital macerations bilaterally. Toenails 1-5 b/l elongated, discolored, dystrophic, thickened, crumbly with subungual debris and tenderness to dorsal  palpation. Hyperkeratotic lesion(s) submet head 2 left foot and submet head 2 right foot.  No erythema, no edema, no drainage, no fluctuance.  Orthopedic: Normal muscle strength 5/5 to all lower extremity muscle groups bilaterally. No pain crepitus or joint limitation noted with ROM b/l. Hammertoes noted to the L 2nd toe and R 2nd toe. Patient ambulates independent of any assistive aids.   Radiographs: None Assessment:   1. Pain due to onychomycosis of toenails of both feet   2. Polyneuropathy associated with underlying disease (HCC)    Plan:  Patient was evaluated and  treated and all questions answered.  Onychomycosis with pain -Nails palliatively debridement as below -Educated on self-care  Procedure: Nail Debridement Rationale: Pain Type of Debridement: manual, sharp debridement. Instrumentation: Nail nipper, rotary burr. Number of Nails: 10 -Examined patient. -Patient to continue soft, supportive shoe gear daily. -Toenails 1-5 b/l were debrided in length and girth with sterile nail nippers and dremel without iatrogenic bleeding.  -As a courtesy, callus(es) submet head 2 left foot and submet head 2 right foot pared utilizing sterile scalpel blade without complication or incident. Total number debrided =2. -Patient to report any pedal injuries to medical professional immediately. -Patient/POA to call should there be question/concern in the interim.  Return in about 3 months (around 09/05/2020) for nail trim.  Freddie Breech, DPM

## 2020-07-22 NOTE — Progress Notes (Signed)
   CC: weight gain, increaseing forgetfulness  HPI:  [name] is a 40 y.o. male with history as below presenting for follow up on the above. Please refer to problem based charting for further details of assessment and plan of current problem and chronic medical conditions.  Past Medical History:  Diagnosis Date  . Asthma   . Syphilis    Review of Systems:   Review of Systems  Constitutional:       Weight gain  Respiratory: Negative for shortness of breath.   Cardiovascular: Negative for chest pain and palpitations.  Gastrointestinal: Negative for abdominal pain, constipation and diarrhea.  Musculoskeletal: Positive for falls.  Neurological: Negative for loss of consciousness.  Psychiatric/Behavioral: The patient has insomnia.        +forgetful  All other systems reviewed and are negative.    Physical Exam: Vitals:   07/23/20 0924  BP: 110/65  Pulse: 71  Temp: 97.8 F (36.6 C)  TempSrc: Oral  SpO2: 98%  Weight: 271 lb (122.9 kg)  Height: 5\' 11"  (1.803 m)   Constitutional: no acute distress Head: atraumatic ENT: external ears normal, pupillary response seems decreased bilaterally but they are PERRL Cardiovascular: regular rate and rhythm, normal heart sounds Pulmonary: effort normal, normal breath sounds bilaterally Abdominal: flat Skin: warm and dry Neurological: alert, no focal deficit, somewhat wide gait, not ataxic, speech is stuttering but has appropriate answers to basic questions, does seem to have difficulty recalling details at times Psychiatric: normal mood and affect  Mini-cog score of 4/5. Points missed for spatial awareness on clock drawing.  Assessment & Plan:   See Encounters Tab for problem based charting.  Patient discussed with Dr. 

## 2020-07-23 ENCOUNTER — Encounter: Payer: Self-pay | Admitting: Student

## 2020-07-23 ENCOUNTER — Ambulatory Visit: Payer: Medicaid Other | Admitting: Student

## 2020-07-23 ENCOUNTER — Other Ambulatory Visit: Payer: Self-pay

## 2020-07-23 VITALS — BP 110/65 | HR 71 | Temp 97.8°F | Ht 71.0 in | Wt 271.0 lb

## 2020-07-23 DIAGNOSIS — R7303 Prediabetes: Secondary | ICD-10-CM

## 2020-07-23 DIAGNOSIS — F02818 Dementia in other diseases classified elsewhere, unspecified severity, with other behavioral disturbance: Secondary | ICD-10-CM

## 2020-07-23 DIAGNOSIS — E0590 Thyrotoxicosis, unspecified without thyrotoxic crisis or storm: Secondary | ICD-10-CM | POA: Diagnosis not present

## 2020-07-23 DIAGNOSIS — A523 Neurosyphilis, unspecified: Secondary | ICD-10-CM | POA: Diagnosis not present

## 2020-07-23 DIAGNOSIS — F0281 Dementia in other diseases classified elsewhere with behavioral disturbance: Secondary | ICD-10-CM | POA: Diagnosis not present

## 2020-07-23 DIAGNOSIS — E785 Hyperlipidemia, unspecified: Secondary | ICD-10-CM | POA: Diagnosis not present

## 2020-07-23 DIAGNOSIS — E669 Obesity, unspecified: Secondary | ICD-10-CM

## 2020-07-23 DIAGNOSIS — R635 Abnormal weight gain: Secondary | ICD-10-CM

## 2020-07-23 LAB — GLUCOSE, CAPILLARY: Glucose-Capillary: 102 mg/dL — ABNORMAL HIGH (ref 70–99)

## 2020-07-23 LAB — POCT GLYCOSYLATED HEMOGLOBIN (HGB A1C): Hemoglobin A1C: 6.2 % — AB (ref 4.0–5.6)

## 2020-07-23 NOTE — Patient Instructions (Signed)
Thank you for allowing [place] to be a part of your care today, it was a pleasure seeing you. We discussed your weight f  I am checking these labs: A1c, lipid panel, TSH, T3, RPR  For the weight gain, I am checking for diabetes, high cholesterol, and thyroid function. I will call you with the results of these tests. The most important intervention will be behavioral: keeping lower calories foods around the house, or discouraging him from eating excessively.  For the forgetfulness, this is unfortunately, an expected progression. I am checking a RPR to ensure this has not returned. I do not think a CT scan will be helpful, so am holding off on that for now.   Please follow up in 4 months with your PCP   Thank you, and please call the Internal Medicine Clinic at [phone] if you have any questions.  Best, Dr. Imogene Burn

## 2020-07-23 NOTE — Assessment & Plan Note (Addendum)
See notes under "neurosyphilis" problem. Briefly summarized,  he was diagnosed and treated for syphilis in 2000. In 2017, he developed hallucinations, anhedonia, and disordered thought process so was admitted to [facility] for treatment of schizophrenia. Management included antipsychotics and total of 37 ECT treatments, since has had tardive dyskinesias. Later in the hospitalization, he had a positive RPR and VRDL so was retreated for neurosyphilis with IV penicillin for 10 days. Psychiatric problems seem to have resolved since then but retains some tremors, dyskinetic movements, and altered thought process. LP in CareEverywhere showed no pleocytosis in October 2017 then May 2018, RPR titer reduced rfom 1:64 < 1:8 after treatment. Felt to not need additional LPs. Most recent RPR was 1:16 in May 2019.   He is able to dress, clean, and feed himself. Is unable to cook effectively, and gets typically gets lost if out by himself. They worked closely with CCM last year to find ALF placement but unfortunately exhausted all the local options and have no offers.    Father notes increasing forgetfulness over the past several years, he has lost >10 cell phones. Father requests a CT scan due to atrophy noted on prior in 2017. Discussed with him that another scan will not contribute to treatment plans and will add unnecessary cost and radiation, he is reluctant but seems to understand.  -repeat RPR as last level in 2019 was higher -hold off on CT scan  Addendum: RPR of 1:2 which is lower than previous, no indication for retreatment at this time

## 2020-07-23 NOTE — Assessment & Plan Note (Addendum)
Father notes that patient has been eating more recently, and notes weight gain of 30 pounds over the past year. States that he sometimes feels as if the patient forgets he has already eaten. The patient eats food form the fridge, and the parents allow him to choose how to use his food stamps at the grocery store. No Hx of diabetes. Has HLD but ASCVD risk was 4.2% previously so not on medication. No reported change in energy level. States he is fairly active and walks a lot. Does note snoring as well  -check A1c, lipid panel, TSH -discussed behavioral modification: choosing lower calorie foods, restricting access to fridge. Father states that the patient is a grown man so wants to give some autonomy. We discussed striking a balance between autonomy and his wellbeing, father understands -considered sleep study. Patient has a cousin who wears a CPAP mask and does not feel he could utilize one regularly. Holding off on sleep study for now  Addendum: A1c 6.2, LCL 132, subclinical hyperthyroidism. See other problems

## 2020-07-24 LAB — LIPID PANEL: Cholesterol, Total: 198 mg/dL (ref 100–199)

## 2020-07-26 ENCOUNTER — Other Ambulatory Visit: Payer: Self-pay | Admitting: Student

## 2020-07-26 DIAGNOSIS — E0590 Thyrotoxicosis, unspecified without thyrotoxic crisis or storm: Secondary | ICD-10-CM | POA: Insufficient documentation

## 2020-07-26 DIAGNOSIS — R7303 Prediabetes: Secondary | ICD-10-CM | POA: Insufficient documentation

## 2020-07-26 NOTE — Assessment & Plan Note (Signed)
TSH of 0.371, T3 of 149 which is in normal range. No known history of thyroid issues. No chest pain, palpitations. Does not that energy has been high, but this has not been a recent change. The 10-year ASCVD risk score Denman George DC Jr., et al., 2013) is: 4.1%  -hold off on treatment as he is low risk -recheck TSH, T3, free T4 in about 6 months

## 2020-07-26 NOTE — Assessment & Plan Note (Signed)
A1c of 6.2%.  -discussed with father behavioral modifications to help with diet, such as reminding patient that he has already eaten, stocking lower calorie foods at home, and possibly restricting access to food

## 2020-07-26 NOTE — Assessment & Plan Note (Signed)
LDL of 132. The 10-year ASCVD risk score Denman George DC Montez Hageman., et al., 2013) is: 4.1%   -discussed with father behavioral modifications to help with diet, such as reminding patient that he has already eaten, stocking lower calorie foods at home, and possibly restricting access to food -hold off on medications given low ASCVD risk

## 2020-07-28 LAB — RPR, QUANT+TP ABS (REFLEX)
Rapid Plasma Reagin, Quant: 1:2 {titer} — ABNORMAL HIGH
T Pallidum Abs: REACTIVE — AB

## 2020-07-28 LAB — LIPID PANEL
Chol/HDL Ratio: 4 ratio (ref 0.0–5.0)
HDL: 49 mg/dL (ref >39)
LDL Chol Calc (NIH): 132 mg/dL — ABNORMAL HIGH (ref 0–99)
Triglycerides: 94 mg/dL (ref 0–149)
VLDL Cholesterol Cal: 17 mg/dL (ref 5–40)

## 2020-07-28 LAB — TSH: TSH: 0.371 u[IU]/mL — ABNORMAL LOW (ref 0.450–4.500)

## 2020-07-28 LAB — RPR: RPR Ser Ql: REACTIVE — AB

## 2020-07-28 LAB — T3: T3, Total: 149 ng/dL (ref 71–180)

## 2020-07-28 NOTE — Progress Notes (Signed)
Internal Medicine Clinic Attending  Case discussed with Dr. Chen  At the time of the visit.  We reviewed the resident's history and exam and pertinent patient test results.  I agree with the assessment, diagnosis, and plan of care documented in the resident's note. 

## 2020-08-17 NOTE — Progress Notes (Signed)
**Pt left before being seen by MD**    [facility] Note  08/24/2020     CHIEF COMPLAINT Patient presents for Retina Follow Up and Error   HISTORY OF PRESENT ILLNESS: Eric Stephens is a 40 y.o. male who presents to the clinic today for:   HPI    Retina Follow Up    Patient presents with  Other.  In both eyes.  This started 1 year ago.          Comments    Patient here for 1 year retina follow up for lattice degeneration OU. Patient states vision doing good. Can see well. No eye pain.       Last edited by Eric Caffey, MD on 08/24/2020  1:28 PM. (History)     Referring physician: Marin Stephens My Eric Stephens, [place],  [place] [id number]  HISTORICAL INFORMATION:   Selected notes from the MEDICAL RECORD NUMBER Referred by Eric Stephens for retinal evaluation LEE: 07.25.19 (Eric Stephens) [BCVA: OD: 20/60- OS: 20/60-] Ocular Hx-cataracts, HTN ret PMH-syphilis, schizophrenia, former smoker   CURRENT MEDICATIONS: Current Outpatient Medications (Ophthalmic Drugs)  Medication Sig  . Olopatadine HCl 0.2 % SOLN Apply 1 drop to eye every morning.  . RESTASIS 0.05 % ophthalmic emulsion 1 drop 2 (two) times daily.   No current facility-administered medications for this visit. (Ophthalmic Drugs)   Current Outpatient Medications (Other)  Medication Sig  . cetirizine (ZYRTEC) 10 MG tablet TAKE 1 TABLET BY MOUTH EVERY DAY AS NEEDED FOR ALLERGY  . gabapentin (NEURONTIN) 800 MG tablet TAKE 1 TABLET TWICE A DAY FOR ANXIETY AND IRRITABLITY  . PROVENTIL HFA 108 (90 Base) MCG/ACT inhaler TAKE 2 PUFFS BY MOUTH EVERY 6 HOURS AS NEEDED FOR WHEEZE OR SHORTNESS OF BREATH  . zolpidem (AMBIEN) 10 MG tablet Take 10 mg by mouth at bedtime as needed.   No current facility-administered medications for this visit. (Other)      REVIEW OF SYSTEMS: ROS    Positive for: Eyes   Negative for: Constitutional, Gastrointestinal, Neurological, Skin, Genitourinary, Musculoskeletal,  HENT, Endocrine, Cardiovascular, Respiratory, Psychiatric, Allergic/Imm, Heme/Lymph   Last edited by Eric Stephens on 08/24/2020  9:49 AM. (History)       ALLERGIES No Known Allergies  PAST MEDICAL HISTORY Past Medical History:  Diagnosis Date  . Asthma   . Syphilis    Past Surgical History:  Procedure Laterality Date  . WRIST SURGERY      FAMILY HISTORY Family History  Problem Relation Age of Onset  . Hypertension Father   . Hypertension Mother   . Arthritis Mother     SOCIAL HISTORY Social History   Tobacco Use  . Smoking status: Current Some Day Smoker    Packs/day: 0.50    Types: Cigarettes  . Smokeless tobacco: Never Used  . Tobacco Stephens: .5 pks per day   Vaping Use  . Vaping Use: Never used  Substance Use Topics  . Alcohol use: No  . Drug use: No         OPHTHALMIC EXAM:  Base Eye Exam    Visual Acuity (Snellen - Linear)      Right Left   Dist cc 20/40 -2 20/70   Dist ph cc NI 20/50 -1   Correction: Glasses       Tonometry (Tonopen, 9:47 AM)      Right Left   Pressure 16 15       Pupils  Dark Light Shape React APD   Right 3 2 Round Slow None   Left 3 2 Round Slow None       Visual Fields (Counting fingers)      Left Right    Full Full       Extraocular Movement      Right Left    Full, Ortho Full, Ortho       Neuro/Psych    Oriented x3: Yes   Mood/Affect: Normal       Dilation    Both eyes: 1.0% Mydriacyl, 2.5% Phenylephrine @ 9:47 AM        Refraction    Wearing Rx      Sphere Cylinder Axis Add   Right -12.75 +1.00 157 +2.50   Left -13.50 +1.75 040 +3.25   Type: Bifocal          IMAGING AND PROCEDURES  Imaging and Procedures for _0 @           ASSESSMENT/PLAN:    ICD-10-CM   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 Retinal breaks without detachment  H35.89   3. Severe myopia of both eyes  H52.13   4. Hx of syphilis  Z86.19   5. Retinal edema  H35.81   6. Nuclear  sclerosis of both eyes  H25.13   7. Primary open angle glaucoma (POAG) of both eyes, moderate stage  H40.1132   8. Erroneous encounter - disregard  ERROR EN     1,2. Lattice degeneration w/ atrophic holes, OU  - OD: pigmented lattice 0430-0600, small operculated hole at 1030  - OS: opercula at 0100 and 0230, lattice from 0430-0600  - S/P laser retinopexy OD (08.21.19) -- good laser surrounding  - S/P laser retinopexy OS (09.04.19), touch up laser (10.21.19) -- good laser surrounding  - f/u 1 year  3. High myopia OU  - under the expert refractive management of Dr. Marin Stephens  - OCT shows diffuse thinning of retina  - discussed findings and prognosis associated with high myopia / degenerative myopia  4. No retinal edema on exam or OCT  5. Nuclear sclerosis OU-   - The symptoms of cataract, surgical options, and treatments and risks were discussed with patient.  - discussed diagnosis and progression  - not yet visually significant  - monitor for now  6. History of Neurosyphilis  - quant RPR 1:16 on 10/2017 from 1:32 in 2017  - FA on 8.16.19 - normal study without leakage, hyperfluorescence or vasculitis  - no ocular involvement; no inflammation in anterior or posterior segments   7. POAG OU  - under the expert management of Dr. Midge Stephens  - changed brimonidine to comigan BID  - IOP today 18 OD, 16 OS   Ophthalmic Meds Ordered this visit:  No orders of the defined types were placed in this encounter.      No follow-ups on file.  There are no Patient Instructions on file for this visit.   Explained the diagnoses, plan, and follow up with the patient and they expressed understanding.  Patient expressed understanding of the importance of proper follow up care.   This document serves as a record of services personally performed by Eric Sleeper, MD, PhD. It was created on their behalf by Eric Stephens, COT an ophthalmic technician. The creation of this record is the provider's  dictation and/or activities during the visit.    Electronically signed by: Eric Stephens, COT 3.1.22 @ 1:28 PM  Abbreviations: M myopia (nearsighted); A astigmatism; H hyperopia (farsighted); P presbyopia; Mrx spectacle prescription;  CTL contact lenses; OD right eye; OS left eye; OU both eyes  XT exotropia; ET esotropia; PEK punctate epithelial keratitis; PEE punctate epithelial erosions; DES dry eye syndrome; MGD meibomian gland dysfunction; ATs artificial tears; PFAT's preservative free artificial tears; [place] nuclear sclerotic cataract; PSC posterior subcapsular cataract; ERM epi-retinal membrane; PVD posterior vitreous detachment; RD retinal detachment; DM diabetes mellitus; DR diabetic retinopathy; NPDR non-proliferative diabetic retinopathy; PDR proliferative diabetic retinopathy; CSME clinically significant macular edema; DME diabetic macular edema; dbh dot blot hemorrhages; CWS cotton wool spot; POAG primary open angle glaucoma; C/D cup-to-disc ratio; HVF humphrey visual field; GVF goldmann visual field; OCT optical coherence tomography; IOP intraocular pressure; BRVO Branch retinal vein occlusion; CRVO central retinal vein occlusion; CRAO central retinal artery occlusion; BRAO branch retinal artery occlusion; RT retinal tear; SB scleral buckle; PPV pars plana vitrectomy; VH Vitreous hemorrhage; PRP panretinal laser photocoagulation; IVK intravitreal kenalog; VMT vitreomacular traction; MH Macular hole;  NVD neovascularization of the disc; NVE neovascularization elsewhere; AREDS age related eye disease study; ARMD age related macular degeneration; POAG primary open angle glaucoma; EBMD epithelial/anterior basement membrane dystrophy; ACIOL anterior chamber intraocular lens; IOL intraocular lens; PCIOL posterior chamber intraocular lens; Phaco/IOL phacoemulsification with intraocular lens placement; [place] photorefractive keratectomy; LASIK laser assisted in situ keratomileusis; HTN hypertension; DM diabetes  mellitus; COPD chronic obstructive pulmonary disease

## 2020-08-24 ENCOUNTER — Other Ambulatory Visit: Payer: Self-pay

## 2020-08-24 ENCOUNTER — Encounter (INDEPENDENT_AMBULATORY_CARE_PROVIDER_SITE_OTHER): Payer: Self-pay | Admitting: Ophthalmology

## 2020-08-24 ENCOUNTER — Encounter (INDEPENDENT_AMBULATORY_CARE_PROVIDER_SITE_OTHER): Payer: Medicaid Other | Admitting: Ophthalmology

## 2020-08-24 DIAGNOSIS — H3589 Other specified retinal disorders: Secondary | ICD-10-CM

## 2020-08-24 DIAGNOSIS — H401132 Primary open-angle glaucoma, bilateral, moderate stage: Secondary | ICD-10-CM

## 2020-08-24 DIAGNOSIS — H2513 Age-related nuclear cataract, bilateral: Secondary | ICD-10-CM

## 2020-08-24 DIAGNOSIS — H3581 Retinal edema: Secondary | ICD-10-CM

## 2020-08-24 DIAGNOSIS — H35413 Lattice degeneration of retina, bilateral: Secondary | ICD-10-CM

## 2020-08-24 DIAGNOSIS — Z8619 Personal history of other infectious and parasitic diseases: Secondary | ICD-10-CM

## 2020-08-24 DIAGNOSIS — H5213 Myopia, bilateral: Secondary | ICD-10-CM

## 2020-08-24 NOTE — Progress Notes (Signed)
This encounter was created in error - please disregard.

## 2020-08-31 NOTE — Progress Notes (Addendum)
[facility] Note  09/01/2020     CHIEF COMPLAINT Patient presents for Retina Follow Up   HISTORY OF PRESENT ILLNESS: Eric Stephens is a 40 y.o. male who presents to the clinic today for:   HPI    Retina Follow Up    Patient presents with  Other.  In both eyes.  This started years ago.  Severity is moderate.  Duration of 1 year.  Since onset it is stable.  I, the attending physician,  performed the HPI with the patient and updated documentation appropriately.          Comments    40 y/o male pt here for 1 yr f/u for lattice w/atrophic holes OU.  S/p laser retinopexy OU.  Feels VA OU has improved.  Denies pain, FOL, floaters.  Combigan BID OU.       Last edited by Bernarda Caffey, MD on 09/01/2020  1:38 PM. (History)     pt states vision is doing well  Referring physician: Alexandria Lodge, MD [place],  [place] [id number]  HISTORICAL INFORMATION:   Selected notes from the MEDICAL RECORD NUMBER Referred by Dr. Maryjane Hurter for retinal evaluation LEE: 07.25.19 (M. Le) [BCVA: OD: 20/60- OS: 20/60-] Ocular Hx-cataracts, HTN ret PMH-syphilis, schizophrenia, former smoker    CURRENT MEDICATIONS: Current Outpatient Medications (Ophthalmic Drugs)  Medication Sig  . COMBIGAN 0.2-0.5 % ophthalmic solution Apply 1 drop to eye 2 (two) times daily.  . Olopatadine HCl 0.2 % SOLN Apply 1 drop to eye every morning.  . RESTASIS 0.05 % ophthalmic emulsion 1 drop 2 (two) times daily.   No current facility-administered medications for this visit. (Ophthalmic Drugs)   Current Outpatient Medications (Other)  Medication Sig  . cetirizine (ZYRTEC) 10 MG tablet TAKE 1 TABLET BY MOUTH EVERY DAY AS NEEDED FOR ALLERGY  . gabapentin (NEURONTIN) 800 MG tablet TAKE 1 TABLET TWICE A DAY FOR ANXIETY AND IRRITABLITY  . PROVENTIL HFA 108 (90 Base) MCG/ACT inhaler TAKE 2 PUFFS BY MOUTH EVERY 6 HOURS AS NEEDED FOR WHEEZE OR SHORTNESS OF BREATH  . traZODone (DESYREL) 100 MG  tablet GIVE 1 TABLET BY MOUTH AT BEDTIME FOR SLEEP  . zolpidem (AMBIEN) 10 MG tablet Take 10 mg by mouth at bedtime as needed.   No current facility-administered medications for this visit. (Other)      REVIEW OF SYSTEMS: ROS    Positive for: Neurological, Eyes, Psychiatric   Negative for: Constitutional, Gastrointestinal, Skin, Genitourinary, Musculoskeletal, HENT, Endocrine, Cardiovascular, Respiratory, Allergic/Imm, Heme/Lymph   Last edited by Matthew Folks, COA on 09/01/2020  9:41 AM. (History)       ALLERGIES No Known Allergies  PAST MEDICAL HISTORY Past Medical History:  Diagnosis Date  . Asthma   . Cataract    NS OU  . Glaucoma    POAG OU  . Syphilis    Past Surgical History:  Procedure Laterality Date  . WRIST SURGERY      FAMILY HISTORY Family History  Problem Relation Age of Onset  . Hypertension Father   . Hypertension Mother   . Arthritis Mother     SOCIAL HISTORY Social History   Tobacco Use  . Smoking status: Current Some Day Smoker    Packs/day: 0.50    Types: Cigarettes  . Smokeless tobacco: Never Used  . Tobacco comment: .5 pks per day   Vaping Use  . Vaping Use: Never used  Substance Use Topics  . Alcohol  use: No  . Drug use: No         OPHTHALMIC EXAM:  Base Eye Exam    Visual Acuity (Snellen - Linear)      Right Left   Dist cc 20/40 20/70   Dist ph cc NI 20/60   Correction: Glasses       Tonometry (Tonopen, 9:43 AM)      Right Left   Pressure 14 11       Pupils      Dark Light Shape React APD   Right 3 2 Round Slow None   Left 3 2 Round Slow None       Visual Fields (Counting fingers)      Left Right    Full Full       Extraocular Movement      Right Left    Full, Ortho Full, Ortho       Neuro/Psych    Oriented x3: Yes   Mood/Affect: Normal       Dilation    Both eyes: 1.0% Mydriacyl, 2.5% Phenylephrine @ 9:43 AM        Slit Lamp and Fundus Exam    Slit Lamp Exam      Right Left    Lids/Lashes Meibomian gland dysfunction, Dermatochalasis - upper lid Mild Meibomian gland dysfunction   Conjunctiva/Sclera Melanosis Melanosis   Cornea Arcus, trace Punctate epithelial erosions 1-2+ Punctate epithelial erosions   Anterior Chamber Deep and clear Deep and quiet   Iris Round and dilated Round and dilated   Lens 1+ Nuclear sclerosis, 1-2+ Cortical cataract 1+ Nuclear sclerosis, 1-2+ Cortical cataract   Vitreous Vitreous syneresis, prominent vitreous base temporally Vitreous syneresis, prominent vitreous base        Fundus Exam      Right Left   Disc trace Pallor, Temporal Peripapillary atrophy, Tilted disc trace Pallor, 360 Peripapillary atrophy, Tilted disc, Sharp rim   C/D Ratio 0.6 0.55   Macula Good foveal reflex, RPE mottling and clumping, No heme or edema Flat, Good foveal reflex, RPE mottling and clumping   Vessels Vascular attenuation, Tortuous Vascular attenuation, Tortuous   Periphery Attached, inferotemporal pigment changes, lattice degeneration from [phone] w/ good laser surrounding, small operculated hole at 1030 -- good laser surrounding; prominent vitreous base, White without pressure IT quadrant, No new RT/RD/lattice Attached, Opercula at 0100 and 0230 with good laser changes surrounding, small areas of pigmented lattice from [phone] -- good laser changes, prominent vitreous base, scattered White without pressure, No new RT/RD/lattice          IMAGING AND PROCEDURES  Imaging and Procedures for $RemoveBefore'@TODAY'[id number]$ @  OCT, Retina - OU - Both Eyes       Right Eye Quality was good. Central Foveal Thickness: 267. Progression has been stable. Findings include normal foveal contour, no IRF, no SRF, myopic contour.   Left Eye Quality was good. Central Foveal Thickness: 268. Progression has been stable. Findings include no IRF, no SRF, normal foveal contour, myopic contour.   Notes *Images captured and stored on drive  Diagnosis / Impression:  Myopic contour; NFP; No  IRF/SRF OU  Clinical management:  See below  Abbreviations: NFP - Normal foveal profile. CME - cystoid macular edema. PED - pigment epithelial detachment. IRF - intraretinal fluid. SRF - subretinal fluid. EZ - ellipsoid zone. ERM - epiretinal membrane. ORA - outer retinal atrophy. ORT - outer retinal tubulation. 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 Retinal breaks without detachment  H35.89   3. Severe myopia of both eyes  H52.13   4. Retinal edema  H35.81 OCT, Retina - OU - Both Eyes  5. Nuclear sclerosis of both eyes  H25.13   6. 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 Lattice degeneration w/ atrophic holes, OU  - OD: pigmented lattice 0430-0600, small operculated hole at 1030  - OS: opercula at 0100 and 0230, lattice from 0430-0600  - S/P laser retinopexy OD (08.21.19) -- good laser surrounding  - S/P laser retinopexy OS (09.04.19), touch up laser (10.21.19) -- good laser surrounding  - no new RT/RD or lattice  - f/u 1 year -- DFE/OCT  3. High myopia OU  - under the expert refractive management of Dr. Marin Comment  - OCT shows diffuse thinning of retina  - discussed prognosis and findings associated with high myopia/lattice degeneration  4. No retinal edema on exam or OCT  5. Nuclear sclerosis OU-   - The symptoms of cataract, surgical options, and treatments and risks were discussed with patient.  - discussed diagnosis and progression  - not yet visually significant  - monitor for now  6. History of Neurosyphilis  - quant RPR 1:16 on 10/2017 from 1:32 in 2017  - FA on 8.16.19 - normal study without leakage, hyperfluorescence or vasculitis  - no ocular involvement, no inflammation in anterior or posterior segments  7. POAG OU  - under the expert management of Dr. Midge Aver  - changed brimonidine to comigan BID  - IOP 14  OD, 11 OS today   Ophthalmic Meds Ordered this visit:  No orders of the defined types were placed in this encounter.      Return in about 1 year (around 09/01/2021) for f/u lattice with atrophic holes OU.  There are no Patient Instructions on file for this visit.   Explained the diagnoses, plan, and follow up with the patient and they expressed understanding.  Patient expressed understanding of the importance of proper follow up care.   This document serves as a record of services personally performed by Gardiner Sleeper, MD, PhD. It was created on their behalf by Roselee Nova, COMT. The creation of this record is the provider's dictation and/or activities during the visit.  Electronically signed by: Roselee Nova, COMT 09/01/20 1:41 PM   This document serves as a record of services personally performed by Gardiner Sleeper, MD, PhD. It was created on their behalf by San Jetty. Owens Shark, OA an ophthalmic technician. The creation of this record is the provider's dictation and/or activities during the visit.    Electronically signed by: San Jetty. Owens Shark, [place] 03.16.2022 1:41 PM   Gardiner Sleeper, M.D., Ph.D. Diseases & Surgery of the Retina and Vitreous [facility]  I have reviewed the above documentation for accuracy and completeness, and I agree with the above. Gardiner Sleeper, M.D., Ph.D. 09/01/20 1:41 PM   Abbreviations: M myopia (nearsighted); A astigmatism; H hyperopia (farsighted); P presbyopia; Mrx spectacle prescription;  CTL contact lenses; OD right eye; OS left eye; OU both eyes  XT exotropia; ET esotropia; PEK punctate epithelial keratitis; PEE punctate epithelial erosions; DES dry eye syndrome; MGD meibomian gland dysfunction; ATs artificial tears; PFAT's preservative free artificial tears; [place] nuclear sclerotic cataract; PSC posterior subcapsular  cataract; ERM epi-retinal membrane; PVD posterior vitreous detachment; RD retinal detachment; DM diabetes mellitus;  DR diabetic retinopathy; NPDR non-proliferative diabetic retinopathy; PDR proliferative diabetic retinopathy; CSME clinically significant macular edema; DME diabetic macular edema; dbh dot blot hemorrhages; CWS cotton wool spot; POAG primary open angle glaucoma; C/D cup-to-disc ratio; HVF humphrey visual field; GVF goldmann visual field; OCT optical coherence tomography; IOP intraocular pressure; BRVO Branch retinal vein occlusion; CRVO central retinal vein occlusion; CRAO central retinal artery occlusion; BRAO branch retinal artery occlusion; RT retinal tear; SB scleral buckle; PPV pars plana vitrectomy; VH Vitreous hemorrhage; PRP panretinal laser photocoagulation; IVK intravitreal kenalog; VMT vitreomacular traction; MH Macular hole;  NVD neovascularization of the disc; NVE neovascularization elsewhere; AREDS age related eye disease study; ARMD age related macular degeneration; POAG primary open angle glaucoma; EBMD epithelial/anterior basement membrane dystrophy; ACIOL anterior chamber intraocular lens; IOL intraocular lens; PCIOL posterior chamber intraocular lens; Phaco/IOL phacoemulsification with intraocular lens placement; [place] photorefractive keratectomy; LASIK laser assisted in situ keratomileusis; HTN hypertension; DM diabetes mellitus; COPD chronic obstructive pulmonary disease

## 2020-09-01 ENCOUNTER — Ambulatory Visit (INDEPENDENT_AMBULATORY_CARE_PROVIDER_SITE_OTHER): Payer: Medicaid Other | Admitting: Ophthalmology

## 2020-09-01 ENCOUNTER — Other Ambulatory Visit: Payer: Self-pay

## 2020-09-01 ENCOUNTER — Encounter (INDEPENDENT_AMBULATORY_CARE_PROVIDER_SITE_OTHER): Payer: Self-pay | Admitting: Ophthalmology

## 2020-09-01 DIAGNOSIS — H3589 Other specified retinal disorders: Secondary | ICD-10-CM | POA: Diagnosis not present

## 2020-09-01 DIAGNOSIS — H5213 Myopia, bilateral: Secondary | ICD-10-CM | POA: Diagnosis not present

## 2020-09-01 DIAGNOSIS — H35413 Lattice degeneration of retina, bilateral: Secondary | ICD-10-CM | POA: Diagnosis not present

## 2020-09-01 DIAGNOSIS — Z8619 Personal history of other infectious and parasitic diseases: Secondary | ICD-10-CM

## 2020-09-01 DIAGNOSIS — H3581 Retinal edema: Secondary | ICD-10-CM | POA: Diagnosis not present

## 2020-09-01 DIAGNOSIS — H40051 Ocular hypertension, right eye: Secondary | ICD-10-CM

## 2020-09-01 DIAGNOSIS — H2513 Age-related nuclear cataract, bilateral: Secondary | ICD-10-CM

## 2020-09-06 ENCOUNTER — Other Ambulatory Visit: Payer: Self-pay

## 2020-09-06 ENCOUNTER — Ambulatory Visit (INDEPENDENT_AMBULATORY_CARE_PROVIDER_SITE_OTHER): Payer: Medicaid Other | Admitting: Podiatry

## 2020-09-06 ENCOUNTER — Encounter: Payer: Self-pay | Admitting: Podiatry

## 2020-09-06 DIAGNOSIS — M79675 Pain in left toe(s): Secondary | ICD-10-CM | POA: Diagnosis not present

## 2020-09-06 DIAGNOSIS — B351 Tinea unguium: Secondary | ICD-10-CM

## 2020-09-06 DIAGNOSIS — G63 Polyneuropathy in diseases classified elsewhere: Secondary | ICD-10-CM

## 2020-09-06 DIAGNOSIS — M79674 Pain in right toe(s): Secondary | ICD-10-CM | POA: Diagnosis not present

## 2020-09-11 NOTE — Progress Notes (Signed)
  Subjective:  Patient ID: Eric Stephens, male    DOB: 1980-12-13,  MRN: [id number]  Eric Stephens presents to clinic today for painful thick toenails that are difficult to trim. Pain interferes with ambulation. Aggravating factors include wearing enclosed shoe gear. Pain is relieved with periodic professional debridement..  40 y.o. male presents with the above complaint.   Review of Systems: Negative except as noted in the HPI  .No Known Allergies    Objective:   Constitutional Eric Stephens is a pleasant 71 y.o. African American male, in NAD. AAO x 3.   Vascular Capillary refill time to digits immediate b/l. Palpable pedal pulses b/l LE. Pedal hair present. Lower extremity skin temperature gradient within normal limits. No cyanosis or clubbing noted.  Neurologic Normal speech. Oriented to person, place, and time. Protective sensation intact 5/5 intact bilaterally with 10g monofilament b/l. Vibratory sensation intact b/l.  Dermatologic Pedal skin with normal turgor, texture and tone bilaterally. No open wounds bilaterally. No interdigital macerations bilaterally. Toenails 1-5 b/l elongated, discolored, dystrophic, thickened, crumbly with subungual debris and tenderness to dorsal palpation. Hyperkeratotic lesion(s) submet head 2 left foot and submet head 2 right foot.  No erythema, no edema, no drainage, no fluctuance.  Orthopedic: Normal muscle strength 5/5 to all lower extremity muscle groups bilaterally. No pain crepitus or joint limitation noted with ROM b/l. Hammertoes noted to the L 2nd toe and R 2nd toe. Patient ambulates independent of any assistive aids.   Radiographs: None Assessment:   1. Pain due to onychomycosis of toenails of both feet   2. Polyneuropathy associated with underlying disease (HCC)    Plan:  Patient was evaluated and treated and all questions answered.  Onychomycosis with pain -Nails palliatively debridement as below -Educated on self-care  Procedure: Nail  Debridement Rationale: Pain Type of Debridement: manual, sharp debridement. Instrumentation: Nail nipper, rotary burr. Number of Nails: 10 -Examined patient. -ABN signed for refusal of calluses for 2022. -Patient to continue soft, supportive shoe gear daily. -Toenails 1-5 b/l were debrided in length and girth with sterile nail nippers and dremel without iatrogenic bleeding.  -Patient to report any pedal injuries to medical professional immediately. -Patient/POA to call should there be question/concern in the interim.  Return in about 3 months (around 12/07/2020).  Eric Stephens, DPM

## 2020-09-24 ENCOUNTER — Other Ambulatory Visit: Payer: Self-pay | Admitting: Internal Medicine

## 2020-09-24 NOTE — Telephone Encounter (Signed)
Next appt scheduled 11/23/20 with PCP.

## 2020-11-13 ENCOUNTER — Encounter: Payer: Self-pay | Admitting: *Deleted

## 2020-11-22 NOTE — Progress Notes (Signed)
  [facility] Residency Telephone Encounter Continuity Care Appointment  HPI:   This telephone encounter was created for [name] on 11/23/2020 for the following purpose/cc: facility placement   Past Medical History:  Past Medical History:  Diagnosis Date  . Asthma   . Cataract    NS OU  . Glaucoma    POAG OU  . Syphilis       ROS:   Not applicable   Assessment / Plan / Recommendations:   Please see A&P under problem oriented charting for assessment of the patient's acute and chronic medical conditions.   As always, pt is advised that if symptoms worsen or new symptoms arise, they should go to an urgent care facility or to to ER for further evaluation.   Consent and Medical Decision Making:   Patient discussed with Dr. Oswaldo Done  This is a telephone encounter between [name], the patient Eric Stephens father and Eric Stephens on 11/23/2020 for discussion regarding facility placement. The visit was conducted with the patient and his father located at home and Eric Stephens at [facility]. The patient's identity was confirmed using their DOB and current address. The patient's legal guardian has consented to being evaluated through a telephone encounter and understands the associated risks (an examination cannot be done and the patient may need to come in for an appointment) / benefits (allows the patient to remain at home, decreasing exposure to coronavirus). I personally spent 15 minutes on medical discussion.

## 2020-11-23 ENCOUNTER — Other Ambulatory Visit: Payer: Self-pay

## 2020-11-23 ENCOUNTER — Ambulatory Visit (INDEPENDENT_AMBULATORY_CARE_PROVIDER_SITE_OTHER): Payer: Medicaid Other | Admitting: Student

## 2020-11-23 ENCOUNTER — Telehealth: Payer: Self-pay | Admitting: *Deleted

## 2020-11-23 DIAGNOSIS — F02818 Dementia in other diseases classified elsewhere, unspecified severity, with other behavioral disturbance: Secondary | ICD-10-CM

## 2020-11-23 DIAGNOSIS — F0281 Dementia in other diseases classified elsewhere with behavioral disturbance: Secondary | ICD-10-CM

## 2020-11-23 NOTE — Assessment & Plan Note (Signed)
Telehealth encounter with patient's father today. Appointment was switched from in-person evaluation to telehealth visit due to transportation issue.  Patient's father states he and the patient's mother are still seeking placement for their son. As noted in last clinic visit from 07/26/20, the patient has reportedly had increasing forgetfulness over the past several years. Regarding his ADLs, he is able to bathe himself, feed himself, dress himself, and use the toilet on his own, however he requires very close supervision while cooking since he will leave burners on and misuse ingredients. Regarding his iADLs, he can only shop for groceries with close supervision as he will buy incorrect/inappropriate items. Similarly, he requires close supervision with laundry as he will overfill the laundry machine and use too much laundry detergent. His medications and finances are managed by his parents. He is able to use transportation (bus) on his own, however has gotten lost when out by himself. He does not drive. The patient's father states as he and his wife age (they are in their mid-70s), it is getting increasingly difficult for them to supervise the patient to the extent he requires.   On chart review, the family worked closely with CCM in 03/2020 to find ALF placement however all local options were reportedly exhausted and no offers resulted.  A/P: Discussed with patient's father that I would re-refer patient for CCM/social work needs to explore additional options for placement.  - Re-referral to CCM, social work

## 2020-11-23 NOTE — Chronic Care Management (AMB) (Signed)
  Care Management   Note  11/23/2020 Name: KAMONI GENTLES MRN: [id number] DOB: 1980/06/24  GARRUS GAUTHREAUX is a 40 y.o. year old male who is a primary care patient of Alphonzo Severance, MD. I reached out to Barkley Bruns by phone today in response to a referral sent by Mr. Fard [facility] PCP, Dr. Claudette Laws.    Mr. Westrup was given information about care management services today including:  1. Care management services include personalized support from designated clinical staff supervised by his physician, including individualized plan of care and coordination with other care providers 2. 24/7 contact phone numbers for assistance for urgent and routine care needs. 3. The patient may stop care management services at any time by phone call to the office staff.  Patient agreed to services and verbal consent obtained.   Follow up plan: Telephone appointment with care management team member scheduled for:11/26/2020  [facility] Guide, Embedded Care Coordination [facility] Management

## 2020-11-26 ENCOUNTER — Ambulatory Visit: Payer: Medicaid Other | Admitting: Licensed Clinical Social Worker

## 2020-11-29 NOTE — Addendum Note (Signed)
Addended by: Erlinda Hong T on: 11/29/2020 08:11 AM   Modules accepted: Level of Service

## 2020-11-29 NOTE — Progress Notes (Signed)
Internal Medicine Clinic Attending  Case discussed with Dr. Watson  At the time of the visit.  We reviewed the resident's history and exam and pertinent patient test results.  I agree with the assessment, diagnosis, and plan of care documented in the resident's note.  

## 2020-11-30 NOTE — Patient Instructions (Signed)
Visit Information  Instructions: patient will work with SW to address concerns related to level of care.  Patient was given the following information about care management and care coordination services today, agreed to services, and gave verbal consent: 1.care management/care coordination services include personalized support from designated clinical staff supervised by their physician, including individualized plan of care and coordination with other care providers 2. 24/7 contact phone numbers for assistance for urgent and routine care needs. 3. The patient may stop care management/care coordination services at any time by phone call to the office staff.  Patient verbalizes understanding of instructions provided today and agrees to view in MyChart.   The care management team will reach out to the patient again over the next 30 days.   Eric Stephens, BSW  Social Worker [facility] Care Management  [phone]      

## 2020-11-30 NOTE — Chronic Care Management (AMB) (Signed)
  Care Management   Social Work Visit Note  11/30/2020 Name: Eric Stephens MRN: [id number] DOB: 13-Jun-1981  Eric Stephens is a 40 y.o. year old male who sees Alphonzo Severance, MD for primary care. The care management team was consulted for assistance with care management and care coordination needs related to Level of Care Concerns   Patient was given the following information about care management and care coordination services today, agreed to services, and gave verbal consent: 1.care management/care coordination services include personalized support from designated clinical staff supervised by their physician, including individualized plan of care and coordination with other care providers 2. 24/7 contact phone numbers for assistance for urgent and routine care needs. 3. The patient may stop care management/care coordination services at any time by phone call to the office staff.  Engaged with patient by telephone for initial visit in response to provider referral for social work chronic care management and care coordination services.  Assessment: Review of patient history, allergies, and health status during evaluation of patient need for care management/care coordination services.    Interventions:  Patient interviewed and appropriate assessments performed Collaborated with clinical team regarding patient needs  SW spoke with legal guardian and father of Eric Stephens). Mr. Vangieson advised is wanting his son to transition back to a ALF.  SW review chart. Patient moved to [facility] on 12/02/19 and only remained until 12/08/2019.  SW will collaborate with RN Care Manager to address care management and care coordination needs.  SW will complete a Fl2.   SDOH (Social Determinants of Health) assessments performed: Yes     Plan:  patient will work with BSW to address needs related to Level of care concerns Social Worker will complete Fl2 and follow up with patient within the next  30-days. Eric Stephens, BSW  Social Worker [facility] Management  [phone]

## 2020-12-15 ENCOUNTER — Ambulatory Visit: Payer: Medicaid Other | Admitting: Licensed Clinical Social Worker

## 2020-12-15 NOTE — Patient Instructions (Signed)
Visit Information  Instructions: patient will work with SW to address concerns related to level of care concerns.   Patient was given the following information about care management and care coordination services today, agreed to services, and gave verbal consent: 1.care management/care coordination services include personalized support from designated clinical staff supervised by their physician, including individualized plan of care and coordination with other care providers 2. 24/7 contact phone numbers for assistance for urgent and routine care needs. 3. The patient may stop care management/care coordination services at any time by phone call to the office staff.  Patient verbalizes understanding of instructions provided today and agrees to view in MyChart.   Telephone follow up appointment with care management team member scheduled for: 12/27/20 at 10:30 am.  Christen Butter, [facility]  Social Worker [facility] Management  [phone]

## 2020-12-15 NOTE — Chronic Care Management (AMB) (Signed)
  Care Management   Social Work Visit Note  12/15/2020 Name: Eric Stephens MRN: [id number] DOB: 06-Sep-1980  Eric Stephens is a 40 y.o. year old male who sees Dellis Filbert, MD for primary care. The care management team was consulted for assistance with care management and care coordination needs related to Level of Care Concerns   Patient was given the following information about care management and care coordination services today, agreed to services, and gave verbal consent: 1.care management/care coordination services include personalized support from designated clinical staff supervised by their physician, including individualized plan of care and coordination with other care providers 2. 24/7 contact phone numbers for assistance for urgent and routine care needs. 3. The patient may stop care management/care coordination services at any time by phone call to the office staff.   Assessment: Review of patient history, allergies, and health status during evaluation of patient need for care management/care coordination services.    Interventions:  SW contacted [facility] [phone]) and left a message. This is SW 2nd attempt with facility. SW will inquire with patient if he has a medicaid SW that can assist with placement.  SW completed a Fl2 and placed in yellow pool box. SW sent an in-basket message to yellow team.  Patient has an upcoming appointment on 12/27/20 at 10:30 am with SW.       Plan:  patient will work with BSW to address needs related to Level of care concerns   Christen Butter, BSW  Social Worker [facility] Management  [phone]

## 2020-12-21 ENCOUNTER — Ambulatory Visit: Payer: Medicaid Other | Admitting: Podiatry

## 2020-12-21 ENCOUNTER — Other Ambulatory Visit: Payer: Self-pay

## 2020-12-21 ENCOUNTER — Encounter: Payer: Self-pay | Admitting: *Deleted

## 2020-12-21 ENCOUNTER — Encounter: Payer: Self-pay | Admitting: Podiatry

## 2020-12-21 DIAGNOSIS — M79675 Pain in left toe(s): Secondary | ICD-10-CM | POA: Diagnosis not present

## 2020-12-21 DIAGNOSIS — B351 Tinea unguium: Secondary | ICD-10-CM

## 2020-12-21 DIAGNOSIS — M79674 Pain in right toe(s): Secondary | ICD-10-CM

## 2020-12-21 NOTE — Progress Notes (Signed)
Subjective: Eric Stephens is a pleasant 40 y.o. male patient seen today painful thick toenails that are difficult to trim. Pain interferes with ambulation. Aggravating factors include wearing enclosed shoe gear. Pain is relieved with periodic professional debridement.  His father is present during today's visit. They voice no new pedal problems on today's visit.  No Known Allergies  Objective: Physical Exam  General: Eric Stephens is a pleasant 40 y.o. African American male, in NAD. AAO x 3.   Vascular:  Capillary refill time to digits immediate b/l. Palpable pedal pulses b/l LE. Pedal hair present. Lower extremity skin temperature gradient within normal limits. No pain with calf compression b/l. No edema noted b/l lower extremities.  Dermatological:  Pedal skin with normal turgor, texture and tone b/l lower extremities No open wounds b/l lower extremities No interdigital macerations b/l lower extremities Toenails 1-5 b/l elongated, discolored, dystrophic, thickened, crumbly with subungual debris and tenderness to dorsal palpation.  Musculoskeletal:  Normal muscle strength 5/5 to all lower extremity muscle groups bilaterally. No pain crepitus or joint limitation noted with ROM b/l. Hammertoe(s) noted to the L 2nd toe and R 2nd toe.  Neurological:  Protective sensation intact 5/5 intact bilaterally with 10g monofilament b/l. Vibratory sensation intact b/l.  Assessment and Plan:  1. Pain due to onychomycosis of toenails of both feet     -Examined patient. -Patient to continue soft, supportive shoe gear daily. -Toenails 1-5 b/l were debrided in length and girth with sterile nail nippers and dremel without iatrogenic bleeding.  -Patient to report any pedal injuries to medical professional immediately. -Patient/POA to call should there be question/concern in the interim.  Return in about 3 months (around 03/23/2021).  Freddie Breech, DPM

## 2020-12-27 ENCOUNTER — Ambulatory Visit: Payer: Medicaid Other | Admitting: Licensed Clinical Social Worker

## 2020-12-27 NOTE — Chronic Care Management (AMB) (Signed)
  Care Management   Social Work Visit Note  12/27/2020 Name: Eric Stephens MRN: [id number] DOB: 1981-02-13  Eric Stephens is a 40 y.o. year old male who sees Dellis Filbert, MD for primary care. The care management team was consulted for assistance with care management and care coordination needs related to Level of Care Concerns   Patient was given the following information about care management and care coordination services today, agreed to services, and gave verbal consent: 1.care management/care coordination services include personalized support from designated clinical staff supervised by their physician, including individualized plan of care and coordination with other care providers 2. 24/7 contact phone numbers for assistance for urgent and routine care needs. 3. The patient may stop care management/care coordination services at any time by phone call to the office staff.  Engaged with patient by telephone for follow up visit in response to provider referral for social work chronic care management and care coordination services.  Assessment: Review of patient history, allergies, and health status during evaluation of patient need for care management/care coordination services.    Interventions:  Patient interviewed and appropriate assessments performed Collaborated with clinical team regarding patient needs  SW advised patient she is contacting different agencies regarding SNF placement. Patient father stated everything else is going well. Patient father advised no other concerns or SW interventions needed at this time.  SDOH (Social Determinants of Health) assessments performed: No     Plan:  patient will work with BSW to address needs related to Level of care concerns Social Worker will continue to contact agencies regarding SNF.  [email or web address]  Christen Butter, BSW  Social Worker [facility]   [phone]

## 2020-12-27 NOTE — Patient Instructions (Signed)
Visit Information  Instructions: patient will work with SW to address concerns related to Level of Care  Patient was given the following information about care management and care coordination services today, agreed to services, and gave verbal consent: 1.care management/care coordination services include personalized support from designated clinical staff supervised by their physician, including individualized plan of care and coordination with other care providers 2. 24/7 contact phone numbers for assistance for urgent and routine care needs. 3. The patient may stop care management/care coordination services at any time by phone call to the office staff.  Patient verbalizes understanding of instructions provided today and agrees to view in MyChart.   The care management team will reach out to the patient again over the next 30 days.   Eric Stephens, BSW  Social Worker [facility]  [phone]      

## 2021-01-17 ENCOUNTER — Other Ambulatory Visit: Payer: Self-pay | Admitting: Student

## 2021-01-19 ENCOUNTER — Ambulatory Visit: Payer: Medicaid Other | Admitting: Licensed Clinical Social Worker

## 2021-01-19 ENCOUNTER — Telehealth: Payer: Self-pay

## 2021-01-19 ENCOUNTER — Telehealth: Payer: Medicaid Other

## 2021-01-19 NOTE — Chronic Care Management (AMB) (Signed)
  Care Management   Social Work Visit Note  01/19/2021 Name: ADRON GEISEL MRN: [id number] DOB: [birth year]  SHINICHI ANGUIANO is a 40 y.o. year old male who sees Dellis Filbert, MD for primary care. The care management team was consulted for assistance with care management and care coordination needs related to Level of Care Concerns   Patient was given the following information about care management and care coordination services today, agreed to services, and gave verbal consent: 1.care management/care coordination services include personalized support from designated clinical staff supervised by their physician, including individualized plan of care and coordination with other care providers 2. 24/7 contact phone numbers for assistance for urgent and routine care needs. 3. The patient may stop care management/care coordination services at any time by phone call to the office staff.  Engaged with patient by telephone for follow up visit in response to provider referral for social work chronic care management and care coordination services.  Assessment: Review of patient history, allergies, and health status during evaluation of patient need for care management/care coordination services.    Interventions:  Patient interviewed and appropriate assessments performed Collaborated with clinical team regarding patient needs  SW spoke with patients father- Zeferino Mounts. Mr. Hamada advised he had medical questions and needed to speak with RNCM. SW sent a in basket message for nurse to outreach patient.  Mr. Martucci is wanting placement for his son. SW will continue efforts to locate placement. Mr. Ewart advised no other resources or interventions are needed at this time.      Plan:  patient will work with BSW to address needs related to Level of care concerns SW will follow up with patient within 30-days.  Christen Butter, BSW  Social Worker [facility]  [phone]

## 2021-01-19 NOTE — Patient Instructions (Signed)
Visit Information  Instructions: patient will work with SW to address concerns related to level of care.  Patient was given the following information about care management and care coordination services today, agreed to services, and gave verbal consent: 1.care management/care coordination services include personalized support from designated clinical staff supervised by their physician, including individualized plan of care and coordination with other care providers 2. 24/7 contact phone numbers for assistance for urgent and routine care needs. 3. The patient may stop care management/care coordination services at any time by phone call to the office staff.  Patient verbalizes understanding of instructions provided today and agrees to view in MyChart.   The care management team will reach out to the patient again over the next 30 days.   Keondre Markson, BSW  Social Worker [facility]  [phone]      

## 2021-01-19 NOTE — Telephone Encounter (Signed)
  Chronic Care Management   Note  01/19/2021 Name: Eric Stephens MRN: [id number] DOB: 03/13/1981  Received a request for RNCM to contact patients father from CCM SW.  Call to patient's father Eric Stephens, verified with two identifiers, and discussed his concerns.  [name] had a question whether his son is pre-diabetic.  Reviewed the lab results from July 23, 2020 at last office visit and patient had a CBG of 102 and an A1C was completed with a result of 6.2.  Educated on the criteria for being "prediabetic" vs "diabetic" and informed him the physician would be monitoring at the next office visit.  He brought up the question because his son went to the podiatrist and he was told his son was prediabetic and he had never heard that before and he was concerned.    [name] had no further questions and no needs for [facility] to continue to follow.  He will call if his son's needs change and he needs RNCM to follow.  Jodelle Gross, RN, BSN, CCM Care Management Coordinator [facility] Internal Medicine Phone: [phone] / Fax: [phone]

## 2021-03-23 ENCOUNTER — Other Ambulatory Visit: Payer: Self-pay | Admitting: Student

## 2021-03-29 ENCOUNTER — Ambulatory Visit: Payer: Medicaid Other | Admitting: Podiatry

## 2021-05-25 ENCOUNTER — Telehealth: Payer: Self-pay

## 2021-05-25 NOTE — Telephone Encounter (Signed)
#  90 with 1 refill sent 10/7. Patient's father notified to contact [facility] for refill. He will call back if there are any issues filling this.

## 2021-05-25 NOTE — Telephone Encounter (Signed)
cetirizine (ZYRTEC) 10 MG tablet, refill request @ [id number] - [place], [place] - [address] AT [address] OF [address].

## 2021-06-23 ENCOUNTER — Encounter: Payer: Self-pay | Admitting: Internal Medicine

## 2021-06-23 ENCOUNTER — Other Ambulatory Visit: Payer: Self-pay

## 2021-06-23 ENCOUNTER — Ambulatory Visit: Payer: Medicaid Other | Admitting: Internal Medicine

## 2021-06-23 VITALS — BP 106/69 | HR 71 | Temp 97.8°F | Ht 71.0 in | Wt 267.7 lb

## 2021-06-23 DIAGNOSIS — J029 Acute pharyngitis, unspecified: Secondary | ICD-10-CM

## 2021-06-23 DIAGNOSIS — R0609 Other forms of dyspnea: Secondary | ICD-10-CM | POA: Diagnosis not present

## 2021-06-23 DIAGNOSIS — R0683 Snoring: Secondary | ICD-10-CM

## 2021-06-23 HISTORY — DX: Acute pharyngitis, unspecified: J02.9

## 2021-06-23 MED ORDER — ALBUTEROL SULFATE HFA 108 (90 BASE) MCG/ACT IN AERS: 2.0000 | INHALATION_SPRAY | Freq: Four times a day (QID) | RESPIRATORY_TRACT | 1 refills | Status: DC | PRN

## 2021-06-23 NOTE — Assessment & Plan Note (Signed)
Patient here with his father today who reports patient has been short of breath and with heavy breathing with exertion for the past several months. He had a history of asthma as a kid but no PFTs on file or recent inhaler use. He has not noticed any alleviating or aggravating factors outside of exertion and rest, respectively. He has a dry cough. He does smoke cigarettes daily, states several a day for the past five years. He is interested in quitting smoking. He denies any chest pain, diaphoresis, orthopnea, leg swelling.   Cardiac exam unremarkable. No LE swelling or JVD. Lung exam did demonstrate some bibasilar coarse lung sounds. Otherwise unremarkable.   Assessment/plan: Differential diagnosis includes asthma/copd vs cardiac etiology. Prescribed albuterol to help with symptom management and ordered PFTs. Encouraged tobacco cessation as well.  - Follow up PFTs - Albuterol prn - Tobacco cessation encouraged, recommended nicotine gum/patches. Can consider chantix in the future

## 2021-06-23 NOTE — Assessment & Plan Note (Signed)
Patient reports sore throat for the past two days along with a dry cough and mild nasal congestion. Denies fevers, chills, headaches, eye itchiness/discharge/redness, ear pain, sinus pain, nausea or vomiting. He does endorse dyspnea on exertion as well. States it has been hard to swallow foods due to pain in his throat. He is vaccinated against covid x4. He has not received the flu vaccine this year. Denies any sick contacts or body aches. He has not tried any over the counter remedies.  Assessment/plan: Recommended supportive care for his sore throat and dry cough. Suspect viral etiology.   -Follow up covid/flu swab -Recommended OTC remedies such as lozenges, cough syrup and/or chloraseptic spray  -Patient to return to [facility] if symptoms persist or worsen

## 2021-06-23 NOTE — Patient Instructions (Addendum)
It was a pleasure meeting you today!  I will call you with the results of your covid/flu swab.  For your sore throat I recommend over the counter medications like lozanges, cough syrup and chloraseptic spray.   For your shortness of breath you can try taking albuterol inhaler, take 1-2 puff every 6 hours as needed.   I have also ordered a sleep study and pulmonary function tests to assess your shortness of breath further.  I encourage you to try nicotine gum and patches to help quit smoking!

## 2021-06-23 NOTE — Progress Notes (Signed)
° °  CC: sore throat, heavy breathing  HPI:  Eric Stephens is a 41 y.o. with a PMHx listed below presenting for evaluation of sore throat and heavy breathing. For details of today's visit and the status of his chronic medical issues please refer to the assessment and plan.   Past Medical History:  Diagnosis Date   Asthma    Cataract    NS OU   Glaucoma    POAG OU   Syphilis    Review of Systems:   Review of Systems  Constitutional:  Negative for chills, fever and malaise/fatigue.  HENT:  Positive for congestion and sore throat. Negative for ear pain and sinus pain.   Eyes:  Negative for blurred vision, double vision, pain, discharge and redness.  Respiratory:  Positive for cough and shortness of breath. Negative for sputum production and wheezing.   Cardiovascular:  Negative for chest pain, palpitations, orthopnea and leg swelling.  Gastrointestinal:  Negative for abdominal pain, nausea and vomiting.  Neurological:  Negative for headaches.    Physical Exam:  Vitals:   06/23/21 0948  BP: 106/69  Pulse: 71  Temp: 97.8 F (36.6 C)  TempSrc: Oral  SpO2: 98%  Weight: 267 lb 11.2 oz (121.4 kg)  Height: 5\' 11"  (1.803 m)   Physical Exam General: alert, appears stated age, in no acute distress HEENT: Normocephalic, atraumatic, EOM intact, conjunctiva normal, normal nasal turbinates, no cervical lymphadenopathy CV: Regular rate and rhythm, no murmurs rubs or gallops Pulm: upper lung fields clear to auscultation, b/l lower fields with coarse lung sounds, normal work of breathing Abdomen: Soft, nondistended, bowel sounds present, no tenderness to palpation MSK: No lower extremity edema Skin: Warm and dry Neuro: Alert and oriented x3   Assessment & Plan:   See Encounters Tab for problem based charting.  Patient discussed with Dr.  

## 2021-06-23 NOTE — Assessment & Plan Note (Signed)
Patient endorses heavy snoring at night time as well as waking up gasping for air, reported by father. He denies feeling sleepy/drowsy during the day and no morning headaches. Given his symptoms and obesity he is at risk for OSA. Will refer to pulmonology for a sleep study.  - Follow up sleep study

## 2021-06-24 LAB — COVID-19, FLU A+B NAA
Influenza A, NAA: NOT DETECTED
Influenza B, NAA: NOT DETECTED
SARS-CoV-2, NAA: NOT DETECTED

## 2021-06-24 NOTE — Progress Notes (Signed)
Internal Medicine Clinic Attending ° °Case discussed with Dr. Rehman  At the time of the visit.  We reviewed the resident’s history and exam and pertinent patient test results.  I agree with the assessment, diagnosis, and plan of care documented in the resident’s note.  ° °

## 2021-06-30 ENCOUNTER — Ambulatory Visit: Payer: Medicaid Other | Admitting: Licensed Clinical Social Worker

## 2021-06-30 NOTE — Patient Instructions (Signed)
Visit Information  Instructions: patient will work with SW to address concerns related to level of care.  Patient was given the following information about care management and care coordination services today, agreed to services, and gave verbal consent: 1.care management/care coordination services include personalized support from designated clinical staff supervised by their physician, including individualized plan of care and coordination with other care providers 2. 24/7 contact phone numbers for assistance for urgent and routine care needs. 3. The patient may stop care management/care coordination services at any time by phone call to the office staff.  Patient verbalizes understanding of instructions and care plan provided today and agrees to view in [place]. Active MyChart status confirmed with patient.    The care management team will reach out to the patient again over the next 7 days.   Milus Height, [place]  Social Worker [facility]  [phone]

## 2021-06-30 NOTE — Chronic Care Management (AMB) (Signed)
°  Care Management   Social Work Visit Note  06/30/2021 Name: Eric Stephens MRN: [id number] DOB: [birth year]  Eric Stephens is a 41 y.o. year old male who sees Dellis Filbert, MD for primary care. The care management team was consulted for assistance with care management and care coordination needs related to Level of Care Concerns   Patient was given the following information about care management and care coordination services today, agreed to services, and gave verbal consent: 1.care management/care coordination services include personalized support from designated clinical staff supervised by their physician, including individualized plan of care and coordination with other care providers 2. 24/7 contact phone numbers for assistance for urgent and routine care needs. 3. The patient may stop care management/care coordination services at any time by phone call to the office staff.  Engaged with patient by telephone for follow up visit in response to provider referral for social work chronic care management and care coordination services.  Assessment: Review of patient history, allergies, and health status during evaluation of patient need for care management/care coordination services.    Interventions:  Patient interviewed and appropriate assessments performed Collaborated with clinical team regarding patient needs  Patient has not heard back from any ALF. SW advised patient to contact [facility] [phone]) to see if they will accept patient back. SW will follow up with patient on 07/04/2021.   SDOH (Social Determinants of Health) assessments performed: Yes     Plan:  patient will work with BSW to address needs related to Level of care concerns Social Worker will follow up within 7 days.   Christen Butter, BSW  Social Worker [facility]  [phone]

## 2021-07-04 ENCOUNTER — Telehealth: Payer: Medicaid Other

## 2021-07-06 ENCOUNTER — Ambulatory Visit: Payer: Self-pay | Admitting: Licensed Clinical Social Worker

## 2021-07-18 ENCOUNTER — Emergency Department (HOSPITAL_COMMUNITY)
Admission: EM | Admit: 2021-07-18 | Discharge: 2021-07-18 | Disposition: A | Discharge status: Home / Self Care | Payer: Medicaid Other | Attending: Emergency Medicine | Admitting: Emergency Medicine

## 2021-07-18 DIAGNOSIS — X58XXXA Exposure to other specified factors, initial encounter: Secondary | ICD-10-CM | POA: Insufficient documentation

## 2021-07-18 DIAGNOSIS — S0181XA Laceration without foreign body of other part of head, initial encounter: Secondary | ICD-10-CM | POA: Insufficient documentation

## 2021-07-18 MED ORDER — DOUBLE ANTIBIOTIC 500-10000 UNIT/GM EX OINT
TOPICAL_OINTMENT | CUTANEOUS | Status: AC
  Administered 2021-07-18: 1 via TOPICAL
  Filled 2021-07-18: qty 28.4

## 2021-07-18 NOTE — ED Provider Notes (Signed)
[facility] EMERGENCY DEPARTMENT Provider Note   CSN: [id number] Arrival date & time: 07/18/21  0818     History  Chief Complaint  Patient presents with   Head Laceration    Eric Stephens is a 41 y.o. male.  HPI    41 year old male presents with his father with report of wound to right forehead.  He had a cut to his head this morning while adjusting some blinds at home.  There was no fall no loss of conscious.  He showered and washed the wound out.  No other care has been given to the wound.  His tetanus is up-to-date within the past several years.  He denies any other injuries, headache, vision changes, neck pain or other injuries Home Medications Prior to Admission medications   Medication Sig Start Date End Date Taking? Authorizing Provider  albuterol (PROVENTIL HFA) 108 (90 Base) MCG/ACT inhaler Inhale 2 puffs into the lungs every 6 (six) hours as needed for wheezing or shortness of breath. 06/23/21   Rehman, Areeg N, DO  cetirizine (ZYRTEC) 10 MG tablet TAKE 1 TABLET BY MOUTH EVERY DAY AS NEEDED FOR ALLERGY 03/25/21   Dellis Filbert, MD  COMBIGAN 0.2-0.5 % ophthalmic solution Apply 1 drop to eye 2 (two) times daily. 08/01/20   [provider]  gabapentin (NEURONTIN) 800 MG tablet TAKE 1 TABLET TWICE A DAY FOR ANXIETY AND IRRITABLITY 07/15/18   [provider]  Olopatadine HCl 0.2 % SOLN Apply 1 drop to eye every morning. 12/08/19   [provider]  RESTASIS 0.05 % ophthalmic emulsion 1 drop 2 (two) times daily. 12/06/19   [provider]  traZODone (DESYREL) 100 MG tablet GIVE 1 TABLET BY MOUTH AT BEDTIME FOR SLEEP 08/05/20   [provider]  zolpidem (AMBIEN) 10 MG tablet Take 10 mg by mouth at bedtime as needed.    [provider]      Allergies    Patient has no known allergies.    Review of Systems   Review of Systems  All other systems reviewed and are negative.  Physical Exam Updated Vital Signs BP  129/82 (BP Location: Right Arm)    Pulse 76    Temp 99.1 F (37.3 C) (Oral)    Resp 17    SpO2 99%  Physical Exam Vitals and nursing note reviewed.  Constitutional:      Appearance: He is well-developed.  HENT:     Head: Normocephalic.     Comments: 1 cm crescent-shaped laceration right temple not through and through skin with very small amount of dried blood    Right Ear: External ear normal.     Left Ear: External ear normal.     Nose: Nose normal.  Eyes:     Extraocular Movements: Extraocular movements intact.  Neck:     Trachea: No tracheal deviation.  Pulmonary:     Effort: Pulmonary effort is normal.  Musculoskeletal:        General: Normal range of motion.  Skin:    General: Skin is warm and dry.  Neurological:     Mental Status: He is alert and oriented to person, place, and time.  Psychiatric:        Mood and Affect: Mood normal.        Behavior: Behavior normal.    ED Results / Procedures / Treatments   Labs (all labs ordered are listed, but only abnormal results are displayed) Labs Reviewed - No data to display  EKG None  Radiology No results found.  Procedures Procedures    Medications Ordered in ED Medications  polymixin-bacitracin (POLYSPORIN) ointment (has no administration in time range)    ED Course/ Medical Decision Making/ A&P                           Medical Decision Making Risk OTC drugs.  Patient with wound to forehead from blinds this morning.  There is no loss of conscious.  There is mild swelling around it.  The wound appears clean and how was showered prior to evaluation.  There is no gaping and does not appear to need to be closed.  Plan antibiotic ointment and bandage.  Patient's tetanus is up-to-date. Discussed return precautions and the need for follow-up with patient and his father and they voiced understanding. Final Clinical Impression(s) / ED Diagnoses Final diagnoses:  Facial laceration, initial encounter    Rx / DC  Orders ED Discharge Orders     None         Margarita Grizzle, MD 07/18/21 [phone]

## 2021-07-18 NOTE — ED Notes (Signed)
Pt has a small lac approx 0.5cm and a hematoma where the lac is on the frontal right side of head approx the size of a half dollar.

## 2021-07-18 NOTE — ED Triage Notes (Signed)
Patient here for evaluation of approximately 1cm right forehead laceration that occurred this morning while pulling on window blinds. The blinds detached from the window and hit him in the forehead, no LOC, hemorrhage controlled. Patient alert, oriented, and in no apparent distress at this time.

## 2021-07-18 NOTE — ED Notes (Signed)
I cleaned pt's lac w/NS and applied polysporin and a bandaid

## 2021-07-21 NOTE — Patient Instructions (Signed)
Visit Information ° °Instructions: patient will work with SW to address concerns related to level of care. ° °Patient was given the following information about care management and care coordination services today, agreed to services, and gave verbal consent: 1.care management/care coordination services include personalized support from designated clinical staff supervised by their physician, including individualized plan of care and coordination with other care providers 2. 24/7 contact phone numbers for assistance for urgent and routine care needs. 3. The patient may stop care management/care coordination services at any time by phone call to the office staff. ° °Patient verbalizes understanding of instructions and care plan provided today and agrees to view in MyChart. Active MyChart status confirmed with patient.   ° °The care management team will reach out to the patient again over the next 60 days.  ° °Alisah Grandberry, BSW  °Social Worker °[facility]  °[phone] °  ° °  °

## 2021-07-21 NOTE — Chronic Care Management (AMB) (Signed)
°   Social Work Note  07/21/2021 Name: Eric Stephens MRN: [id number] DOB: [birth year]  Eric Stephens is a 41 y.o. year old male who is a primary care patient of Eric Filbert, MD.  The Care Management team was consulted for assistance with chronic disease management and care coordination needs.  Mr. Eichelberger was given information about Care Management services today including:  Care Management services include personalized support from designated clinical staff supervised by his physician, including individualized plan of care and coordination with other care providers 24/7 contact phone numbers for assistance for urgent and routine care needs. The patient may stop care management services at any time (effective at the end of the month) by phone call to the office staff.  Patient agreed to services and consent obtained.   Engaged with patient by telephone for follow up visit in response to provider referral for social work chronic care management and care coordination services.  Assessment: Review of patient past medical history, allergies, medications, and health status, including review of pertinent consultant reports was performed as part of comprehensive evaluation and provision of care management/care coordination services.   SDOH (Social Determinants of Health) assessments and interventions performed:  No    Advanced Directives Status: See Care Plan for related entries.  Care Plan  No Known Allergies  Outpatient Encounter Medications as of 07/06/2021  Medication Sig   albuterol (PROVENTIL HFA) 108 (90 Base) MCG/ACT inhaler Inhale 2 puffs into the lungs every 6 (six) hours as needed for wheezing or shortness of breath.   cetirizine (ZYRTEC) 10 MG tablet TAKE 1 TABLET BY MOUTH EVERY DAY AS NEEDED FOR ALLERGY   COMBIGAN 0.2-0.5 % ophthalmic solution Apply 1 drop to eye 2 (two) times daily.   gabapentin (NEURONTIN) 800 MG tablet TAKE 1 TABLET TWICE A DAY FOR ANXIETY AND IRRITABLITY    Olopatadine HCl 0.2 % SOLN Apply 1 drop to eye every morning.   RESTASIS 0.05 % ophthalmic emulsion 1 drop 2 (two) times daily.   traZODone (DESYREL) 100 MG tablet GIVE 1 TABLET BY MOUTH AT BEDTIME FOR SLEEP   zolpidem (AMBIEN) 10 MG tablet Take 10 mg by mouth at bedtime as needed.   No facility-administered encounter medications on file as of 07/06/2021.    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    Follow Up Plan: SW learned previous placement [facility] is no longer in business. SW will continue assisting patient with locating placement. SW will follow up with patient by phone over the next 60 days.      Christen Butter, BSW  Social Worker [facility]  [phone]

## 2021-07-24 ENCOUNTER — Other Ambulatory Visit: Payer: Self-pay | Admitting: Student

## 2021-08-24 ENCOUNTER — Ambulatory Visit (INDEPENDENT_AMBULATORY_CARE_PROVIDER_SITE_OTHER): Payer: Medicaid Other | Admitting: Podiatry

## 2021-08-24 ENCOUNTER — Encounter: Payer: Self-pay | Admitting: Podiatry

## 2021-08-24 DIAGNOSIS — M79674 Pain in right toe(s): Secondary | ICD-10-CM

## 2021-08-24 DIAGNOSIS — B351 Tinea unguium: Secondary | ICD-10-CM

## 2021-08-24 DIAGNOSIS — M79675 Pain in left toe(s): Secondary | ICD-10-CM

## 2021-08-31 NOTE — Progress Notes (Signed)
?  Subjective:  ?Patient ID: Eric Stephens, male    DOB: November 22, 1980,  MRN: [id number] ? ?Eric Stephens presents to clinic today for painful elongated mycotic toenails 1-5 bilaterally which are tender when wearing enclosed shoe gear. Pain is relieved with periodic professional debridement. ? ?He is accompanied by his father on today's visit. ? ?New problem(s): None.  ? ?PCP is Dellis Filbert, MD. ? ?No Known Allergies ? ?Review of Systems: Negative except as noted in the HPI. ? ?Objective: No changes noted in today's physical examination. ?General: Eric Stephens is a pleasant 41 y.o. African American male, in NAD. AAO x 3.  ? ?Vascular:  ?Capillary refill time to digits immediate b/l. Palpable pedal pulses b/l LE. Pedal hair present. Lower extremity skin temperature gradient within normal limits. No pain with calf compression b/l. No edema noted b/l lower extremities. ? ?Dermatological:  ?Pedal skin with normal turgor, texture and tone b/l lower extremities No open wounds b/l lower extremities No interdigital macerations b/l lower extremities Toenails 1-5 b/l elongated, discolored, dystrophic, thickened, crumbly with subungual debris and tenderness to dorsal palpation. ? ?Musculoskeletal:  ?Normal muscle strength 5/5 to all lower extremity muscle groups bilaterally. No pain crepitus or joint limitation noted with ROM b/l. Hammertoe(s) noted to the L 2nd toe and R 2nd toe. ? ?Neurological:  ?Protective sensation intact 5/5 intact bilaterally with 10g monofilament b/l. Vibratory sensation intact b/l. ? ?Assessment/Plan: ?1. Pain due to onychomycosis of toenails of both feet   ?  ?-No new findings. No new orders. ?-Mycotic toenails 1-5 bilaterally were debrided in length and girth with sterile nail nippers and dremel without incident. ?-Patient/POA to call should there be question/concern in the interim.  ? ?Return in about 3 months (around 11/24/2021). ? ?Freddie Breech, DPM  ?

## 2021-09-14 ENCOUNTER — Institutional Professional Consult (permissible substitution): Payer: Medicaid Other | Admitting: Pulmonary Disease

## 2021-10-04 ENCOUNTER — Encounter: Payer: Self-pay | Admitting: Pulmonary Disease

## 2021-10-04 ENCOUNTER — Ambulatory Visit (INDEPENDENT_AMBULATORY_CARE_PROVIDER_SITE_OTHER): Payer: Medicaid Other | Admitting: Pulmonary Disease

## 2021-10-04 VITALS — BP 122/80 | HR 69 | Temp 98.1°F | Ht 71.0 in | Wt 273.6 lb

## 2021-10-04 DIAGNOSIS — R0683 Snoring: Secondary | ICD-10-CM | POA: Diagnosis not present

## 2021-10-04 NOTE — Progress Notes (Signed)
? ?      ?Eric Stephens    [id number]    Nov 03, 1980 ? ?Primary Care Physician:Ariwodo, Talbert Forest, MD ? ?Referring Physician: Dellis Filbert, MD ?[address] ?[place],  [place] [address] ? ?Chief complaint:   ?Patient being seen for concern for sleep apnea ? ?HPI: ? ?Accompanied by his father ? ?History of snoring, witnessed apneas, choking episodes at night ? ?Longstanding problem ? ?Difficulty sleeping led to him being on Ambien started about 3 4 years ago ?-Ambien does help her sleep ? ?Usually tries to go to bed between 830 and 9 ?Wake up time about 6:30 AM ?May have 1 or 2 awakenings ? ?No family history of sleep apnea ? ?No history of lung disease, no history of heart disease ? ?History of schizophrenia, history of insomnia ? ?Outpatient Encounter Medications as of 10/04/2021  ?Medication Sig  ? cetirizine (ZYRTEC) 10 MG tablet TAKE 1 TABLET BY MOUTH EVERY DAY AS NEEDED FOR ALLERGY  ? COMBIGAN 0.2-0.5 % ophthalmic solution Apply 1 drop to eye 2 (two) times daily.  ? gabapentin (NEURONTIN) 800 MG tablet TAKE 1 TABLET TWICE A DAY FOR ANXIETY AND IRRITABLITY  ? Olopatadine HCl 0.2 % SOLN Apply 1 drop to eye every morning.  ? RESTASIS 0.05 % ophthalmic emulsion 1 drop 2 (two) times daily.  ? traZODone (DESYREL) 100 MG tablet GIVE 1 TABLET BY MOUTH AT BEDTIME FOR SLEEP  ? zolpidem (AMBIEN) 10 MG tablet Take 10 mg by mouth at bedtime as needed.  ? [DISCONTINUED] albuterol (PROVENTIL HFA) 108 (90 Base) MCG/ACT inhaler Inhale 2 puffs into the lungs every 6 (six) hours as needed for wheezing or shortness of breath. (Patient not taking: Reported on 10/04/2021)  ? ?No facility-administered encounter medications on file as of 10/04/2021.  ? ? ?Allergies as of 10/04/2021  ? (No Known Allergies)  ? ? ?Past Medical History:  ?Diagnosis Date  ? Asthma   ? Bilateral lower extremity edema 02/06/2017  ? Cataract   ? NS OU  ? Glaucoma   ? POAG OU  ? Syphilis   ? Upper respiratory tract infection 07/03/2017  ? ? ?Past Surgical  History:  ?Procedure Laterality Date  ? WRIST SURGERY    ? ? ?Family History  ?Problem Relation Age of Onset  ? Hypertension Father   ? Hypertension Mother   ? Arthritis Mother   ? ? ?Social History  ? ?Socioeconomic History  ? Marital status: Single  ?  Spouse name: Not on file  ? Number of children: Not on file  ? Years of education: Not on file  ? Highest education level: Not on file  ?Occupational History  ? Not on file  ?Tobacco Use  ? Smoking status: Some Days  ?  Packs/day: 0.50  ?  Types: Cigarettes  ? Smokeless tobacco: Never  ? Tobacco comments:  ?  .5 pks per day   ?Vaping Use  ? Vaping Use: Never used  ?Substance and Sexual Activity  ? Alcohol use: No  ? Drug use: No  ? Sexual activity: Yes  ?Other Topics Concern  ? Not on file  ?Social History Narrative  ? Not on file  ? ?Social Determinants of Health  ? ?Financial Resource Strain: Low Risk   ? Difficulty of Paying Living Expenses: Not hard at all  ?Food Insecurity: No Food Insecurity  ? Worried About Programme researcher, broadcasting/film/video in the Last Year: Never true  ? Ran Out of Food in the Last Year: Never  true  ?Transportation Needs: No Transportation Needs  ? Lack of Transportation (Medical): No  ? Lack of Transportation (Non-Medical): No  ?Physical Activity: Not on file  ?Stress: Not on file  ?Social Connections: Not on file  ?Intimate Partner Violence: Not on file  ? ? ?Review of Systems  ?Constitutional:  Negative for fatigue.  ?Psychiatric/Behavioral:  Positive for sleep disturbance.   ? ?Vitals:  ? 10/04/21 1132  ?BP: 122/80  ?Pulse: 69  ?Temp: 98.1 ?F (36.7 ?C)  ?SpO2: 95%  ? ? ? ?Physical Exam ?Constitutional:   ?   Appearance: He is obese.  ?HENT:  ?   Head: Normocephalic.  ?   Mouth/Throat:  ?   Mouth: Mucous membranes are moist.  ?Cardiovascular:  ?   Rate and Rhythm: Normal rate and regular rhythm.  ?   Heart sounds: No murmur heard. ?  No friction rub.  ?Pulmonary:  ?   Effort: No respiratory distress.  ?   Breath sounds: No stridor. No wheezing or  rhonchi.  ?Musculoskeletal:  ?   Cervical back: No rigidity or tenderness.  ?Neurological:  ?   Mental Status: He is alert.  ?Psychiatric:     ?   Mood and Affect: Mood normal.  ? ? ?  10/04/2021  ? 11:00 AM  ?Results of the Epworth flowsheet  ?Sitting and reading 2  ?Watching TV 2  ?Sitting, inactive in a public place (e.g. a theatre or a meeting) 0  ?As a passenger in a car for an hour without a break 0  ?Lying down to rest in the afternoon when circumstances permit 2  ?Sitting and talking to someone 2  ?Sitting quietly after a lunch without alcohol 2  ?In a car, while stopped for a few minutes in traffic 0  ?Total score 10  ? ? ?Data Reviewed: ?No previous study on record ? ?Assessment:  ?Moderate to significant probability of significant obstructive sleep apnea ? ?Patient will have difficulty setting up and maintaining home sleep study device ? ?We will best benefit from an in lab split-night study ? ?Class II obesity ?-Weight loss efforts discussed ? ?Pathophysiology of sleep disordered breathing discussed ?Treatment options for sleep disordered breathing discussed ? ?Plan/Recommendations: ?Schedule patient for an in lab split-night study ? ?Weight loss efforts encouraged ? ?Importance of keeping weight in check discussed with the patient ? ?Tentative follow-up in ? ? ?Virl Diamond MD ?[facility] ?10/04/2021, 11:44 AM ? ?CC: Dellis Filbert, MD ? ? ?

## 2021-10-04 NOTE — Patient Instructions (Signed)
Schedule for in lab split-night study ? ?It is very important to work on weight loss and keep the weight in check ? ? ?Call with significant concerns ? ?Tentative follow-up in 4 to 5 months ? ?Living With Sleep Apnea ?Sleep apnea is a condition in which breathing pauses or becomes shallow during sleep. Sleep apnea is most commonly caused by a collapsed or blocked airway. People with sleep apnea usually snore loudly. They may have times when they gasp and stop breathing for 10 seconds or more during sleep. This may happen many times during the night. ?The breaks in breathing also interrupt the deep sleep that you need to feel rested. Even if you do not completely wake up from the gaps in breathing, your sleep may not be restful and you feel tired during the day. You may also have a headache in the morning and low energy during the day, and you may feel anxious or depressed. ?How can sleep apnea affect me? ?Sleep apnea increases your chances of extreme tiredness during the day (daytime fatigue). It can also increase your risk for health conditions, such as: ?Heart attack. ?Stroke. ?Obesity. ?Type 2 diabetes. ?Heart failure. ?Irregular heartbeat. ?High blood pressure. ?If you have daytime fatigue as a result of sleep apnea, you may be more likely to: ?Perform poorly at school or work. ?Fall asleep while driving. ?Have difficulty with attention. ?Develop depression or anxiety. ?Have sexual dysfunction. ?What actions can I take to manage sleep apnea? ?Sleep apnea treatment ? ?If you were given a device to open your airway while you sleep, use it only as told by your health care provider. You may be given: ?An oral appliance. This is a custom-made mouthpiece that shifts your lower jaw forward. ?A continuous positive airway pressure (CPAP) device. This device blows air through a mask when you breathe out (exhale). ?A nasal expiratory positive airway pressure (EPAP) device. This device has valves that you put into each  nostril. ?A bi-level positive airway pressure (BIPAP) device. This device blows air through a mask when you breathe in (inhale) and breathe out (exhale). ?You may need surgery if other treatments do not work for you. ?Sleep habits ?Go to sleep and wake up at the same time every day. This helps set your internal clock (circadian rhythm) for sleeping. ?If you stay up later than usual, such as on weekends, try to get up in the morning within 2 hours of your normal wake time. ?Try to get at least 7-9 hours of sleep each night. ?Stop using a computer, tablet, and mobile phone a few hours before bedtime. ?Do not take long naps during the day. If you nap, limit it to 30 minutes. ?Have a relaxing bedtime routine. Reading or listening to music may relax you and help you sleep. ?Use your bedroom only for sleep. ?Keep your television and computer out of your bedroom. ?Keep your bedroom cool, dark, and quiet. ?Use a supportive mattress and pillows. ?Follow your health care provider's instructions for other changes to sleep habits. ?Nutrition ?Do not eat heavy meals in the evening. ?Do not have caffeine in the later part of the day. The effects of caffeine can last for more than 5 hours. ?Follow your health care provider's or dietitian's instructions for any diet changes. ?Lifestyle ? ?  ? ?Do not drink alcohol before bedtime. Alcohol can cause you to fall asleep at first, but then it can cause you to wake up in the middle of the night and have trouble getting  back to sleep. ?Do not use any products that contain nicotine or tobacco. These products include cigarettes, chewing tobacco, and vaping devices, such as e-cigarettes. If you need help quitting, ask your health care provider. ?Medicines ?Take over-the-counter and prescription medicines only as told by your health care provider. ?Do not use over-the-counter sleep medicine. You can become dependent on this medicine, and it can make sleep apnea worse. ?Do not use medicines,  such as sedatives and narcotics, unless told by your health care provider. ?Activity ?Exercise on most days, but avoid exercising in the evening. Exercising near bedtime can interfere with sleeping. ?If possible, spend time outside every day. Natural light helps regulate your circadian rhythm. ?General information ?Lose weight if you need to, and maintain a healthy weight. ?Keep all follow-up visits. This is important. ?If you are having surgery, make sure to tell your health care provider that you have sleep apnea. You may need to bring your device with you. ?Where to find more information ?Learn more about sleep apnea and daytime fatigue from: ?American Sleep Association: sleepassociation.org ?National Sleep Foundation: sleepfoundation.org ?National Heart, Lung, and Blood Institute: https://www.hartman-hill.biz/ ?Summary ?Sleep apnea is a condition in which breathing pauses or becomes shallow during sleep. ?Sleep apnea can cause daytime fatigue and other serious health conditions. ?You may need to wear a device while sleeping to help keep your airway open. ?If you are having surgery, make sure to tell your health care provider that you have sleep apnea. You may need to bring your device with you. ?Making changes to sleep habits, diet, lifestyle, and activity can help you manage sleep apnea. ?This information is not intended to replace advice given to you by your health care provider. Make sure you discuss any questions you have with your health care provider. ?Document Revised: 01/12/2021 Document Reviewed: 05/14/2020 ?Elsevier Patient Education ? [place]. ? ?

## 2021-10-17 ENCOUNTER — Encounter: Payer: Medicaid Other | Admitting: Internal Medicine

## 2021-10-27 ENCOUNTER — Ambulatory Visit: Payer: Medicaid Other | Admitting: Internal Medicine

## 2021-10-27 VITALS — BP 120/65 | HR 67 | Temp 98.5°F | Wt 276.7 lb

## 2021-10-27 DIAGNOSIS — R7303 Prediabetes: Secondary | ICD-10-CM | POA: Diagnosis present

## 2021-10-27 DIAGNOSIS — R8290 Unspecified abnormal findings in urine: Secondary | ICD-10-CM

## 2021-10-27 DIAGNOSIS — G4700 Insomnia, unspecified: Secondary | ICD-10-CM | POA: Diagnosis not present

## 2021-10-27 DIAGNOSIS — F17210 Nicotine dependence, cigarettes, uncomplicated: Secondary | ICD-10-CM | POA: Diagnosis not present

## 2021-10-27 DIAGNOSIS — F419 Anxiety disorder, unspecified: Secondary | ICD-10-CM

## 2021-10-27 DIAGNOSIS — R0683 Snoring: Secondary | ICD-10-CM | POA: Diagnosis not present

## 2021-10-27 DIAGNOSIS — J45909 Unspecified asthma, uncomplicated: Secondary | ICD-10-CM | POA: Diagnosis not present

## 2021-10-27 DIAGNOSIS — E669 Obesity, unspecified: Secondary | ICD-10-CM

## 2021-10-27 DIAGNOSIS — R32 Unspecified urinary incontinence: Secondary | ICD-10-CM | POA: Insufficient documentation

## 2021-10-27 DIAGNOSIS — A523 Neurosyphilis, unspecified: Secondary | ICD-10-CM

## 2021-10-27 DIAGNOSIS — N3944 Nocturnal enuresis: Secondary | ICD-10-CM | POA: Diagnosis not present

## 2021-10-27 DIAGNOSIS — Z9109 Other allergy status, other than to drugs and biological substances: Secondary | ICD-10-CM | POA: Diagnosis not present

## 2021-10-27 LAB — POCT GLYCOSYLATED HEMOGLOBIN (HGB A1C): Hemoglobin A1C: 5.6 % (ref 4.0–5.6)

## 2021-10-27 LAB — GLUCOSE, CAPILLARY: Glucose-Capillary: 95 mg/dL (ref 70–99)

## 2021-10-27 MED ORDER — ALBUTEROL SULFATE HFA 108 (90 BASE) MCG/ACT IN AERS: 2.0000 | INHALATION_SPRAY | Freq: Four times a day (QID) | RESPIRATORY_TRACT | 2 refills | Status: DC | PRN

## 2021-10-27 MED ORDER — OLOPATADINE HCL 0.2 % OP SOLN: 1.0000 [drp] | Freq: Every morning | OPHTHALMIC | 0 refills | Status: DC

## 2021-10-27 MED ORDER — TRAZODONE HCL 100 MG PO TABS: ORAL_TABLET | ORAL | 0 refills | Status: DC

## 2021-10-27 MED ORDER — COMBIGAN 0.2-0.5 % OP SOLN: 1.0000 [drp] | Freq: Two times a day (BID) | OPHTHALMIC | 0 refills | Status: AC

## 2021-10-27 MED ORDER — CETIRIZINE HCL 10 MG PO TABS: ORAL_TABLET | ORAL | 1 refills | Status: DC

## 2021-10-27 MED ORDER — GABAPENTIN 800 MG PO TABS: ORAL_TABLET | ORAL | 0 refills | Status: DC

## 2021-10-27 MED ORDER — ZOLPIDEM TARTRATE 10 MG PO TABS: 10.0000 mg | ORAL_TABLET | Freq: Every evening | ORAL | 2 refills | Status: DC | PRN

## 2021-10-27 NOTE — Assessment & Plan Note (Signed)
Patient endorsed dry cough at night.  He states that intermittently he would have phlegm production that is clear in color.  He denies any fever or chills.  No sick contacts.  He has a remote history of childhood asthma.  He does not currently have an inhaler.  He denies shortness of breath or chest tightness.  Given seasonal allergy history and childhood history of asthma, beneficial for him to have albuterol inhaler as needed in case allergies trigger an asthma attack. ? ?P: ?-Albuterol inhaler as needed ?

## 2021-10-27 NOTE — Assessment & Plan Note (Addendum)
Patient has gained about 6 pounds over the last several months.  He states that he eats what ever he wants.  He is not currently on a diet regiment.  However he endorsed that he exercises daily, walks about 1 hour and half daily.  His father, who is his primary caretaker states that they prepare the meals daily.  They declined referral to dietitian at this time.  They were counseled on the importance of diet and exercise.  They acknowledges and agrees. ? ? ?P: ?-Provided pamphlet on diet/portion control ?-Encourage diet and exercise   ?

## 2021-10-27 NOTE — Progress Notes (Signed)
? ?  CC: Urinary incontinence, routine checkup, medication refill ? ?HPI: ? ?[name] is a 41 y.o. male with a past medical history stated below and presents today for CC listed above. Please see problem based assessment and plan for additional details. ? ?Past Medical History:  ?Diagnosis Date  ? Asthma   ? Bilateral lower extremity edema 02/06/2017  ? Cataract   ? NS OU  ? Glaucoma   ? POAG OU  ? Syphilis   ? Upper respiratory tract infection 07/03/2017  ? ? ?Current Outpatient Medications on File Prior to Visit  ?Medication Sig Dispense Refill  ? RESTASIS 0.05 % ophthalmic emulsion 1 drop 2 (two) times daily.    ? ?No current facility-administered medications on file prior to visit.  ? ? ?Family History  ?Problem Relation Age of Onset  ? Hypertension Father   ? Hypertension Mother   ? Arthritis Mother   ? ? ?Social History  ? ?Socioeconomic History  ? Marital status: Single  ?  Spouse name: Not on file  ? Number of children: Not on file  ? Years of education: Not on file  ? Highest education level: Not on file  ?Occupational History  ? Not on file  ?Tobacco Use  ? Smoking status: Some Days  ?  Packs/day: 0.50  ?  Types: Cigarettes  ? Smokeless tobacco: Never  ? Tobacco comments:  ?  .5 pks per day   ?Vaping Use  ? Vaping Use: Never used  ?Substance and Sexual Activity  ? Alcohol use: No  ? Drug use: No  ? Sexual activity: Yes  ?Other Topics Concern  ? Not on file  ?Social History Narrative  ? Not on file  ? ?Social Determinants of Health  ? ?Financial Resource Strain: Low Risk   ? Difficulty of Paying Living Expenses: Not hard at all  ?Food Insecurity: No Food Insecurity  ? Worried About Programme researcher, broadcasting/film/video in the Last Year: Never true  ? Ran Out of Food in the Last Year: Never true  ?Transportation Needs: No Transportation Needs  ? Lack of Transportation (Medical): No  ? Lack of Transportation (Non-Medical): No  ?Physical Activity: Not on file  ?Stress: Not on file  ?Social Connections: Not on file   ?Intimate Partner Violence: Not on file  ? ? ?Review of Systems: ?ROS negative except for what is noted on the assessment and plan. ? ?Vitals:  ? 10/27/21 1348  ?BP: 120/65  ?Pulse: 67  ?Temp: 98.5 ?F (36.9 ?C)  ?TempSrc: Oral  ?SpO2: 99%  ?Weight: 276 lb 11.2 oz (125.5 kg)  ? ? ? ?Physical Exam: ?Constitutional: normal-appearing obese middle-age gentleman sitting in the chair, present with his father, in no acute distress ?HENT: normocephalic atraumatic, mucous membranes moist ?Eyes: conjunctiva non-erythematous ?Neck: supple ?Cardiovascular: regular rate and rhythm, no m/r/g ?Pulmonary/Chest: normal work of breathing on room air, lungs clear to auscultation bilaterally ?Abdominal: soft, non-tender, non-distended ?MSK: normal bulk and tone ?Neurological: alert & oriented x 3, 5/5 strength in bilateral upper and lower extremities, normal gait ?Skin: warm and dry ?Psych: Answers questions appropriately ? ? ?Assessment & Plan:  ? ?See Encounters Tab for problem based charting. ? ?Patient discussed with Dr. Oswaldo Done ?Dellis Filbert, M.D. ?[facility] Internal Medicine, PGY-1 ?Pager: [phone], Phone: [phone] ?Date 10/27/2021 Time 3:36 PM ? ?

## 2021-10-27 NOTE — Assessment & Plan Note (Addendum)
For the past year patient has been experiencing nocturnal enuresis.  He denies any known trigger or source.  His father noted foul-smelling urine in the mornings and realized patient was wetting the bed at night.  Patient drinks water throughout the day and evenings into the night.  There was an attempt to stop drinking water after 7 PM, patient was not adherent to this regimen.  He states that sometimes he would get up in the middle the night to go to the bathroom and other times he would sleep through the night.  For the past month he experiences nightmares 3 times a week.  There was a recent death in the family/friend/torch member but he states he is unaffected and denies grieving.  There is no clear source as to why patient developed nocturnal enuresis.  The nightmares and recent death in the family was after symptom onset.  Patient has a history of neurosyphilis, it is possible that progression of the disease could result in urinary incontinence.  Patient will benefit from adult diapers.  This will be helpful to avoid continual swelling of clothing and sheets.  Prevent nidus for infection. ? ?P: ?-DME adult diapers ? ?

## 2021-10-27 NOTE — Patient Instructions (Signed)
Thank you, [name] for allowing [place] to provide your care today.    ? ?I have ordered the following labs for you: ? ?Lab Orders    ?     POC Hbg A1C     ? ?Tests ordered today: ? ?None ? ?Referrals ordered today:  ? ?Referral Orders  ?No referral(s) requested today  ?  ? ?I have ordered the following medication/changed the following medications:  ? ?Stop the following medications: ?Medications Discontinued During This Encounter  ?Medication Reason  ? zolpidem (AMBIEN) 10 MG tablet Reorder  ? gabapentin (NEURONTIN) 800 MG tablet Reorder  ? Olopatadine HCl 0.2 % SOLN Reorder  ? COMBIGAN 0.2-0.5 % ophthalmic solution Reorder  ? traZODone (DESYREL) 100 MG tablet Reorder  ? cetirizine (ZYRTEC) 10 MG tablet Reorder  ?  ? ?Start the following medications: ?Meds ordered this encounter  ?Medications  ? albuterol (VENTOLIN HFA) 108 (90 Base) MCG/ACT inhaler  ?  Sig: Inhale 2 puffs into the lungs every 6 (six) hours as needed for wheezing or shortness of breath.  ?  Dispense:  8 g  ?  Refill:  2  ? cetirizine (ZYRTEC) 10 MG tablet  ?  Sig: Seasonal allergies  ?  Dispense:  90 tablet  ?  Refill:  1  ? COMBIGAN 0.2-0.5 % ophthalmic solution  ?  Sig: Apply 1 drop to eye 2 (two) times daily.  ?  Dispense:  9 mL  ?  Refill:  0  ? gabapentin (NEURONTIN) 800 MG tablet  ?  Sig: TAKE 1 TABLET TWICE A DAY FOR ANXIETY AND IRRITABLITY  ?  Dispense:  180 tablet  ?  Refill:  0  ? Olopatadine HCl 0.2 % SOLN  ?  Sig: Apply 1 drop to eye every morning.  ?  Dispense:  2.5 mL  ?  Refill:  0  ? traZODone (DESYREL) 100 MG tablet  ?  Sig: GIVE 1 TABLET BY MOUTH AT BEDTIME FOR SLEEP  ?  Dispense:  90 tablet  ?  Refill:  0  ? zolpidem (AMBIEN) 10 MG tablet  ?  Sig: Take 1 tablet (10 mg total) by mouth at bedtime as needed.  ?  Dispense:  30 tablet  ?  Refill:  2  ?  ? ?Follow up: 6 months  ? ?Remember: Please continue taking medications as directed.  Please take your Zyrtec twice a day for the next 7 days to improve your allergies.  Then you  can continue taking it once a day.  I have refilled your albuterol inhaler, use as needed if you become short of breath. ? ?Should you have any questions or concerns please call the internal medicine clinic at [phone].   ? ?Eric Filbert, MD ?[facility] ? ? ?

## 2021-10-27 NOTE — Assessment & Plan Note (Addendum)
Patient father did endorse that patient snores throughout the night.  Patient states that he wakes up in the morning well rested.  Denies any headaches in the morning. Patient is scheduled for sleep study 11/11/2021. ? ? ?P: ?-Follow-up sleep study results ?

## 2021-10-27 NOTE — Assessment & Plan Note (Addendum)
Patient was diagnosed and treated for syphilis in 2000.  In 2017 he started exhibiting psychiatric symptoms that resulted in hospitalization in 2017.  At that time he was diagnosed and retreated for neurosyphilis with IV penicillin for 10 days.  Most of his psychiatric concerns have been resolved however he has residual tremors, dyskinesia and altered thought process.  An LP was obtained in 2018 which showed reduced RPR titer posttreatment.  No further LPs were indicated.  Most recent RPR was obtained in May 2019 which showed decrease in titer.  No further treatment was indicated at that time.  Patient's father is primary caregiver at this time. Father endorse no increased worsening of memory changes or cognition. Perform ADLs independently and IADLs dependently. Father is interested in son going to a facility. ? ?P: ?-Patient is on disability, he is on Medicaid and currently has a case Freight forwarder.  Advised to defer recommendation for facility to case management. ?

## 2021-10-27 NOTE — Assessment & Plan Note (Signed)
Last A1c was obtained 1 year ago, patient was in the prediabetes range.  Since then patient has not made much adjustments in his diet.  Although, he states he exercises daily.  Patient was counseled on the importance of diet and exercise to avoid development of diabetes.  He acknowledges and agrees. ? ? ?P: ?-A1c obtained today ?-If A1c greater than 6.5% consider initiating antihyperglycemic agent ?

## 2021-10-28 NOTE — Progress Notes (Signed)
Internal Medicine Clinic Attending ? ?Case discussed with Dr. Ariwodo  At the time of the visit.  We reviewed the resident?s history and exam and pertinent patient test results.  I agree with the assessment, diagnosis, and plan of care documented in the resident?s note.  ?

## 2021-11-11 ENCOUNTER — Ambulatory Visit (HOSPITAL_BASED_OUTPATIENT_CLINIC_OR_DEPARTMENT_OTHER): Payer: Medicaid Other | Attending: Pulmonary Disease | Admitting: Pulmonary Disease

## 2021-11-28 ENCOUNTER — Ambulatory Visit (INDEPENDENT_AMBULATORY_CARE_PROVIDER_SITE_OTHER): Payer: Medicaid Other | Admitting: Podiatry

## 2021-11-28 DIAGNOSIS — Z91199 Patient's noncompliance with other medical treatment and regimen due to unspecified reason: Secondary | ICD-10-CM

## 2021-12-03 NOTE — Progress Notes (Signed)
   Complete physical exam  Patient: Eric Stephens   DOB: 04/08/2000   41 y.o. Male  MRN: [id number]  Subjective:    No chief complaint on file.   Eric Stephens is a 41 y.o. male who presents today for a complete physical exam. She reports consuming a {diet types:17450} diet. {types:19826} She generally feels {DESC; WELL/FAIRLY WELL/POORLY:18703}. She reports sleeping {DESC; WELL/FAIRLY WELL/POORLY:18703}. She {does/does not:200015} have additional problems to discuss today.    Most recent fall risk assessment:    12/14/2021   10:42 AM  Fall Risk   Falls in the past year? 0  Number falls in past yr: 0  Injury with Fall? 0  Risk for fall due to : No Fall Risks  Follow up Falls evaluation completed     Most recent depression screenings:    12/14/2021   10:42 AM 11/04/2020   10:46 AM  PHQ 2/9 Scores  PHQ - 2 Score 0 0  PHQ- 9 Score 5     {VISON DENTAL STD PSA (Optional):27386}  {History (Optional):23778}  Patient Care Team: Jessup, Joy, NP as PCP - General (Nurse Practitioner)   Outpatient Medications Prior to Visit  Medication Sig   fluticasone (FLONASE) 50 MCG/ACT nasal spray Place 2 sprays into both nostrils in the morning and at bedtime. After 7 days, reduce to once daily.   norgestimate-ethinyl estradiol (SPRINTEC 28) 0.25-35 MG-MCG tablet Take 1 tablet by mouth daily.   Nystatin POWD Apply liberally to affected area 2 times per day   spironolactone (ALDACTONE) 100 MG tablet Take 1 tablet (100 mg total) by mouth daily.   No facility-administered medications prior to visit.    ROS        Objective:     There were no vitals taken for this visit. {Vitals History (Optional):23777}  Physical Exam   No results found for any visits on 01/19/22. {Show previous labs (optional):23779}    Assessment & Plan:    Routine Health Maintenance and Physical Exam  Immunization History  Administered Date(s) Administered   DTaP 06/22/1999, 08/18/1999,  10/27/1999, 07/12/2000, 01/26/2004   Hepatitis A 11/22/2007, 11/27/2008   Hepatitis B 04/09/1999, 05/17/1999, 10/27/1999   HiB (PRP-OMP) 06/22/1999, 08/18/1999, 10/27/1999, 07/12/2000   IPV 06/22/1999, 08/18/1999, 04/16/2000, 01/26/2004   Influenza,inj,Quad PF,6+ Mos 02/27/2014   Influenza-Unspecified 05/29/2012   MMR 04/16/2001, 01/26/2004   Meningococcal Polysaccharide 11/27/2011   Pneumococcal Conjugate-13 07/12/2000   Pneumococcal-Unspecified 10/27/1999, 01/10/2000   Tdap 11/27/2011   Varicella 04/16/2000, 11/22/2007    Health Maintenance  Topic Date Due   HIV Screening  Never done   Hepatitis C Screening  Never done   INFLUENZA VACCINE  01/17/2022   PAP-Cervical Cytology Screening  01/19/2022 (Originally 04/07/2020)   PAP SMEAR-Modifier  01/19/2022 (Originally 04/07/2020)   TETANUS/TDAP  01/19/2022 (Originally 11/26/2021)   HPV VACCINES  Discontinued   COVID-19 Vaccine  Discontinued    Discussed health benefits of physical activity, and encouraged her to engage in regular exercise appropriate for her age and condition.  Problem List Items Addressed This Visit   None Visit Diagnoses     Annual physical exam    -  Primary   Cervical cancer screening       Need for Tdap vaccination          No follow-ups on file.     Joy Jessup, NP   

## 2021-12-07 ENCOUNTER — Other Ambulatory Visit: Payer: Self-pay | Admitting: Internal Medicine

## 2021-12-07 DIAGNOSIS — A523 Neurosyphilis, unspecified: Secondary | ICD-10-CM

## 2022-01-17 ENCOUNTER — Telehealth: Payer: Self-pay | Admitting: *Deleted

## 2022-01-17 NOTE — Chronic Care Management (AMB) (Signed)
  Care Coordination   Note   01/17/2022 Name: LOUISE VICTORY MRN: [id number] DOB: May 19, 1981  KEONTAY VORA is a 41 y.o. year old male who sees Gwenevere Abbot, MD for primary care. I reached out to Barkley Bruns by phone today to offer care coordination services.  Mr. Mclure was given information about Care Coordination services today including:   The Care Coordination services include support from the care team which includes your Nurse Coordinator, Clinical Social Worker, or Pharmacist.  The Care Coordination team is here to help remove barriers to the health concerns and goals most important to you. Care Coordination services are voluntary, and the patient may decline or stop services at any time by request to their care team member.   Care Coordination Consent Status: Patient father Legal guardian Thomson Herbers agreed to services and verbal consent obtained.   Follow up plan:  Telephone appointment with care coordination team member scheduled for:  01/19/22  Encounter Outcome:  Pt. Scheduled  [facility] Coordination Care Guide  Direct Dial: [phone]

## 2022-01-19 ENCOUNTER — Ambulatory Visit: Payer: Self-pay | Admitting: Licensed Clinical Social Worker

## 2022-01-19 ENCOUNTER — Encounter: Payer: Self-pay | Admitting: Licensed Clinical Social Worker

## 2022-01-19 NOTE — Patient Outreach (Signed)
  Care Coordination   Follow Up Visit Note   01/19/2022 Name: LAMARR FEENSTRA MRN: [id number] DOB: 06-03-1981  LINAS STEPTER is a 41 y.o. year old male who sees Gwenevere Abbot, MD for primary care. I spoke with  Barkley Bruns by phone today  What matters to the patients health and wellness today?  Patient continue search for assistant living SNF placement.  SW contacted a few on today, but was unsuccessful. SW emailed patients father a link including [place] facilities. SW advised patients father to contact and tour facilities. Patient father to update when placement is found and then SW will requested an updated Fl2.     SDOH assessments and interventions completed:  Yes     Care Coordination Interventions Activated:  Yes  Care Coordination Interventions:  Yes, provided   Follow up plan: Follow up call scheduled for within the next 30 days.    Encounter Outcome:  Pt. Visit Completed   Ander Gaster , MSW Social Worker [facility] Care Management  [phone]

## 2022-01-19 NOTE — Patient Instructions (Signed)
Visit Information  Instructions: patient will work with SW to address concerns related to Placement  Patient was given the following information about care management and care coordination services today, agreed to services, and gave verbal consent: 1.care management/care coordination services include personalized support from designated clinical staff supervised by their physician, including individualized plan of care and coordination with other care providers 2. 24/7 contact phone numbers for assistance for urgent and routine care needs. 3. The patient may stop care management/care coordination services at any time by phone call to the office staff.  Patient verbalizes understanding of instructions and care plan provided today and agrees to view in MyChart. Active MyChart status and patient understanding of how to access instructions and care plan via MyChart confirmed with patient.     The care management team will reach out to the patient again over the next 30 days.   Nalee Lightle, BSW, MSW  Social Worker [facility] Care Management  [phone]      

## 2022-01-24 ENCOUNTER — Ambulatory Visit: Payer: Self-pay | Admitting: Licensed Clinical Social Worker

## 2022-01-24 NOTE — Patient Outreach (Signed)
  Care Coordination   Follow Up Visit Note   01/24/2022 Name: GEORGIA BARIA MRN: [id number] DOB: 02/23/1981  KEENAN TREFRY is a 41 y.o. year old male who sees Gwenevere Abbot, MD for primary care. I spoke with  Barkley Bruns by phone today  What matters to the patients health and wellness today?  Collaborated with care team and family on today. Patient is requesting information for group homes. SW gathered information and emailed to patients father as requested.      SDOH assessments and interventions completed:  Yes     Care Coordination Interventions Activated:  Yes  Care Coordination Interventions:  Yes, provided   Follow up plan: Follow up call scheduled for within the next 30 days.    Encounter Outcome:  Pt. Visit Completed   Ander Gaster , MSW Social Worker [facility] Care Management  [phone]

## 2022-01-27 ENCOUNTER — Ambulatory Visit: Payer: Self-pay | Admitting: Licensed Clinical Social Worker

## 2022-01-27 NOTE — Patient Outreach (Signed)
  Care Coordination   Follow Up Visit Note   01/27/2022 Name: KAENAN JAKE MRN: [id number] DOB: October 12, 1980  MAKHI MUZQUIZ is a 41 y.o. year old male who sees Gwenevere Abbot, MD for primary care. SW contacted multiple agencies on today.  What matters to the patients health and wellness today?  Group Home Placement   Goals Addressed             This Visit's Progress    Care Coordination       Patient care takers are requesting placement for patient.  SW contacted several assistance living facilities. Facility's denied patient  and recommended he be placed in a group home.  Patient care takers requested information on group homes to contact.  SW provided patient caretakers with "[employer]". SW attempted contact with [facility] and left a message with referral.         SDOH assessments and interventions completed:  Yes     Care Coordination Interventions Activated:  Yes  Care Coordination Interventions:  Yes, provided   Follow up plan: Follow up call scheduled for within the next 30 days.    Encounter Outcome:  Pt. Visit Completed  Ander Gaster, MSW  Social Worker [facility]  [phone]

## 2022-01-27 NOTE — Patient Instructions (Signed)
Visit Information  Instructions: patient will work with SW to address concerns related to placement  Patient was given the following information about care management and care coordination services today, agreed to services, and gave verbal consent: 1.care management/care coordination services include personalized support from designated clinical staff supervised by their physician, including individualized plan of care and coordination with other care providers 2. 24/7 contact phone numbers for assistance for urgent and routine care needs. 3. The patient may stop care management/care coordination services at any time by phone call to the office staff.  Patient verbalizes understanding of instructions and care plan provided today and agrees to view in MyChart. Active MyChart status and patient understanding of how to access instructions and care plan via MyChart confirmed with patient.     The care management team will reach out to the patient again over the next 30 days.   Ander Gaster , MSW Social Worker [facility]  [phone]

## 2022-02-02 ENCOUNTER — Ambulatory Visit: Payer: Self-pay | Admitting: Licensed Clinical Social Worker

## 2022-02-02 NOTE — Patient Instructions (Signed)
Visit Information  Instructions: patient will work with SW to address concerns related to placement.  Patient was given the following information about care management and care coordination services today, agreed to services, and gave verbal consent: 1.care management/care coordination services include personalized support from designated clinical staff supervised by their physician, including individualized plan of care and coordination with other care providers 2. 24/7 contact phone numbers for assistance for urgent and routine care needs. 3. The patient may stop care management/care coordination services at any time by phone call to the office staff.  Patient verbalizes understanding of instructions and care plan provided today and agrees to view in MyChart. Active MyChart status and patient understanding of how to access instructions and care plan via MyChart confirmed with patient.     The care management team will reach out to the patient again over the next 60 days.   Kordel Leavy, BSW, MSW  Social Worker [facility]  [phone]      

## 2022-02-02 NOTE — Patient Outreach (Signed)
  Care Coordination   Follow Up Visit Note   02/02/2022 Name: Eric Stephens MRN: [id number] DOB: July 27, 1980  Eric Stephens is a 41 y.o. year old male who sees Gwenevere Abbot, MD for primary care. I spoke with  Eric Stephens by phone today  What matters to the patients health and wellness today?  Placement     Goals Addressed               This Visit's Progress     MSW Care Coordination (pt-stated)        SW and patient are continuously working together to find placement. Patient will contact Medicaid SW at DSS to request assistance with placement.         SDOH assessments and interventions completed:  Yes     Care Coordination Interventions Activated:  Yes  Care Coordination Interventions:  Yes, provided   Follow up plan: Follow up call scheduled for within the next 60 days.    Encounter Outcome:  Pt. Visit Completed   Ander Gaster , MSW Social Worker [facility]  [phone]

## 2022-02-07 ENCOUNTER — Encounter: Payer: Self-pay | Admitting: Pulmonary Disease

## 2022-02-07 ENCOUNTER — Ambulatory Visit (INDEPENDENT_AMBULATORY_CARE_PROVIDER_SITE_OTHER): Payer: Medicaid Other | Admitting: Pulmonary Disease

## 2022-02-07 VITALS — BP 118/76 | HR 65 | Temp 97.8°F | Ht 71.0 in | Wt 262.0 lb

## 2022-02-07 DIAGNOSIS — G4719 Other hypersomnia: Secondary | ICD-10-CM | POA: Diagnosis not present

## 2022-02-07 NOTE — Progress Notes (Signed)
Eric Stephens    [id number]    November 21, 1980  Primary Care Physician:Khan, Carylon Perches, MD  Referring Physician: Dellis Filbert, MD No address on file  Chief complaint:   Patient being seen for concern for sleep apnea  HPI:  Has not had sleep study performed He was told that he needed somebody to stay with him during the study  History of snoring, witnessed apneas, choking episodes at night  Longstanding problem  Difficulty sleeping led to him being on Ambien started about 3 4 years ago -Ambien does help  Concerned about his breathing -States he is able to walk up to 3 to 5 miles if he chooses to -Has no wheezes, no shortness of breath at rest  Usually tries to go to bed between 830 and 9 Wake up time about 6:30 AM May have 1 or 2 awakenings  No family history of sleep apnea  No history of lung disease, no history of heart disease  History of schizophrenia, history of insomnia  Outpatient Encounter Medications as of 02/07/2022  Medication Sig   albuterol (VENTOLIN HFA) 108 (90 Base) MCG/ACT inhaler Inhale 2 puffs into the lungs every 6 (six) hours as needed for wheezing or shortness of breath.   cetirizine (ZYRTEC) 10 MG tablet Seasonal allergies   gabapentin (NEURONTIN) 800 MG tablet TAKE 1 TABLET TWICE A DAY FOR ANXIETY AND IRRITABLITY   Olopatadine HCl 0.2 % SOLN APPLY 1 DROP TO EYE EVERY MORNING.   RESTASIS 0.05 % ophthalmic emulsion 1 drop 2 (two) times daily.   traZODone (DESYREL) 100 MG tablet GIVE 1 TABLET BY MOUTH AT BEDTIME FOR SLEEP   zolpidem (AMBIEN) 10 MG tablet Take 1 tablet (10 mg total) by mouth at bedtime as needed.   No facility-administered encounter medications on file as of 02/07/2022.    Allergies as of 02/07/2022   (No Known Allergies)    Past Medical History:  Diagnosis Date   Asthma    Bilateral lower extremity edema 02/06/2017   Cataract    NS OU   Glaucoma    POAG OU   Syphilis    Upper respiratory tract infection  07/03/2017    Past Surgical History:  Procedure Laterality Date   WRIST SURGERY      Family History  Problem Relation Age of Onset   Hypertension Father    Hypertension Mother    Arthritis Mother     Social History   Socioeconomic History   Marital status: Single    Spouse name: Not on file   Number of children: Not on file   Years of education: Not on file   Highest education level: Not on file  Occupational History   Not on file  Tobacco Use   Smoking status: Some Days    Packs/day: 0.50    Types: Cigarettes   Smokeless tobacco: Never   Tobacco comments:    .5 pks  some days. Hsm.   Vaping Use   Vaping Use: Never used  Substance and Sexual Activity   Alcohol use: No   Drug use: No   Sexual activity: Yes  Other Topics Concern   Not on file  Social History Narrative   Not on file   Social Determinants of Health   Financial Resource Strain: Low Risk  (11/30/2020)   Overall Financial Resource Strain (CARDIA)    Difficulty of Paying Living Expenses: Not hard at all  Food Insecurity: No Food Insecurity (01/19/2022)  Hunger Vital Sign    Worried About Running Out of Food in the Last Year: Never true    Ran Out of Food in the Last Year: Never true  Transportation Needs: No Transportation Needs (01/19/2022)   PRAPARE - Administrator, Civil Service (Medical): No    Lack of Transportation (Non-Medical): No  Physical Activity: Not on file  Stress: Not on file  Social Connections: Not on file  Intimate Partner Violence: Not on file    Review of Systems  Constitutional:  Negative for fatigue.  Psychiatric/Behavioral:  Positive for sleep disturbance.    Vitals:   02/07/22 1119  BP: 118/76  Pulse: 65  Temp: 97.8 F (36.6 C)     Physical Exam Constitutional:      Appearance: He is obese.  HENT:     Head: Normocephalic.     Mouth/Throat:     Mouth: Mucous membranes are moist.  Cardiovascular:     Rate and Rhythm: Normal rate and regular  rhythm.     Heart sounds: No murmur heard.    No friction rub.  Pulmonary:     Effort: No respiratory distress.     Breath sounds: No stridor. No wheezing or rhonchi.  Musculoskeletal:     Cervical back: No rigidity or tenderness.  Neurological:     Mental Status: He is alert.  Psychiatric:        Mood and Affect: Mood normal.       10/04/2021   11:00 AM  Results of the Epworth flowsheet  Sitting and reading 2  Watching TV 2  Sitting, inactive in a public place (e.g. a theatre or a meeting) 0  As a passenger in a car for an hour without a break 0  Lying down to rest in the afternoon when circumstances permit 2  Sitting and talking to someone 2  Sitting quietly after a lunch without alcohol 2  In a car, while stopped for a few minutes in traffic 0  Total score 10   Data Reviewed: No previous study on record  Assessment:  Moderate to significant probability of significant obstructive sleep apnea  We will benefit from an in lab split-night study  According to his dad, he has a speech impediment and otherwise able to have a sleep study done without being accompanied  Class II obesity -Weight loss efforts discussed  Pathophysiology of sleep disordered breathing discussed Treatment options for sleep disordered breathing discussed  Plan/Recommendations: Reengage the sleep lab regarding an in lab study  Encouraged to continue activity as tolerated  Tentative follow-up in 3 months    Virl Diamond MD [facility] 02/07/2022, 11:22 AM  CC: Dellis Filbert, MD

## 2022-02-07 NOTE — Patient Instructions (Signed)
Contacts sleep lab to try and schedule use in lab study  If unable to do an in lab study, we can consider a home sleep study   We will see you in about 3 to 6 months

## 2022-02-10 ENCOUNTER — Ambulatory Visit: Payer: Self-pay | Admitting: Licensed Clinical Social Worker

## 2022-02-10 NOTE — Patient Outreach (Signed)
  Care Coordination   Follow Up Visit Note   02/10/2022 Name: DREZDEN SEITZINGER MRN: [id number] DOB: 1981-04-09  KAVONTE BEARSE is a 41 y.o. year old male who sees Gwenevere Abbot, MD for primary care. I spoke with  Barkley Bruns by phone today.  What matters to the patients health and wellness today?  Placement  SW contacted several agencies to inquire on placement. SW completed research regarding placemen and patients diagnoses. SW prepared case for case discussion.Patient has been followed by MSW and RN Care Coordinator through [facility] practice.  MSW is working with [facility] DSS to secure placement. Placement search has been ongoing.  Possible Ideas given Adult Family care or [facility], Work with Child psychotherapist at [employer] SW will Email Edwin Dada with [employer].   SDOH assessments and interventions completed:  Yes     Care Coordination Interventions Activated:  Yes  Care Coordination Interventions:  Yes, provided   Follow up plan: Follow up call scheduled for 09/07    Encounter Outcome:  Pt. Visit Completed   Ander Gaster, MSW  Social Worker [facility]  [phone]

## 2022-02-16 ENCOUNTER — Ambulatory Visit: Payer: Self-pay

## 2022-02-16 NOTE — Patient Outreach (Signed)
  Care Coordination   Multidisciplinary Case Review  Visit Note   02/16/2022 Name: Eric Stephens MRN: [id number] DOB: Nov 05, 1980  Eric Stephens is a 41 y.o. year old male who sees Gwenevere Abbot, MD for primary care. I  collaborated with multi-disciplinary team to review patient care needs and barriers.  What matters to the patients health and wellness today?  Discussed patient with [facility] nursing staff and they noted that patient does not need assistance when at clinic for visits.  He also answers questions appropriately.  May be a candidate for group home.  MSW to follow up next week.    Goals Addressed   None     SDOH assessments and interventions completed:  No     Care Coordination Interventions Activated:  Yes  Care Coordination Interventions:  Yes, provided   Follow up plan:  Follow up next week on placement ideas.    Encounter Outcome:  Pt. Visit Completed

## 2022-02-23 ENCOUNTER — Ambulatory Visit: Payer: Self-pay | Admitting: Licensed Clinical Social Worker

## 2022-02-23 NOTE — Patient Instructions (Signed)
Visit Information  Instructions: patient will work with SW to address concerns related to placement.  Patient was given the following information about care management and care coordination services today, agreed to services, and gave verbal consent: 1.care management/care coordination services include personalized support from designated clinical staff supervised by their physician, including individualized plan of care and coordination with other care providers 2. 24/7 contact phone numbers for assistance for urgent and routine care needs. 3. The patient may stop care management/care coordination services at any time by phone call to the office staff.  Patient verbalizes understanding of instructions and care plan provided today and agrees to view in MyChart. Active MyChart status and patient understanding of how to access instructions and care plan via MyChart confirmed with patient.     The care management team will reach out to the patient again over the next 60 days.   Ander Gaster, MSW  Social Worker [facility]  [phone]

## 2022-02-23 NOTE — Patient Outreach (Signed)
  Care Coordination   Follow Up Visit Note   02/23/2022 Name: Eric Stephens MRN: [id number] DOB: 1981/01/09  Eric Stephens is a 41 y.o. year old male who sees Gwenevere Abbot, MD for primary care. I spoke with  Barkley Bruns by phone today.  What matters to the patients health and wellness today?  Placement.    Goals Addressed               This Visit's Progress     MSW Care Coordination (pt-stated)        SW and patient are continuously working together to find placement. Patient will contact Medicaid SW at DSS to request assistance with placement.   SW emailed C. Claudette Laws as recommended by SW team to inquire on recommendations for placement.   SW followed up with caretakers regarding DSS recommendations.        SDOH assessments and interventions completed:  Yes     Care Coordination Interventions Activated:  Yes  Care Coordination Interventions:  Yes, provided   Follow up plan: Follow up call scheduled for within the next 60 days.    Encounter Outcome:  Pt. Visit Completed   Ander Gaster, MSW  Social Worker [facility]  [phone]

## 2022-02-24 ENCOUNTER — Telehealth: Payer: Self-pay | Admitting: *Deleted

## 2022-02-24 NOTE — Patient Outreach (Signed)
  Care Coordination   Multidisciplinary Case Review Note    02/24/2022 Name: KABIR BRANNOCK MRN: [id number] DOB: 1980/12/08  LANDRY KAMATH is a 41 y.o. year old male who sees Idamae Schuller, MD for primary care.  The  multidisciplinary care team met today to review patient care needs and barriers.     Goals Addressed   CSW working with Carmin Richmond to seek bed at [facility]- they have facilities in [place]. Her number is [phone]. SW is awaiting response from agency.      SDOH assessments and interventions completed:  No     Care Coordination Interventions Activated:  No   Care Coordination Interventions:  No, not indicated   Follow up plan:  CSW to follow up on possible placement options.    Multidisciplinary Team Attendees:   Milus Height, MSW, Johnney Killian, RNCM, Eduard Clos, LCSW, [place], RNCM, Jon Billings, [place]  Scribe for Multidisciplinary Case Review:  Milus Height and Eduard Clos

## 2022-03-02 ENCOUNTER — Telehealth: Payer: Self-pay

## 2022-03-02 NOTE — Patient Outreach (Signed)
  Care Coordination   Multidisciplinary Case Review Note    03/02/2022 Name: LUCUS LAMBERTSON MRN: [id number] DOB: 08-14-1980  DEVANTAE BABE is a 41 y.o. year old male who sees Idamae Schuller, MD for primary care.  The  multidisciplinary care team met today to review patient care needs and barriers.   Goals Addressed: Biance may have found placement for the paitent and she is waiting for a responce.  She has talked with the parents to see if they can have a possible visit.     SDOH assessments and interventions completed:  Yes     Care Coordination Interventions Activated:  Yes   Care Coordination Interventions:  Yes, provided   Follow up plan: No further intervention required.   Multidisciplinary Team Attendees:  Milus Height, MSW, Johnney Killian, RNCM, Eduard Clos, LCSW, [place], RNCM, Jon Billings, [place]  Scribe for Multidisciplinary Case Review:  Lazaro Arms, RNCM  [place], RN, MSN [facility] Care Management Coordinator Direct Line [phone]

## 2022-03-06 ENCOUNTER — Ambulatory Visit (INDEPENDENT_AMBULATORY_CARE_PROVIDER_SITE_OTHER): Payer: Medicaid Other | Admitting: Internal Medicine

## 2022-03-06 ENCOUNTER — Encounter: Payer: Self-pay | Admitting: Internal Medicine

## 2022-03-06 ENCOUNTER — Other Ambulatory Visit: Payer: Self-pay

## 2022-03-06 VITALS — BP 105/71 | HR 62 | Temp 98.2°F | Ht 71.0 in | Wt 260.2 lb

## 2022-03-06 DIAGNOSIS — E785 Hyperlipidemia, unspecified: Secondary | ICD-10-CM

## 2022-03-06 DIAGNOSIS — E0590 Thyrotoxicosis, unspecified without thyrotoxic crisis or storm: Secondary | ICD-10-CM

## 2022-03-06 DIAGNOSIS — E669 Obesity, unspecified: Secondary | ICD-10-CM

## 2022-03-06 DIAGNOSIS — A523 Neurosyphilis, unspecified: Secondary | ICD-10-CM

## 2022-03-06 DIAGNOSIS — N3944 Nocturnal enuresis: Secondary | ICD-10-CM

## 2022-03-06 DIAGNOSIS — R0683 Snoring: Secondary | ICD-10-CM

## 2022-03-06 DIAGNOSIS — F02818 Dementia in other diseases classified elsewhere, unspecified severity, with other behavioral disturbance: Secondary | ICD-10-CM

## 2022-03-06 DIAGNOSIS — F419 Anxiety disorder, unspecified: Secondary | ICD-10-CM

## 2022-03-06 DIAGNOSIS — Z9109 Other allergy status, other than to drugs and biological substances: Secondary | ICD-10-CM | POA: Diagnosis not present

## 2022-03-06 DIAGNOSIS — R0609 Other forms of dyspnea: Secondary | ICD-10-CM | POA: Diagnosis not present

## 2022-03-06 DIAGNOSIS — G4700 Insomnia, unspecified: Secondary | ICD-10-CM | POA: Diagnosis not present

## 2022-03-06 DIAGNOSIS — R7303 Prediabetes: Secondary | ICD-10-CM | POA: Diagnosis not present

## 2022-03-06 LAB — POCT GLYCOSYLATED HEMOGLOBIN (HGB A1C): Hemoglobin A1C: 5.8 % — AB (ref 4.0–5.6)

## 2022-03-06 LAB — GLUCOSE, CAPILLARY: Glucose-Capillary: 70 mg/dL (ref 70–99)

## 2022-03-06 MED ORDER — CETIRIZINE HCL 10 MG PO TABS: ORAL_TABLET | ORAL | 1 refills | Status: DC

## 2022-03-06 MED ORDER — GABAPENTIN 800 MG PO TABS: ORAL_TABLET | ORAL | 0 refills | Status: DC

## 2022-03-06 NOTE — Assessment & Plan Note (Signed)
Patient has prediabetes.  A1c has been greater than 6 previously but last one done was 5.6.  A1c today is 5.8.  Advised patient to eat healthier and exercise at least 150 minutes/week.

## 2022-03-06 NOTE — Assessment & Plan Note (Signed)
Pt continues to have nights with urinary incontinence. This is not a problem for him during the day but an issue at night time. Pt has continued need for adult diapers.

## 2022-03-06 NOTE — Progress Notes (Unsigned)
CC: follow up  HPI:  [name] is a 41 y.o. with medical history of HLD, hx of syphilis c/b neurosyphilis prediabetes, asthma and seasonal allergies presenting to [facility] for a follow up.   Please see problem-based list for further details, assessments, and plans.  Past Medical History:  Diagnosis Date   Anxiety 01/11/2017   Asthma    Bilateral lower extremity edema 02/06/2017   Cataract    NS OU   Catatonia 07/04/2016   Glaucoma    POAG OU   Onychomycosis of toenail 11/01/2017   Sore throat 06/23/2021   Syphilis    Upper respiratory tract infection 07/03/2017      Current Outpatient Medications (Respiratory):    albuterol (VENTOLIN HFA) 108 (90 Base) MCG/ACT inhaler, Inhale 2 puffs into the lungs every 6 (six) hours as needed for wheezing or shortness of breath.   cetirizine (ZYRTEC) 10 MG tablet, Seasonal allergies    Current Outpatient Medications (Other):    gabapentin (NEURONTIN) 800 MG tablet, TAKE 1 TABLET TWICE A DAY FOR ANXIETY AND IRRITABLITY   Olopatadine HCl 0.2 % SOLN, APPLY 1 DROP TO EYE EVERY MORNING.   traZODone (DESYREL) 100 MG tablet, GIVE 1 TABLET BY MOUTH AT BEDTIME FOR SLEEP   zolpidem (AMBIEN) 10 MG tablet, Take 1 tablet (10 mg total) by mouth at bedtime as needed.  Review of Systems:  Review of system negative unless stated in the problem list or HPI.    Physical Exam:  Vitals:   03/06/22 0851  BP: 105/71  Pulse: 62  Temp: 98.2 F (36.8 C)  TempSrc: Oral  SpO2: 100%  Weight: 260 lb 3.2 oz (118 kg)  Height: 5\' 11"  (1.803 m)    Physical Exam General: NAD HENT: NCAT Lungs: CTAB, no wheeze, rhonchi or rales.  Cardiovascular: Normal heart sounds, no r/m/g, 2+ pulses in all extremities. No LE edema Abdomen: No TTP, normal bowel sounds MSK: No asymmetry or muscle atrophy.  Skin: no lesions noted on exposed skin Neuro: Alert and oriented x4. CN grossly intact Psych: Normal mood and normal affect   Assessment & Plan:   Urinary  incontinence Pt continues to have nights with urinary incontinence. This is not a problem for him during the day but an issue at night time. Pt has continued need for adult diapers.     Subclinical hyperthyroidism Repeat thyroid testing ordered this visit. Pt not endorsing any fatigue, palpitations, night sweats or other symptoms.   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  Patient is able to do all ADLs and IADLs with supervision.  Patient does not drive.  This has been chronic and stable for the last few years.  I believe given the history of neurosyphilis, he will benefit from evaluation by neurology for his forgetfulness. - Referral to neurology placed  Hyperlipidemia Patient has hyperlipidemia but currently not on any medications.  Last lipid panel was done over 1 year ago.  We will repeat lipid panel this visit.  Pre-diabetes Patient has prediabetes.  A1c has been greater than 6 previously but last one done was 5.6.  A1c today is 5.8.  Advised patient to eat healthier and exercise at least 150 minutes/week.  Snoring Patient was not able to do sleep study as sleeping center required the father to stay with him.  After this was arranged the patient lost interested in getting a sleep study.   See Encounters  Tab for problem based charting.  Patient discussed with Dr. Odella Aquas, MD Tillie Rung. [facility] Internal Medicine Residency, PGY-2

## 2022-03-06 NOTE — Assessment & Plan Note (Addendum)
Repeat thyroid testing ordered this visit. Pt not endorsing any fatigue, palpitations, night sweats or other symptoms. Repeat testing similar to previous results with low TSH but normal T3 and FT4.

## 2022-03-06 NOTE — Assessment & Plan Note (Signed)
Patient has hyperlipidemia but currently not on any medications.  Last lipid panel was done over 1 year ago.  We will repeat lipid panel this visit.

## 2022-03-06 NOTE — Assessment & Plan Note (Signed)
Patient was not able to do sleep study as sleeping center required the father to stay with him.  After this was arranged the patient lost interested in getting a sleep study.

## 2022-03-06 NOTE — Patient Instructions (Signed)
Eric Stephens, it was a pleasure seeing you today! You endorsed feeling well today. Below are some of the things we talked about this visit. We look forward to seeing you in the follow up appointment!  Today we discussed: I refilled some medications today. We will do some labs today. I will call you with the results.  We will send you to neurology for the complaint of forgetfulness.   I have ordered the following labs today:   Lab Orders         TSH         T3         T4, Free         Lipid Profile         BMP8+Anion Gap         POC Hbg A1C       Referrals ordered today:    Referral Orders         Ambulatory referral to Neurology      I have ordered the following medication/changed the following medications:   Stop the following medications: Medications Discontinued During This Encounter  Medication Reason   RESTASIS 0.05 % ophthalmic emulsion Patient has not taken in last 30 days   cetirizine (ZYRTEC) 10 MG tablet Reorder   gabapentin (NEURONTIN) 800 MG tablet Reorder     Start the following medications: Meds ordered this encounter  Medications   cetirizine (ZYRTEC) 10 MG tablet    Sig: Seasonal allergies    Dispense:  90 tablet    Refill:  1   gabapentin (NEURONTIN) 800 MG tablet    Sig: TAKE 1 TABLET TWICE A DAY FOR ANXIETY AND IRRITABLITY    Dispense:  180 tablet    Refill:  0     Follow-up: 3 months   Please make sure to arrive 15 minutes prior to your next appointment. If you arrive late, you may be asked to reschedule.   We look forward to seeing you next time. Please call our clinic at [phone] if you have any questions or concerns. The best time to call is Monday-Friday from 9am-4pm, but there is someone available 24/7. If after hours or the weekend, call the main hospital number and ask for the Internal Medicine Resident On-Call. If you need medication refills, please notify your pharmacy one week in advance and they will send [place] a request.  Thank  you for letting us take part in your care. Wishing you the best!  Thank you, Idamae Schuller, MD

## 2022-03-06 NOTE — Assessment & Plan Note (Signed)
Pt's dad states that the patient has been forgetful and lost multiple forms.  Patient is able to do all ADLs and IADLs with supervision.  Patient does not drive.  This has been chronic and stable for the last few years.  I believe given the history of neurosyphilis, he will benefit from evaluation by neurology for his forgetfulness. - Referral to neurology placed

## 2022-03-07 ENCOUNTER — Encounter: Payer: Self-pay | Admitting: Internal Medicine

## 2022-03-07 ENCOUNTER — Ambulatory Visit: Payer: Medicaid Other | Admitting: Podiatry

## 2022-03-07 LAB — BMP8+ANION GAP
Anion Gap: 12 mmol/L (ref 10.0–18.0)
BUN/Creatinine Ratio: 10 (ref 9–20)
BUN: 10 mg/dL (ref 6–24)
CO2: 25 mmol/L (ref 20–29)
Calcium: 9.4 mg/dL (ref 8.7–10.2)
Chloride: 103 mmol/L (ref 96–106)
Creatinine, Ser: 0.98 mg/dL (ref 0.76–1.27)
Glucose: 64 mg/dL — ABNORMAL LOW (ref 70–99)
Potassium: 4 mmol/L (ref 3.5–5.2)
Sodium: 140 mmol/L (ref 134–144)
eGFR: 99 mL/min/{1.73_m2} (ref >59)

## 2022-03-07 LAB — LIPID PANEL
Chol/HDL Ratio: 5.1 ratio — ABNORMAL HIGH (ref 0.0–5.0)
Cholesterol, Total: 230 mg/dL — ABNORMAL HIGH (ref 100–199)
HDL: 45 mg/dL (ref >39)
LDL Chol Calc (NIH): 170 mg/dL — ABNORMAL HIGH (ref 0–99)
Triglycerides: 84 mg/dL (ref 0–149)
VLDL Cholesterol Cal: 15 mg/dL (ref 5–40)

## 2022-03-07 LAB — T4, FREE: Free T4: 1.32 ng/dL (ref 0.82–1.77)

## 2022-03-07 LAB — T3: T3, Total: 135 ng/dL (ref 71–180)

## 2022-03-07 LAB — TSH: TSH: 0.254 u[IU]/mL — ABNORMAL LOW (ref 0.450–4.500)

## 2022-03-07 NOTE — Assessment & Plan Note (Signed)
Counseled on life style modifications including healthy healthy eating and exercising 150 minutes per week at a minimum.

## 2022-03-07 NOTE — Assessment & Plan Note (Addendum)
Pt was evaluated by pulmonology for his DOE. He was advised to get sleep study done and return in 3 months. I spoke with pt's father regarding this and he stated they would like to defer for now.

## 2022-03-07 NOTE — Assessment & Plan Note (Signed)
Pt sees psychiatry for anxiety and insomnia and states they are well controlled. On Trazodone and zolpidem.

## 2022-03-08 ENCOUNTER — Encounter (HOSPITAL_BASED_OUTPATIENT_CLINIC_OR_DEPARTMENT_OTHER): Payer: Medicaid Other | Admitting: Pulmonary Disease

## 2022-03-14 NOTE — Progress Notes (Signed)
Internal Medicine Clinic Attending  Case discussed with Dr. Khan  at the time of the visit.  We reviewed the resident's history and exam and pertinent patient test results.  I agree with the assessment, diagnosis, and plan of care documented in the resident's note.  

## 2022-04-05 ENCOUNTER — Telehealth: Payer: Self-pay | Admitting: Neurology

## 2022-04-05 ENCOUNTER — Ambulatory Visit: Payer: Medicaid Other | Admitting: Neurology

## 2022-04-05 ENCOUNTER — Encounter: Payer: Self-pay | Admitting: Neurology

## 2022-04-05 VITALS — BP 103/67 | HR 79 | Ht 71.0 in | Wt 259.0 lb

## 2022-04-05 DIAGNOSIS — F02B0 Dementia in other diseases classified elsewhere, moderate, without behavioral disturbance, psychotic disturbance, mood disturbance, and anxiety: Secondary | ICD-10-CM

## 2022-04-05 MED ORDER — DONEPEZIL HCL 10 MG PO TABS: 10.0000 mg | ORAL_TABLET | Freq: Every day | ORAL | 11 refills | Status: DC

## 2022-04-05 NOTE — Telephone Encounter (Signed)
Referral for Neuropsychology faxed to [facility]. Phone: [phone], Fax: [phone] 

## 2022-04-05 NOTE — Patient Instructions (Addendum)
Start with Aricept 10 mg (1/2 tablet) nightly for one month then increase to full tablets. Side effect include diarrhea, dizziness and vivid dreams.  MRI Brain with and without contrast  Referral for formal neuropsychiatry evaluation  Continue your other medications  Return in a year

## 2022-04-05 NOTE — Telephone Encounter (Signed)
Normal medicaid no auth required sent to [facility] [phone]

## 2022-04-05 NOTE — Progress Notes (Signed)
[facility]  PATIENT: Eric Stephens DOB: [birth year]  REQUESTING CLINICIAN: Aldine Contes, MD HISTORY FROM: Patient and father  REASON FOR VISIT: Memory loss    HISTORICAL  CHIEF COMPLAINT:  Chief Complaint  Patient presents with   New Patient (Initial Visit)    Rm 12, with father  NP Internal referral for forgetfulness, memory concerns    HISTORY OF PRESENT ILLNESS:  This is a 41 year old gentleman past medical history of HIV, neurosyphilis, diagnosed in 2018 who is presenting with memory problem.  Patient and father have reported decreased memory and worsening memory since his neurosyphilis diagnosis.  He has difficulty with remembering recent conversation, he is repetitive and father also has to remind me him everything.  He will lose track of items and keep losing his phone and charger.  He does live at home and is dependent on parents who are now elderly and are looking into placement.  He does not cook but cleans his room, he does not drive but try to manage his cell phone bill but at time will forget to pay.   TBI:    No past history of TBI Stroke:   no past history of stroke Seizures:   no past history of seizures Sleep: Is good with Trazadone Mood: Yes, has both denies anxiety and depression Family history of Dementia:  Yes, grandmother   Functional status: Dependent in most ADLs and IADLs Patient lives with father . Cooking: No, father feels that he will walking away if he starts cooking Cleaning: Yes, no issues  Shopping: no Bathing: no help needed Toileting: no help needed  Driving: no Bills: Yes, but has been late   Ever left the stove on by accident?: N/A  Forget how to use items around the house?: denies Getting lost going to familiar places?: denies Forgetting loved ones names?: denies Word finding difficulty? denies Sleep: Good with Trazadone    OTHER MEDICAL CONDITIONS: HIV, History of Neurosyphilis    REVIEW OF SYSTEMS:  Full 14 system review of systems performed and negative with exception of: As noted in the HPI   ALLERGIES: No Known Allergies  HOME MEDICATIONS: Outpatient Medications Prior to Visit  Medication Sig Dispense Refill   albuterol (VENTOLIN HFA) 108 (90 Base) MCG/ACT inhaler Inhale 2 puffs into the lungs every 6 (six) hours as needed for wheezing or shortness of breath. 8 g 2   cetirizine (ZYRTEC) 10 MG tablet Seasonal allergies 90 tablet 1   gabapentin (NEURONTIN) 800 MG tablet TAKE 1 TABLET TWICE A DAY FOR ANXIETY AND IRRITABLITY 180 tablet 0   Olopatadine HCl 0.2 % SOLN APPLY 1 DROP TO EYE EVERY MORNING. 2.5 mL 0   traZODone (DESYREL) 100 MG tablet GIVE 1 TABLET BY MOUTH AT BEDTIME FOR SLEEP 90 tablet 0   zolpidem (AMBIEN) 10 MG tablet Take 1 tablet (10 mg total) by mouth at bedtime as needed. 30 tablet 2   No facility-administered medications prior to visit.    PAST MEDICAL HISTORY: Past Medical History:  Diagnosis Date   Anxiety 01/11/2017   Asthma    Bilateral lower extremity edema 02/06/2017   Cataract    NS OU   Catatonia 07/04/2016   Glaucoma    POAG OU   Insomnia 01/11/2017   Onychomycosis of toenail 11/01/2017   Schizophrenia ([place]) 02/04/2016   Sore throat 06/23/2021   Syphilis    Upper respiratory tract infection 07/03/2017    PAST SURGICAL HISTORY: Past Surgical History:  Procedure Laterality Date  WRIST SURGERY      FAMILY HISTORY: Family History  Problem Relation Age of Onset   Hypertension Father    Hypertension Mother    Arthritis Mother     SOCIAL HISTORY: Social History   Socioeconomic History   Marital status: Single    Spouse name: Not on file   Number of children: Not on file   Years of education: Not on file   Highest education level: Not on file  Occupational History   Not on file  Tobacco Use   Smoking status: Some Days    Packs/day: 0.50    Types: Cigarettes   Smokeless tobacco: Never   Tobacco comments:    .5 pks  some days. Hsm.    Vaping Use   Vaping Use: Never used  Substance and Sexual Activity   Alcohol use: No   Drug use: No   Sexual activity: Yes  Other Topics Concern   Not on file  Social History Narrative   Not on file   Social Determinants of Health   Financial Resource Strain: Low Risk  (11/30/2020)   Overall Financial Resource Strain (CARDIA)    Difficulty of Paying Living Expenses: Not hard at all  Food Insecurity: No Food Insecurity (01/19/2022)   Hunger Vital Sign    Worried About Running Out of Food in the Last Year: Never true    Ran Out of Food in the Last Year: Never true  Transportation Needs: No Transportation Needs (01/19/2022)   PRAPARE - Hydrologist (Medical): No    Lack of Transportation (Non-Medical): No  Physical Activity: Not on file  Stress: Not on file  Social Connections: Not on file  Intimate Partner Violence: Not on file    PHYSICAL EXAM  GENERAL EXAM/CONSTITUTIONAL: Vitals:  Vitals:   04/05/22 0940  BP: 103/67  Pulse: 79  Weight: 259 lb (117.5 kg)  Height: 5\' 11"  (1.803 m)   Body mass index is 36.12 kg/m. Wt Readings from Last 3 Encounters:  04/05/22 259 lb (117.5 kg)  03/06/22 260 lb 3.2 oz (118 kg)  02/07/22 262 lb (118.8 kg)   Patient is in no distress; well developed, nourished and groomed; neck is supple   EYES: Pupils round and reactive to light, Visual fields full to confrontation, Extraocular movements intacts,   MUSCULOSKELETAL: Gait, strength, tone, movements noted in Neurologic exam below  NEUROLOGIC: MENTAL STATUS:      No data to display            04/05/2022    9:46 AM  Montreal Cognitive Assessment   Visuospatial/ Executive (0/5) 3  Naming (0/3) 2  Attention: Read list of digits (0/2) 2  Attention: Read list of letters (0/1) 1  Attention: Serial 7 subtraction starting at 100 (0/3) 1  Language: Repeat phrase (0/2) 0  Language : Fluency (0/1) 1  Abstraction (0/2) 2  Delayed Recall (0/5) 0   Orientation (0/6) 6  Total 18     CRANIAL NERVE:  2nd, 3rd, 4th, 6th - pupils equal and reactive to light, visual fields full to confrontation, extraocular muscles intact, no nystagmus 5th - facial sensation symmetric 7th - facial strength symmetric 8th - hearing intact 9th - palate elevates symmetrically, uvula midline 11th - shoulder shrug symmetric 12th - tongue protrusion midline  MOTOR:  normal bulk and tone, full strength in the BUE, BLE  COORDINATION:  finger-nose-finger, fine finger movements normal   GAIT/STATION:  Wide based gait  DIAGNOSTIC DATA (LABS, IMAGING, TESTING) - I reviewed patient records, labs, notes, testing and imaging myself where available.  Lab Results  Component Value Date   WBC 8.7 03/07/2017   HGB 12.3 (L) 03/07/2017   HCT 38.1 (L) 03/07/2017   MCV 86.4 03/07/2017   PLT 277 03/07/2017      Component Value Date/Time   NA 140 03/06/2022 0940   K 4.0 03/06/2022 0940   CL 103 03/06/2022 0940   CO2 25 03/06/2022 0940   GLUCOSE 64 (L) 03/06/2022 0940   GLUCOSE 125 (H) 03/07/2017 0039   BUN 10 03/06/2022 0940   CREATININE 0.98 03/06/2022 0940   CALCIUM 9.4 03/06/2022 0940   PROT 6.4 01/08/2019 1338   ALBUMIN 4.0 01/08/2019 1338   AST 17 01/08/2019 1338   ALT 13 01/08/2019 1338   ALKPHOS 69 01/08/2019 1338   BILITOT 0.2 01/08/2019 1338   GFRNONAA 117 01/08/2019 1338   GFRAA 135 01/08/2019 1338   Lab Results  Component Value Date   CHOL 230 (H) 03/06/2022   HDL 45 03/06/2022   LDLCALC 170 (H) 03/06/2022   TRIG 84 03/06/2022   CHOLHDL 5.1 (H) 03/06/2022   Lab Results  Component Value Date   HGBA1C 5.8 (A) 03/06/2022   No results found for: "VITAMINB12" Lab Results  Component Value Date   TSH 0.254 (L) 03/06/2022    CT Head 12/29/2015 No acute intracranial process. Mild global parenchymal brain volume loss for age.   ASSESSMENT AND PLAN  41 y.o. year old male with history of HIV and neurosyphilis who is  presenting due to worsening of his memory.  He lives at home with his parents who care for him but now they are elderly and are looking for an establishment for patient.  He is dependent in most ADLs.  He is forgetful, repetitive and has to be reminded multiple times, no behavioral disturbance.  He scored 18 out of 30 on the [place].  His CT head in 2017 showed global parenchymal brain volume loss.  At this time, I will start him on Aricept 10 mg, half tablet nightly for 1 month then increase to full tablet.  Side effects of the medication including dizziness, diarrhea and vivid dreams discussed with patient and father.  I will also obtain a brain MRI with and without contrast and will refer the patient for formal neuropsychological testing to confirm the diagnosis, severity and to also obtain a baseline.  I will see him in 1 year for follow-up.   1. 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 Side effect include diarrhea, dizziness and vivid dreams.  MRI Brain with and without contrast  Referral for formal neuropsychiatry evaluation  Continue your other medications  Return in a year     Orders Placed This Encounter  Procedures   MR BRAIN W WO CONTRAST   Ambulatory referral to Neuropsychology    Meds ordered this encounter  Medications   donepezil (ARICEPT) 10 MG tablet    Sig: Take 1 tablet (10 mg total) by mouth at bedtime.    Dispense:  30 tablet    Refill:  11    Return in about 1 year (around 04/06/2023).  I have spent a total of 60 minutes dedicated to this patient today, preparing to see patient, performing a  medically appropriate examination and evaluation, ordering tests and/or medications and procedures, and counseling and educating the  patient/family/caregiver; independently interpreting result and communicating results to the family/patient/caregiver; and documenting clinical information in the electronic medical record.   Alric Ran, MD 04/05/2022, 10:46 AM  [facility] [address], [place], [place] [address] [phone]

## 2022-05-02 ENCOUNTER — Ambulatory Visit
Admission: RE | Admit: 2022-05-02 | Discharge: 2022-05-02 | Disposition: A | Discharge status: Home / Self Care | Payer: Medicaid Other | Source: Ambulatory Visit | Attending: Neurology | Admitting: Neurology

## 2022-05-02 DIAGNOSIS — F02B0 Dementia in other diseases classified elsewhere, moderate, without behavioral disturbance, psychotic disturbance, mood disturbance, and anxiety: Secondary | ICD-10-CM

## 2022-05-02 MED ORDER — GADOPICLENOL 0.5 MMOL/ML IV SOLN
10.0000 mL | Freq: Once | INTRAVENOUS | Status: AC | PRN
  Administered 2022-05-02: 10 mL via INTRAVENOUS

## 2022-05-09 ENCOUNTER — Telehealth: Payer: Self-pay | Admitting: Neurology

## 2022-05-09 NOTE — Telephone Encounter (Signed)
Pt had MRI 05/03/2022 Findings:  FINDINGS:  The brain parenchyma shows no abnormal signal intensities.  No structural lesion, tumor or infarct is noted.  There is mild degree of generalized cerebral atrophy which isslightlyy disproportionate patient's age..  Cortical sulci and gyri show normal appearance.  The subarachnoid spaces and ventricular system appears normal.  Calvarium shows no abnormalities.  Orbits appear unremarkable.  Paranasal sinuses show mild chronic inflammatory changes.  The pituitary gland and cerebellar tonsils appear normal.  Visualized portion of the cervical spine shows mild degenerative changes in the discs.  Flow-voids of large vessels of intracranial circulation appear to be patent.  Postcontrast images do not result in any abnormal areas of enhancement.               IMPRESSION: Unremarkable MRI scan of the brain with and without contrast showing only slight.age disproportionate cortical atrophy.  Overall no significant change compared with CT head 12/29/2015.    Will have work in MD review during Dr. Karie Georges absence.

## 2022-05-09 NOTE — Telephone Encounter (Signed)
Pt is calling. Stated he is following up on MRI results. Pt is requesting a call back from nurse.

## 2022-05-09 NOTE — Telephone Encounter (Signed)
I called the pt and updated on MRI results. He verbalized understanding and apprecaiton.  He would also like for Dr. Teresa Coombs to review next week once he returns and provide any further input.

## 2022-05-09 NOTE — Telephone Encounter (Signed)
Please call patient and advise him that his recent brain MRI with and without contrast did not show any acute findings, chronic findings were seen which were not much different from a prior head CT scan in 2017 which is actually quite reassuring.  No abnormal contrast uptake was seen.

## 2022-05-15 NOTE — Telephone Encounter (Signed)
I called the pt and and lvm(ok per dpr) updating on Dr. Karie Georges recommendation.  Pt advised to call back with any questions/concerns.

## 2022-05-15 NOTE — Telephone Encounter (Signed)
Please call and advise the patient that the recent MRI Brain scan we did was within normal limits. In particular, there were no acute findings, such as a stroke, or mass or blood products. No further action is required on this test at this time. Please remind patient to keep any upcoming appointments or tests and to call us with any interim questions, concerns, problems or updates. Thanks,   Windell Norfolk, MD

## 2022-06-01 ENCOUNTER — Ambulatory Visit: Payer: Self-pay | Admitting: Licensed Clinical Social Worker

## 2022-06-01 NOTE — Patient Outreach (Signed)
SW removed self from care team.   Salia Cangemi, BSW, MSW, LCSW-A  Social Worker [facility] Care Management  [phone] 

## 2022-06-05 ENCOUNTER — Encounter: Payer: Medicaid Other | Admitting: Internal Medicine

## 2022-09-09 ENCOUNTER — Other Ambulatory Visit: Payer: Self-pay | Admitting: Internal Medicine

## 2022-09-09 DIAGNOSIS — F419 Anxiety disorder, unspecified: Secondary | ICD-10-CM

## 2022-10-20 ENCOUNTER — Other Ambulatory Visit: Payer: Self-pay | Admitting: Internal Medicine

## 2022-10-20 DIAGNOSIS — Z9109 Other allergy status, other than to drugs and biological substances: Secondary | ICD-10-CM

## 2022-12-15 ENCOUNTER — Other Ambulatory Visit: Payer: Self-pay | Admitting: Internal Medicine

## 2022-12-15 DIAGNOSIS — F419 Anxiety disorder, unspecified: Secondary | ICD-10-CM

## 2023-02-20 ENCOUNTER — Other Ambulatory Visit: Payer: Self-pay | Admitting: Internal Medicine

## 2023-02-20 DIAGNOSIS — F419 Anxiety disorder, unspecified: Secondary | ICD-10-CM

## 2023-03-20 ENCOUNTER — Other Ambulatory Visit: Payer: Self-pay | Admitting: Internal Medicine

## 2023-03-20 DIAGNOSIS — F419 Anxiety disorder, unspecified: Secondary | ICD-10-CM

## 2023-03-22 ENCOUNTER — Ambulatory Visit: Payer: MEDICAID | Admitting: Student

## 2023-03-22 ENCOUNTER — Encounter: Payer: Self-pay | Admitting: Student

## 2023-03-22 VITALS — BP 130/78 | HR 75 | Temp 98.8°F | Wt 260.2 lb

## 2023-03-22 DIAGNOSIS — R7989 Other specified abnormal findings of blood chemistry: Secondary | ICD-10-CM

## 2023-03-22 DIAGNOSIS — R631 Polydipsia: Secondary | ICD-10-CM

## 2023-03-22 DIAGNOSIS — A523 Neurosyphilis, unspecified: Secondary | ICD-10-CM

## 2023-03-22 DIAGNOSIS — K5900 Constipation, unspecified: Secondary | ICD-10-CM | POA: Diagnosis not present

## 2023-03-22 DIAGNOSIS — E785 Hyperlipidemia, unspecified: Secondary | ICD-10-CM | POA: Diagnosis not present

## 2023-03-22 DIAGNOSIS — F02818 Dementia in other diseases classified elsewhere, unspecified severity, with other behavioral disturbance: Secondary | ICD-10-CM

## 2023-03-22 DIAGNOSIS — Z9109 Other allergy status, other than to drugs and biological substances: Secondary | ICD-10-CM

## 2023-03-22 DIAGNOSIS — Z8619 Personal history of other infectious and parasitic diseases: Secondary | ICD-10-CM

## 2023-03-22 DIAGNOSIS — F419 Anxiety disorder, unspecified: Secondary | ICD-10-CM

## 2023-03-22 DIAGNOSIS — R7303 Prediabetes: Secondary | ICD-10-CM

## 2023-03-22 MED ORDER — OLOPATADINE HCL 0.2 % OP SOLN: 1.0000 [drp] | Freq: Every morning | OPHTHALMIC | 0 refills | Status: AC

## 2023-03-22 MED ORDER — GABAPENTIN 800 MG PO TABS: ORAL_TABLET | ORAL | 0 refills | Status: DC

## 2023-03-22 MED ORDER — CETIRIZINE HCL 10 MG PO TABS: ORAL_TABLET | ORAL | 1 refills | Status: DC

## 2023-03-22 MED ORDER — POLYETHYLENE GLYCOL 3350 17 G PO PACK: 17.0000 g | PACK | Freq: Every day | ORAL | 0 refills | Status: DC

## 2023-03-22 NOTE — Assessment & Plan Note (Addendum)
Patient has a history of neurosyphilis which was treated in the past.  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  He follows with neurology, last seen in October of last year, to return in 1 year.  No new focal neurological deficits per patient and father.  No new weakness, numbness, tingling, gait abnormalities.  Father feels like his memory has continued to get worse over the last year.  Plan: To follow-up with neurology for annual visit, provided patient with neurology's contact information. Integrated behavioral health referral for resources regarding long-term care.

## 2023-03-22 NOTE — Assessment & Plan Note (Signed)
Patient has history of constipation.  Days between bowel movements, strains and has hard bowel movements.  No abdominal pain for me today. Plan: Initiate MiraLAX 17 g daily.

## 2023-03-22 NOTE — Assessment & Plan Note (Signed)
Patient has a longstanding history of polydipsia.  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 Plan: BMP assess electrolytes

## 2023-03-22 NOTE — Assessment & Plan Note (Signed)
History of prediabetes in this patient with BMI 36.  Last visit about 1 year ago Plan: A1c

## 2023-03-22 NOTE — Patient Instructions (Addendum)
Thank you, [name] for allowing [place] to provide your care today.  I have ordered the following tests for you:  Lab Orders         TSH         BMP8+Anion Gap         Hemoglobin A1c         Lipid Profile        Referrals ordered today:   Referral Orders  No referral(s) requested today     I have ordered the following medication/changed the following medications:   Stop the following medications: Medications Discontinued During This Encounter  Medication Reason   traZODone (DESYREL) 100 MG tablet    zolpidem (AMBIEN) 10 MG tablet    Olopatadine HCl 0.2 % SOLN Reorder   cetirizine (ZYRTEC) 10 MG tablet Reorder   gabapentin (NEURONTIN) 800 MG tablet Reorder     Start the following medications: Meds ordered this encounter  Medications   cetirizine (ZYRTEC) 10 MG tablet    Sig: TAKE 1 TABLET BY MOUTH EVERY DAY FOR SEASONAL ALLERGIES    Dispense:  90 tablet    Refill:  1   gabapentin (NEURONTIN) 800 MG tablet    Sig: TAKE 1 TABLET TWICE A DAY FOR ANXIETY AND IRRITABLITY    Dispense:  180 tablet    Refill:  0   Olopatadine HCl 0.2 % SOLN    Sig: Apply 1 drop to eye every morning.    Dispense:  2.5 mL    Refill:  0   polyethylene glycol (MIRALAX) 17 g packet    Sig: Take 17 g by mouth daily.    Dispense:  30 each    Refill:  0       Follow up: 6 months for Routine   Please remember:   Call the Neurologist to Follow up with them:   [facility] [phone], [address] [place], [place] [address] [phone]   We look forward to seeing you next time. Please call our clinic at [phone] if you have any questions or concerns. The best time to call is Monday-Friday from 9am-4pm, but there is someone available 24/7. If after hours or the weekend, call the main hospital number and ask for the Internal Medicine Resident On-Call. If you need medication refills, please notify your pharmacy one week in advance and they  will send [place] a request.   Thank you for trusting me with your care. Wishing you the best!  Lovie Macadamia MD [facility]

## 2023-03-22 NOTE — Progress Notes (Addendum)
Subjective:  CC: Routine follow-up  HPI:  Eric Stephens is a 42 y.o. person with a past medical history stated below and presents today for routine follow-up. Please see problem based assessment and plan for additional details.  Past Medical History:  Diagnosis Date   Anxiety 01/11/2017   Asthma    Bilateral lower extremity edema 02/06/2017   Cataract    NS OU   Catatonia 07/04/2016   Glaucoma    POAG OU   Insomnia 01/11/2017   Onychomycosis of toenail 11/01/2017   Schizophrenia ([facility]) 02/04/2016   Sore throat 06/23/2021   Syphilis    Upper respiratory tract infection 07/03/2017    Current Outpatient Medications on File Prior to Visit  Medication Sig Dispense Refill   albuterol (VENTOLIN HFA) 108 (90 Base) MCG/ACT inhaler Inhale 2 puffs into the lungs every 6 (six) hours as needed for wheezing or shortness of breath. 8 g 2   donepezil (ARICEPT) 10 MG tablet Take 1 tablet (10 mg total) by mouth at bedtime. 30 tablet 11   No current facility-administered medications on file prior to visit.    Review of Systems: Please see assessment and plan for pertinent positives and negatives.  Objective:   Vitals:   03/22/23 1033  BP: 130/78  Pulse: 75  Temp: 98.8 F (37.1 C)  TempSrc: Oral  SpO2: 99%  Weight: 260 lb 3.2 oz (118 kg)    Physical Exam: Constitutional: Well-appearing, in no acute distress Cardiovascular: regular rate and rhythm, no m/r/g Pulmonary/Chest: normal work of breathing on room air, lungs clear to auscultation bilaterally Abdominal: soft, non-tender, non-distended Extremities: No edema of the lower extremities bilaterally Skin: warm and dry Psych: Pleasant mood and affect   Assessment & Plan:  History of neurosyphilis Patient has a history of neurosyphilis which was treated in the past.  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  He follows with neurology, last seen in October of last year, to return in 1  year.  No new focal neurological deficits per patient and father.  No new weakness, numbness, tingling, gait abnormalities.  Father feels like his memory has continued to get worse over the last year.  Plan: To follow-up with neurology for annual visit, provided patient with neurology's contact information. Integrated behavioral health referral for resources regarding long-term care.   Hyperlipidemia Patient was found to have hyperlipidemia on last lipid panel, statin was not initiated at that time.  We will go ahead and repeat lipid panel today and initiate statin if his LDL is significantly increased. Plan: Lipid panel today Statin if elevated   Low TSH level Elevated TSH in the past, felt to be subclinical hypothyroid versus euthyroid sick syndrome.  No signs and symptoms of hypo or hyperthyroidism for me today. Plan: Repeat TSH   Constipation Patient has history of constipation.  Days between bowel movements, strains and has hard bowel movements.  No abdominal pain for me today. Plan: Initiate MiraLAX 17 g daily.   Prediabetes History of prediabetes in this patient with BMI 36.  Last visit about 1 year ago Plan: A1c   Polydipsia Patient has a longstanding history of polydipsia.  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 Plan: BMP assess electrolytes    Patient seen with Dr. Elliot Cousin MD [facility]  PGY-1 Pager: [phone]  Phone: [phone] Date 03/22/2023  Time 5:10  PM

## 2023-03-22 NOTE — Assessment & Plan Note (Signed)
Patient was found to have hyperlipidemia on last lipid panel, statin was not initiated at that time.  We will go ahead and repeat lipid panel today and initiate statin if his LDL is significantly increased. Plan: Lipid panel today Statin if elevated

## 2023-03-22 NOTE — Assessment & Plan Note (Signed)
Elevated TSH in the past, felt to be subclinical hypothyroid versus euthyroid sick syndrome.  No signs and symptoms of hypo or hyperthyroidism for me today. Plan: Repeat TSH

## 2023-03-23 ENCOUNTER — Other Ambulatory Visit: Payer: Self-pay | Admitting: Student

## 2023-03-23 DIAGNOSIS — E785 Hyperlipidemia, unspecified: Secondary | ICD-10-CM

## 2023-03-23 LAB — HEMOGLOBIN A1C
Est. average glucose Bld gHb Est-mCnc: 123 mg/dL
Hgb A1c MFr Bld: 5.9 % — ABNORMAL HIGH (ref 4.8–5.6)

## 2023-03-23 LAB — LIPID PANEL
Chol/HDL Ratio: 5.1 {ratio} — ABNORMAL HIGH (ref 0.0–5.0)
Cholesterol, Total: 213 mg/dL — ABNORMAL HIGH (ref 100–199)
HDL: 42 mg/dL (ref >39)
LDL Chol Calc (NIH): 153 mg/dL — ABNORMAL HIGH (ref 0–99)
Triglycerides: 101 mg/dL (ref 0–149)
VLDL Cholesterol Cal: 18 mg/dL (ref 5–40)

## 2023-03-23 LAB — BMP8+ANION GAP
Anion Gap: 14 mmol/L (ref 10.0–18.0)
BUN/Creatinine Ratio: 11 (ref 9–20)
BUN: 9 mg/dL (ref 6–24)
CO2: 23 mmol/L (ref 20–29)
Calcium: 9.2 mg/dL (ref 8.7–10.2)
Chloride: 108 mmol/L — ABNORMAL HIGH (ref 96–106)
Creatinine, Ser: 0.84 mg/dL (ref 0.76–1.27)
Glucose: 76 mg/dL (ref 70–99)
Potassium: 4 mmol/L (ref 3.5–5.2)
Sodium: 145 mmol/L — ABNORMAL HIGH (ref 134–144)
eGFR: 111 mL/min/{1.73_m2} (ref >59)

## 2023-03-23 LAB — TSH: TSH: 0.713 u[IU]/mL (ref 0.450–4.500)

## 2023-03-23 MED ORDER — ATORVASTATIN CALCIUM 20 MG PO TABS: 20.0000 mg | ORAL_TABLET | Freq: Every day | ORAL | 11 refills | Status: DC

## 2023-03-23 NOTE — Addendum Note (Signed)
Addended by: Lovie Macadamia on: 03/23/2023 02:07 PM   Modules accepted: Level of Service

## 2023-03-28 NOTE — Progress Notes (Signed)
Internal Medicine Clinic Attending  I was physically present during the key portions of the resident provided service and participated in the medical decision making of patient's management care. I reviewed pertinent patient test results.  The assessment, diagnosis, and plan were formulated together and I agree with the documentation in the resident's note.  Mercie Eon, MD

## 2023-04-02 ENCOUNTER — Ambulatory Visit (INDEPENDENT_AMBULATORY_CARE_PROVIDER_SITE_OTHER): Payer: MEDICAID | Admitting: Licensed Clinical Social Worker

## 2023-04-02 DIAGNOSIS — F02818 Dementia in other diseases classified elsewhere, unspecified severity, with other behavioral disturbance: Secondary | ICD-10-CM | POA: Diagnosis not present

## 2023-04-02 NOTE — BH Specialist Note (Signed)
Integrated Behavioral Health Initial In-Person Visit  MRN: [id number] Name: Eric Stephens  Number of Integrated Behavioral Health Clinician visits: 1- Initial Visit  Session Start time: 1330    Session End time: 1400  Total time in minutes: 30   Types of Service: Collaborative care     Subjective: Eric Stephens is a 42 y.o. male accompanied by Father   Patient reports the following symptoms/concerns: Today, [facility] spoke with the patient's father, Eric Stephens,  with the patient's permission, regarding the ongoing search for long-term care placement. The patient is a 42 year old African American male, and the Behavioral Health Consultant [name]. However, challenges have arisen due to the patient's age and the family's specific preferences. During our conversation, I informed the father that we would complete an updated FL2 form to restart the placement search. I also advised him that it would be essential for the family to identify potential facilities that align with their preferences and to schedule visits for tours. We emphasized the importance of family involvement in this process and will continue to support them as they navigate these next steps.     Objective: Mood: NA and Affect: Appropriate Risk of harm to self or others: No plan to harm self or others    Patient and/or Family's Strengths/Protective Factors: Social connections  Goals Addressed: Patient will: Reduce symptoms of:  Placement   Progress towards Goals: Ongoing  Interventions: Interventions utilized: Link to [employer]  Standardized Assessments completed: Not Needed  Patient and/or Family Response: Patient in need of updated FL2      Plan: Follow up with behavioral health clinician on : Patient will contact [facility] if needed. Eric Stephens, MSW, LCSW-A She/Her Behavioral Health Clinician [facility]  Internal Medicine  Center Direct [phone]  Fax [phone] Main Office Phone: [phone] [address] [place] [place], [place] [address] Website: [facility]  [place], [place]  [place]

## 2023-04-23 ENCOUNTER — Other Ambulatory Visit: Payer: Self-pay

## 2023-04-23 DIAGNOSIS — J45909 Unspecified asthma, uncomplicated: Secondary | ICD-10-CM

## 2023-04-24 MED ORDER — ALBUTEROL SULFATE HFA 108 (90 BASE) MCG/ACT IN AERS: 2.0000 | INHALATION_SPRAY | Freq: Four times a day (QID) | RESPIRATORY_TRACT | 2 refills | Status: DC | PRN

## 2023-04-25 ENCOUNTER — Ambulatory Visit (INDEPENDENT_AMBULATORY_CARE_PROVIDER_SITE_OTHER): Payer: MEDICAID | Admitting: Licensed Clinical Social Worker

## 2023-04-25 DIAGNOSIS — F02818 Dementia in other diseases classified elsewhere, unspecified severity, with other behavioral disturbance: Secondary | ICD-10-CM | POA: Diagnosis not present

## 2023-04-25 NOTE — BH Specialist Note (Unsigned)
Integrated Behavioral Health via Telemedicine Visit  04/25/2023 TOREN TUCHOLSKI [id number]  Number of Integrated Behavioral Health Clinician visits: Additional Visit  Session Start time: 1330   Session End time: 1400  Total time in minutes: 30   Referring Provider: PCP Patient/Family location: Home [facility] Provider location: Office All persons participating in visit: [facility],Patient and Father Types of Service: Telephone visit  I connected with Barkley Bruns and/or Juvenal Umar Dipinto's father via  Telephone and verified that I am speaking with the correct person using two identifiers. Discussed confidentiality: Yes   I discussed the limitations of telemedicine and the availability of in person appointments.  Discussed there is a possibility of technology failure and discussed alternative modes of communication if that failure occurs.  I discussed that engaging in this telemedicine visit, they consent to the provision of behavioral healthcare and the services will be billed under their insurance.  Patient and/or legal guardian expressed understanding and consented to Telemedicine visit: Yes   [facility] conducted a telephone consultation with the patient's father, who serves as the primary caregiver. During the call, it was confirmed that the [facility] form has been successfully completed. The [facility] provided comprehensive education to the patient regarding the process and emphasized the importance of identifying a suitable facility that meets everyone's preferences. Assistance in securing placement was offered by the [facility]. The patient's father requested that the form be sent via mail, and the [facility] took the opportunity to verify the correct mailing address. As part of the follow-up process, the [facility] will ensure that the form is scanned into the patient's chart before being mailed out. The patient's father indicated that they will commence the search for an appropriate placement once the form is received at their  residence.  Christen Butter, MSW, LCSW-A She/Her Behavioral Health Clinician [facility]  Internal Medicine Center Direct [phone]  Fax [phone] Main Office Phone: [phone] [address] [place], [place] [address] Website: [facility] Internal Medicine [facility]  [place], [place]  [place]

## 2023-07-18 ENCOUNTER — Other Ambulatory Visit: Payer: Self-pay | Admitting: Student

## 2023-07-18 DIAGNOSIS — F419 Anxiety disorder, unspecified: Secondary | ICD-10-CM

## 2023-08-23 ENCOUNTER — Ambulatory Visit: Payer: MEDICAID | Admitting: Licensed Clinical Social Worker

## 2023-08-23 DIAGNOSIS — F02818 Dementia in other diseases classified elsewhere, unspecified severity, with other behavioral disturbance: Secondary | ICD-10-CM | POA: Diagnosis not present

## 2023-08-23 NOTE — BH Specialist Note (Signed)
 Integrated Behavioral Health via Telemedicine Visit  08/23/2023 Eric Stephens [id number]  Number of Integrated Behavioral Health Clinician visits: Additional Visit  Session Start time: 1430   Session End time: 1450 Total time in minutes: 20  Referring Provider: PCP Patient/Family location: [facility] Provider location: Office All persons participating in visit: [facility] and Patients father Types of Service: Telephone visit  I connected with Eric Stephens and/or Eric Stephens's father via  Telephone oand verified that I am speaking with the correct person using two identifiers. Discussed confidentiality: Yes   I discussed the limitations of telemedicine and the availability of in person appointments.  Discussed there is a possibility of technology failure and discussed alternative modes of communication if that failure occurs.  I discussed that engaging in this telemedicine visit, they consent to the provision of behavioral healthcare and the services will be billed under their insurance.  Patient and/or legal guardian expressed understanding and consented to Telemedicine visit: Yes   Presenting Concerns: Patient and/or family reports the following symptoms/concerns: [facility] contacted patient to inquire on status of placement. Patient still looking for placement. Patient will contact social services to assist with search and notify [facility] and PCP when one is found so a FL2 form can be completed.  I discussed the assessment and treatment plan with the patient and/or parent/guardian. They were provided an opportunity to ask questions and all were answered. They agreed with the plan and demonstrated an understanding of the instructions.   They were advised to call back or seek an in-person evaluation if the symptoms worsen or if the condition fails to improve as anticipated.  Christen Butter, MSW, LCSW-A She/Her Behavioral Health Clinician [facility]  Internal Medicine Center Direct  Dial:[phone]  Fax [phone] Main Office Phone: [phone] [address] [place] [place], [place] [address] Website: [facility] Internal Medicine [facility]  [place], [place]  [place]

## 2023-11-27 ENCOUNTER — Other Ambulatory Visit: Payer: Self-pay | Admitting: Student

## 2023-11-27 DIAGNOSIS — Z9109 Other allergy status, other than to drugs and biological substances: Secondary | ICD-10-CM

## 2023-11-27 NOTE — Telephone Encounter (Signed)
 Medication sent to pharmacy

## 2023-12-03 ENCOUNTER — Encounter: Payer: Self-pay | Admitting: *Deleted

## 2024-01-09 ENCOUNTER — Other Ambulatory Visit: Payer: Self-pay

## 2024-01-09 DIAGNOSIS — F419 Anxiety disorder, unspecified: Secondary | ICD-10-CM

## 2024-01-11 MED ORDER — GABAPENTIN 800 MG PO TABS: ORAL_TABLET | ORAL | 0 refills | Status: DC

## 2024-01-29 ENCOUNTER — Telehealth: Payer: Self-pay

## 2024-01-29 NOTE — Telephone Encounter (Signed)
 Please refer to message below.  Copied from CRM [id number]. Topic: General - Other >> Jan 28, 2024  3:14 PM Mercer PEDLAR wrote: Reason for CRM: Kevonte Vanecek (father) stating that patient received a jury summons and he submitted an appeal due to his disability which was denied. He is requesting letter from PCP to exuse him from jury duty and would like a callback to let him know if that can be done.

## 2024-01-30 ENCOUNTER — Encounter: Payer: Self-pay | Admitting: Student

## 2024-02-02 ENCOUNTER — Other Ambulatory Visit: Payer: Self-pay | Admitting: Student

## 2024-02-02 DIAGNOSIS — F419 Anxiety disorder, unspecified: Secondary | ICD-10-CM

## 2024-02-04 NOTE — Telephone Encounter (Signed)
 Patient was last seen 03/22/23, I called and spoke to the patients father who stated he would have the patient give us  a call back to schedule a appointment.

## 2024-03-25 ENCOUNTER — Other Ambulatory Visit: Payer: Self-pay

## 2024-03-25 ENCOUNTER — Ambulatory Visit (INDEPENDENT_AMBULATORY_CARE_PROVIDER_SITE_OTHER): Payer: MEDICAID

## 2024-03-25 VITALS — BP 106/60 | HR 75 | Temp 98.6°F | Ht 71.0 in | Wt 262.4 lb

## 2024-03-25 DIAGNOSIS — R946 Abnormal results of thyroid function studies: Secondary | ICD-10-CM

## 2024-03-25 DIAGNOSIS — E785 Hyperlipidemia, unspecified: Secondary | ICD-10-CM | POA: Diagnosis not present

## 2024-03-25 DIAGNOSIS — Z8619 Personal history of other infectious and parasitic diseases: Secondary | ICD-10-CM

## 2024-03-25 DIAGNOSIS — F17210 Nicotine dependence, cigarettes, uncomplicated: Secondary | ICD-10-CM

## 2024-03-25 DIAGNOSIS — R7989 Other specified abnormal findings of blood chemistry: Secondary | ICD-10-CM

## 2024-03-25 DIAGNOSIS — R7303 Prediabetes: Secondary | ICD-10-CM

## 2024-03-25 DIAGNOSIS — Z0000 Encounter for general adult medical examination without abnormal findings: Secondary | ICD-10-CM

## 2024-03-25 DIAGNOSIS — H409 Unspecified glaucoma: Secondary | ICD-10-CM | POA: Insufficient documentation

## 2024-03-25 NOTE — Assessment & Plan Note (Signed)
 The patient has a history of low TSH, subclinical hyperthyroidism.  Last TSH 1 year ago was 0.713.  Will repeat today.  The patient denies any symptoms including palpitations, tachycardia, weight loss, or GI upset. Orders:   TSH

## 2024-03-25 NOTE — Progress Notes (Signed)
   Established Patient Office Visit  Subjective   Patient ID: Eric Stephens, male    DOB: [birth year]  Age: 43 y.o. MRN: [id number]  Chief Complaint  Patient presents with   Follow-up   Depression   Panic Attack   [name] is a 43 year old male with past medical history of neurosyphilis, prediabetes, and hyperlipidemia who presents today for routine follow-up.  Please see problem-based assessment and plan below.  Review of Systems  Constitutional:  Negative for chills and fever.  Eyes:  Negative for blurred vision and double vision.  Respiratory:  Negative for shortness of breath.   Cardiovascular:  Negative for chest pain, palpitations and leg swelling.  Gastrointestinal:  Negative for abdominal pain, constipation, diarrhea, nausea and vomiting.  Neurological:  Negative for dizziness, tingling, sensory change and weakness.      Objective:     BP 106/60 (BP Location: Right Arm, Cuff Size: Large)   Pulse 75   Temp 98.6 F (37 C) (Oral)   Ht 5' 11 (1.803 m)   Wt 262 lb 6.4 oz (119 kg)   SpO2 98%   BMI 36.60 kg/m     Const: Awake, alert in NAD HENT: Normocephalic, atraumatic, mucus membranes moist Card: RRR, No MRG, No pitting edema on LE's bilaterally  Resp: LCTAB, no increased work of breathing Abd: Soft, NTND Extremities: Warm, pink      The 10-year ASCVD risk score (Arnett DK, et al., 2019) is: 5.1%    Assessment & Plan:   Assessment & Plan Prediabetes The patient does have a history of prediabetes.  A1c from 1 year ago was 5.9.  Will recheck today. Orders:   Hemoglobin A1c  Hyperlipidemia, unspecified hyperlipidemia type The patient's last lipid profile last October showed a total cholesterol of 213 with an LDL of 153.  ASCVD risk at that time was 7%.  The patient was prescribed Lipitor 20 mg at that time however is not currently taking.  He denies any bothersome side effects and is unsure why he does not take medication anymore.  Will repeat lipid  profile today and reinitiate statin if indicated. Orders:   Lipid Profile  Low TSH level The patient has a history of low TSH, subclinical hyperthyroidism.  Last TSH 1 year ago was 0.713.  Will repeat today.  The patient denies any symptoms including palpitations, tachycardia, weight loss, or GI upset. Orders:   TSH  Healthcare maintenance The patient is here today for routine yearly visit.  He has no acute complaints.  Will obtain labs including A1c, TSH, lipid profile, and BMP. Orders:   Basic metabolic panel with GFR  History of neurosyphilis Patient does have a history of neurosyphilis and has long-term sequelae from that disease process.  He has been working with our behavioral health team in the past to find suitable living arrangements with the help of his dad.  Today he reports that he is feeling stable with no new neurological deficits, aggression, anxiety, or behavioral disturbances.  He is still complaining of memory loss.  His dad reports that he has short-term memory loss and he is getting minimally worse over time.  He has attempted to take donepezil  in the past however it did give him nightmares and he was unable to tolerate.  Discussed with the patient that he should follow-up with his neurologist for management of this disease process and long-term effects.       Schuyler Novak, DO

## 2024-03-25 NOTE — Assessment & Plan Note (Signed)
 The patient's last lipid profile last October showed a total cholesterol of 213 with an LDL of 153.  ASCVD risk at that time was 7%.  The patient was prescribed Lipitor 20 mg at that time however is not currently taking.  He denies any bothersome side effects and is unsure why he does not take medication anymore.  Will repeat lipid profile today and reinitiate statin if indicated. Orders:   Lipid Profile

## 2024-03-25 NOTE — Patient Instructions (Addendum)
 Thank you, [name], for allowing us  to provide your care today. Today we discussed . . .  > Routine Health Maintenance       - We got labs today to check your pre-diabetes, thyroid, cholesterol, and kidney function       - We will call you with the results when they are available.  > Memory loss       - I would recommend following up with your neurologist.   [facility]  [address]  [place], [place] [address] [phone]   I have ordered the following labs for you:  Lab Orders         Basic metabolic panel with GFR         TSH         Lipid Profile         Hemoglobin A1c        Follow up: 1 year    Remember:  Should you have any questions or concerns please call the internal medicine clinic at [phone].     Schuyler Novak, DO [facility] Internal Medicine Center

## 2024-03-25 NOTE — Assessment & Plan Note (Signed)
 Patient does have a history of neurosyphilis and has long-term sequelae from that disease process.  He has been working with our behavioral health team in the past to find suitable living arrangements with the help of his dad.  Today he reports that he is feeling stable with no new neurological deficits, aggression, anxiety, or behavioral disturbances.  He is still complaining of memory loss.  His dad reports that he has short-term memory loss and he is getting minimally worse over time.  He has attempted to take donepezil  in the past however it did give him nightmares and he was unable to tolerate.  Discussed with the patient that he should follow-up with his neurologist for management of this disease process and long-term effects.

## 2024-03-25 NOTE — Assessment & Plan Note (Signed)
 The patient does have a history of prediabetes.  A1c from 1 year ago was 5.9.  Will recheck today. Orders:   Hemoglobin A1c

## 2024-03-26 LAB — BASIC METABOLIC PANEL WITH GFR
BUN/Creatinine Ratio: 17 (ref 9–20)
BUN: 13 mg/dL (ref 6–24)
CO2: 22 mmol/L (ref 20–29)
Calcium: 9.3 mg/dL (ref 8.7–10.2)
Chloride: 104 mmol/L (ref 96–106)
Creatinine, Ser: 0.77 mg/dL (ref 0.76–1.27)
Glucose: 80 mg/dL (ref 70–99)
Potassium: 4.3 mmol/L (ref 3.5–5.2)
Sodium: 139 mmol/L (ref 134–144)
eGFR: 113 mL/min/1.73 (ref >59)

## 2024-03-26 LAB — LIPID PANEL
Chol/HDL Ratio: 5.1 ratio — ABNORMAL HIGH (ref 0.0–5.0)
Cholesterol, Total: 204 mg/dL — ABNORMAL HIGH (ref 100–199)
HDL: 40 mg/dL (ref >39)
LDL Chol Calc (NIH): 136 mg/dL — ABNORMAL HIGH (ref 0–99)
Triglycerides: 154 mg/dL — ABNORMAL HIGH (ref 0–149)
VLDL Cholesterol Cal: 28 mg/dL (ref 5–40)

## 2024-03-26 LAB — TSH: TSH: 0.447 u[IU]/mL — ABNORMAL LOW (ref 0.450–4.500)

## 2024-03-26 LAB — HEMOGLOBIN A1C
Est. average glucose Bld gHb Est-mCnc: 117 mg/dL
Hgb A1c MFr Bld: 5.7 % — ABNORMAL HIGH (ref 4.8–5.6)

## 2024-03-26 NOTE — Addendum Note (Signed)
 Addended by: MYRNA BITTERS on: 03/26/2024 11:43 AM   Modules accepted: Orders

## 2024-03-26 NOTE — Progress Notes (Signed)
 Internal Medicine Clinic Attending  I was physically present during the key portions of the resident provided service and participated in the medical decision making of patient's management care. I reviewed pertinent patient test results.  The assessment, diagnosis, and plan were formulated together and I agree with the documentation in the resident's note.  Lovie Clarity, MD   I think patient would benefit from following up with his Neurologist - his family will call to coordinate this.  We will try to add on free T4 since his TSH level was low today.  The 10-year ASCVD risk score (Arnett DK, et al., 2019) is: 5.1%   Values used to calculate the score:     Age: 43 years     Clincally relevant sex: Male     Is Non-Hispanic African American: Yes     Diabetic: No     Tobacco smoker: Yes     Systolic Blood Pressure: 106 mmHg     Is BP treated: No     HDL Cholesterol: 40 mg/dL     Total Cholesterol: 204 mg/dL   Will need to do shared decision making with patient about his ASCVD risk. He hasn't been taking his statin. With his pre-diabetes and obesity, I think we should consider a moderate-intensity statin - Dr. Myrna to discuss with patient & family.

## 2024-03-27 LAB — T4, FREE: Free T4: 1.1 ng/dL (ref 0.82–1.77)

## 2024-03-27 LAB — SPECIMEN STATUS REPORT

## 2024-03-31 ENCOUNTER — Ambulatory Visit: Payer: Self-pay

## 2024-03-31 MED ORDER — ATORVASTATIN CALCIUM 20 MG PO TABS: 20.0000 mg | ORAL_TABLET | Freq: Every day | ORAL | 11 refills | Status: AC

## 2024-03-31 NOTE — Progress Notes (Signed)
 Total cholesterol and LDL elevated today. ASCVD risk 5.3%. The patient would benefit from a moderate intensity statin. Spoke with the family and will re-initiate lipitor 20mg .

## 2024-03-31 NOTE — Addendum Note (Signed)
 Addended by: MYRNA BITTERS on: 03/31/2024 05:36 PM   Modules accepted: Orders

## 2024-04-28 ENCOUNTER — Ambulatory Visit: Payer: MEDICAID

## 2024-04-28 ENCOUNTER — Other Ambulatory Visit (HOSPITAL_COMMUNITY)
Admission: RE | Admit: 2024-04-28 | Discharge: 2024-04-28 | Disposition: A | Discharge status: Home / Self Care | Payer: MEDICAID | Source: Ambulatory Visit | Attending: Internal Medicine | Admitting: Internal Medicine

## 2024-04-28 ENCOUNTER — Other Ambulatory Visit: Payer: Self-pay | Admitting: Internal Medicine

## 2024-04-28 ENCOUNTER — Ambulatory Visit: Payer: Self-pay

## 2024-04-28 ENCOUNTER — Other Ambulatory Visit: Payer: Self-pay

## 2024-04-28 VITALS — BP 117/79 | HR 81 | Temp 98.1°F | Ht 71.0 in | Wt 259.8 lb

## 2024-04-28 DIAGNOSIS — Z9189 Other specified personal risk factors, not elsewhere classified: Secondary | ICD-10-CM | POA: Insufficient documentation

## 2024-04-28 DIAGNOSIS — R590 Localized enlarged lymph nodes: Secondary | ICD-10-CM | POA: Diagnosis not present

## 2024-04-28 DIAGNOSIS — J45909 Unspecified asthma, uncomplicated: Secondary | ICD-10-CM

## 2024-04-28 NOTE — Telephone Encounter (Signed)
 FYI Only or Action Required?: Action required by provider: request for appointment.  Patient was last seen in primary care on 03/25/2024 by Myrna Bitters, DO.  Called Nurse Triage reporting Neck Pain.  Symptoms began several days ago.  Interventions attempted: Nothing.  Symptoms are: gradually worsening.  Triage Disposition: See PCP When Office is Open (Within 3 Days)  Patient/caregiver understands and will follow disposition?: Yes   Copied from CRM [id number]. Topic: Clinical - Red Word Triage >> Apr 28, 2024  8:22 AM Antonio DEL wrote: Red Word that prompted transfer to Nurse Triage: Patient's father Mr. Dallas is calling, states the patient's neck is swollen on both sides and thinks it may be his throat. Has been going on for a couple of days. No mention of any of symptoms. Patient is with dad and available if needed. Reason for Disposition  [1] MODERATE neck pain (e.g., interferes with normal activities) AND [2] present > 3 days  Answer Assessment - Initial Assessment Questions 1. ONSET: When did the pain begin?      Couple of days ago  2. LOCATION: Where does it hurt?      Both sides of the neck  3. PATTERN Does the pain come and go, or has it been constant since it started?      Comes and goes  4. SEVERITY: How bad is the pain?  (Scale 0-10; or none or slight stiffness, mild, moderate, severe)     Mild to Moderate  5. RADIATION: Does the pain go anywhere else, shoot into your arms?     No  6. CORD SYMPTOMS: Any weakness or numbness of the arms or legs?     No  7. CAUSE: What do you think is causing the neck pain?     Unsure  8. NECK OVERUSE: Any recent activities that involved turning or twisting the neck?     No  9. OTHER SYMPTOMS: Do you have any other symptoms? (e.g., headache, fever, chest pain, difficulty breathing, neck swelling)     Knot on both sides,  Protocols used: Neck Pain or Stiffness-A-AH

## 2024-04-28 NOTE — Assessment & Plan Note (Addendum)
 Patient is endorsing bilateral submandibular swelling for past 3 days in the setting of recent flu-like symptoms.  Only mild soreness at night but does not endorse any pain.  He does have history of subclinical hyperthyroidism.  No history of unexpected weight loss, night sweats, fatigue, fever/chills.  Exam showing mild bilateral submandibular lymphadenopathy.  No palpable lymph nodes in cervical, pre-/post-auricular chains, or axilla.  Symptoms likely represent reactive lymphadenopathy in the setting of acute illness.  His swelling is submandibular and not related to thyroid.  Encouraged the patient we will continue to monitor and if his swelling is not improve in 3-4 weeks, to schedule another appointment for further evaluation.

## 2024-04-28 NOTE — Progress Notes (Signed)
 Patient name: Eric Stephens Date of birth: November 28, 1980 Date of visit: 04/28/24  Type of visit: Acute Office Visit  Subjective   Chief concern:  Chief Complaint  Patient presents with   Acute Visit    PER  E2C2 :throat  pain  Throat  pain    Symptoms began several days ago.   Interventions attempted: Nothing.   Symptoms are: gradually worsening.       Eric Stephens is a 43 y.o. male with a PMHx of neurosyphilis, concern for neurocognitive disorder, obesity, subclinical hyperthyroidism, asthma, insomnia, HLD who presents to [facility] clinic for evaluation of several days of neck pain.  Feels swelling on both sides, sore when he goes to sleep. He first noticed this 3 days ago but does note that his neck swelling has improved over the past few days. He does state that he thought he had the flu for the past two weeks because he was vomiting but this stopped 2 days ago. He is not experiencing any current pain, nausea, or vomiting. Does note some coughing spells occasionally but he thinks this is from his asthma as it improves with albuterol  inhaler.   Denies fevers/chills, sore throat/pain when swallowing, unexpected weight loss, night sweats, hemoptysis, jaw pain, numbness/tingling.  Of note, patient also had an unprotected sexual encounter in summer 2024. Has not had STD testing after that encounter.   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  Current Outpatient Medications  Medication Instructions   albuterol  (VENTOLIN  HFA) 108 (90 Base) MCG/ACT inhaler 2 puffs, Inhalation, Every 6 hours PRN   atorvastatin  (LIPITOR) 20 mg, Oral, Daily   cetirizine  (ZYRTEC ) 10 MG tablet TAKE 1 TABLET BY MOUTH EVERY DAY FOR SEASONAL ALLERGIES   gabapentin  (NEURONTIN ) 800 MG tablet TAKE 1 TABLET TWICE A DAY FOR ANXIETY AND IRRITABLITY   Olopatadine  HCl 0.2 % SOLN 1 drop, Ophthalmic, Every morning    Social History   Tobacco Use   Smoking status: Some Days    Current packs/day: 0.50    Types: Cigarettes   Smokeless tobacco: Never   Tobacco comments:    .5 pks  some days. Hsm.   Vaping Use   Vaping status: Never Used  Substance Use Topics   Alcohol use: No   Drug use: No      Objective  Today's Vitals   04/28/24 1041  BP: 117/79  Pulse: 81  Temp: 98.1 F (36.7 C)  TempSrc: Oral  SpO2: 100%  Weight: 259 lb 12.8 oz (117.8 kg)  Height: 5' 11 (1.803 m)  PainSc: 0-No pain  Body mass index is 36.23 kg/m.   Physical Exam: Constitutional: well-appearing, well-nourished; obese; no acute distress HENT: normocephalic atraumatic, mucous membranes moist, no pharyngeal  erythema/purulence, mildly enlarged submandibular cervical lymph nodes Eyes: conjunctiva non-erythematous Cardiovascular: regular rate and rhythm, no m/r/g Pulmonary/Chest: normal work of breathing on room air, lungs CTAB; no palpable axillary lymph nodes Abdominal: soft, non-tender, non-distended MSK: normal bulk and tone Neurological: alert & oriented x 3, no focal deficit Skin: warm and dry Extremities: BLE without edema or erythema. Psych: normal mood and behavior     Assessment & Plan  Reactive cervical lymphadenopathy Assessment &  Plan: Patient is endorsing bilateral submandibular swelling for past 3 days in the setting of recent flu-like symptoms.  Only mild soreness at night but does not endorse any pain.  He does have history of subclinical hyperthyroidism.  No history of unexpected weight loss, night sweats, fatigue, fever/chills.  Exam showing mild bilateral submandibular lymphadenopathy.  No palpable lymph nodes in cervical, pre-/post-auricular chains, or axilla.  Symptoms likely represent reactive lymphadenopathy in the setting of acute illness.  His swelling is submandibular and not related to thyroid.  Encouraged the patient we will continue to monitor and if his swelling is not improve in 3-4 weeks, to schedule another appointment for further evaluation.   At risk for sexually transmitted disease due to unprotected sex Assessment & Plan: Patient notes prior unprotected sexual encounter in summer 2024.  Was not checked for STDs after that encounter.  Will screen for HIV, reactivation of syphilis, gonorrhea, and chlamydia. - HIV test pending - RPR pending - Urine cytology for gonorrhea/chlamydia pending  Orders: -     HIV Antibody (routine testing w rflx) -     Urine cytology ancillary only -     RPR    Return if symptoms worsen or fail to improve.  Patient case discussed with Dr. Karna, who also saw and evaluated the patient.  Shameeka Silliman, MD [facility]  PGY-1 04/28/2024, 11:39 AM

## 2024-04-28 NOTE — Telephone Encounter (Signed)
 Medication sent to pharmacy

## 2024-04-28 NOTE — Patient Instructions (Signed)
 Thank you, [name] for allowing us  to provide your care today. Today we discussed the following:  - Your neck swelling is likely a lymph node that is enlarged from your recent illness. We will continue monitoring this and if it does not improve over the next 3-4 weeks please call the clinic for another appointment. - We will check your blood and urine for STD screening after your unprotected sexual encounter. This includes testing for HIV, syphilis, gonorrhea, and chlamydia. I will call you with the results.  I have ordered the following labs for you:   Lab Orders         HIV antibody (with reflex)         RPR      Tests ordered today:  Urine cytology for gonorrhea/chlamydia  Referrals ordered today:   Referral Orders  No referral(s) requested today     I have ordered the following medication/changed the following medications:   Stop the following medications: There are no discontinued medications.   Start the following medications: No orders of the defined types were placed in this encounter.    Follow up: If symptoms do not improve    Should you have any questions or concerns please call the Internal Medicine Clinic at [phone].     Eric Milillo, MD [facility] Internal [facility] Center

## 2024-04-28 NOTE — Assessment & Plan Note (Signed)
 Patient notes prior unprotected sexual encounter in summer 2024.  Was not checked for STDs after that encounter.  Will screen for HIV, reactivation of syphilis, gonorrhea, and chlamydia. - HIV test pending - RPR pending - Urine cytology for gonorrhea/chlamydia pending

## 2024-04-28 NOTE — Telephone Encounter (Signed)
 Pt saw Dr Waymond this am.

## 2024-04-29 LAB — URINE CYTOLOGY ANCILLARY ONLY
Chlamydia: NEGATIVE
Comment: NEGATIVE
Comment: NORMAL
Neisseria Gonorrhea: NEGATIVE

## 2024-04-30 ENCOUNTER — Ambulatory Visit: Payer: Self-pay

## 2024-04-30 ENCOUNTER — Telehealth: Payer: Self-pay | Admitting: *Deleted

## 2024-04-30 LAB — RPR, QUANT+TP ABS (REFLEX)
Rapid Plasma Reagin, Quant: 1:8 {titer} — ABNORMAL HIGH
T Pallidum Abs: REACTIVE — AB

## 2024-04-30 LAB — SYPHILIS: RPR W/REFLEX TO RPR TITER AND TREPONEMAL ANTIBODIES, TRADITIONAL SCREENING AND DIAGNOSIS ALGORITHM: RPR Ser Ql: REACTIVE — AB

## 2024-04-30 LAB — HIV ANTIBODY (ROUTINE TESTING W REFLEX): HIV Screen 4th Generation wRfx: NONREACTIVE

## 2024-04-30 NOTE — Telephone Encounter (Signed)
 Will froward to Dr. Waymond who saw patient a few days ago.                                     Copied from CRM [id number]. Topic: Clinical - Medical Advice >> Apr 30, 2024 11:20 AM Farrel B wrote: Reason for CRM: [id number], Joane Shine, [facility], he is needing to speak with someone in regard to the positive syphillis test, he states that pt has repeat diagnosis of from testing and is wondering as far as being tested this time what prompted it. Please call Mr. Shine to advise.

## 2024-05-01 NOTE — Progress Notes (Signed)
 Internal Medicine Clinic Attending  I was physically present during the key portions of the resident provided service and participated in the medical decision making of patient's management care. I reviewed pertinent patient test results.  The assessment, diagnosis, and plan were formulated together and I agree with the documentation in the resident's note.  Increase in RPR titer following unprotected sexual encounter reported in summer 2024. 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  Karna Fellows, MD

## 2024-05-05 ENCOUNTER — Ambulatory Visit: Payer: MEDICAID

## 2024-05-05 DIAGNOSIS — A523 Neurosyphilis, unspecified: Secondary | ICD-10-CM

## 2024-05-05 MED ORDER — PENICILLIN G BENZATHINE 1200000 UNIT/2ML IM SUSY
2.4000 10*6.[IU] | PREFILLED_SYRINGE | Freq: Once | INTRAMUSCULAR | Status: AC
  Administered 2024-05-05: 2.4 10*6.[IU] via INTRAMUSCULAR

## 2024-05-12 ENCOUNTER — Ambulatory Visit: Payer: MEDICAID | Admitting: *Deleted

## 2024-05-12 DIAGNOSIS — Z8619 Personal history of other infectious and parasitic diseases: Secondary | ICD-10-CM | POA: Diagnosis not present

## 2024-05-12 MED ORDER — PENICILLIN G BENZATHINE 1200000 UNIT/2ML IM SUSY
1.2000 10*6.[IU] | PREFILLED_SYRINGE | Freq: Once | INTRAMUSCULAR | Status: AC
  Administered 2024-05-12: 2.4 10*6.[IU] via INTRAMUSCULAR

## 2024-05-19 ENCOUNTER — Ambulatory Visit: Payer: MEDICAID | Admitting: *Deleted

## 2024-05-19 DIAGNOSIS — Z8619 Personal history of other infectious and parasitic diseases: Secondary | ICD-10-CM

## 2024-05-19 MED ORDER — PENICILLIN G BENZATHINE 1200000 UNIT/2ML IM SUSY
2.4000 10*6.[IU] | PREFILLED_SYRINGE | Freq: Once | INTRAMUSCULAR | Status: AC
  Administered 2024-05-19: 2.4 10*6.[IU] via INTRAMUSCULAR

## 2024-06-11 ENCOUNTER — Ambulatory Visit: Payer: MEDICAID | Admitting: Neurology

## 2024-06-11 ENCOUNTER — Encounter: Payer: Self-pay | Admitting: Neurology

## 2024-06-11 VITALS — BP 115/76 | HR 74 | Ht 71.0 in | Wt 260.0 lb

## 2024-06-11 DIAGNOSIS — R413 Other amnesia: Secondary | ICD-10-CM | POA: Diagnosis not present

## 2024-06-11 DIAGNOSIS — F02A0 Dementia in other diseases classified elsewhere, mild, without behavioral disturbance, psychotic disturbance, mood disturbance, and anxiety: Secondary | ICD-10-CM

## 2024-06-11 NOTE — Progress Notes (Signed)
 "   [facility]  PATIENT: Eric Stephens DOB: 05-21-1981  REQUESTING CLINICIAN: Myrna Bitters, DO HISTORY FROM: Patient and father  REASON FOR VISIT: Memory loss    HISTORICAL  CHIEF COMPLAINT:  Chief Complaint  Patient presents with   Follow-up    Room 12 With father Moca score= 63 Memory getting worse    INTERVAL HISTORY 06/11/2024 Patient presents today for follow-up, last visit were 2 years ago.  Since then, father tells me that he is stable. At time he is forgetful but overall he is stable.  In the past he has tried Aricept  but it caused him nightmares, therefore Aricept  discontinued.  He still independent in his ADLs.  He does exercise walks a lot, does not he need help with self-care but needs reminders about appointments and other important dates   HISTORY OF PRESENT ILLNESS:  This is a 43 year old gentleman past medical history of HIV, neurosyphilis, diagnosed in 2018 who is presenting with memory problem.  Patient and father have reported decreased memory and worsening memory since his neurosyphilis diagnosis.  He has difficulty with remembering recent conversation, he is repetitive and father also has to remind me him everything.  He will lose track of items and keep losing his phone and charger.  He does live at home and is dependent on parents who are now elderly and are looking into placement.  He does not cook but cleans his room, he does not drive but try to manage his cell phone bill but at time will forget to pay.   TBI:    No past history of TBI Stroke:   no past history of stroke Seizures:   no past history of seizures Sleep: Is good with Trazadone Mood: Yes, has both denies anxiety and depression Family history of Dementia:  Yes, grandmother   Functional status: Dependent in most ADLs and IADLs Patient lives with father . Cooking: No, father feels that he will walking away if he starts cooking Cleaning: Yes, no issues  Shopping:  no Bathing: no help needed Toileting: no help needed  Driving: no Bills: Yes, but has been late   Ever left the stove on by accident?: N/A  Forget how to use items around the house?: denies Getting lost going to familiar places?: denies Forgetting loved ones names?: denies Word finding difficulty? denies Sleep: Good with Trazadone    OTHER MEDICAL CONDITIONS: HIV, History of Neurosyphilis    REVIEW OF SYSTEMS: Full 14 system review of systems performed and negative with exception of: As noted in the HPI   ALLERGIES: No Known Allergies  HOME MEDICATIONS: Outpatient Medications Prior to Visit  Medication Sig Dispense Refill   atorvastatin  (LIPITOR) 20 MG tablet Take 1 tablet (20 mg total) by mouth daily. 30 tablet 11   cetirizine  (ZYRTEC ) 10 MG tablet TAKE 1 TABLET BY MOUTH EVERY DAY FOR SEASONAL ALLERGIES 90 tablet 1   gabapentin  (NEURONTIN ) 800 MG tablet TAKE 1 TABLET TWICE A DAY FOR ANXIETY AND IRRITABLITY 180 tablet 0   Olopatadine  HCl 0.2 % SOLN Apply 1 drop to eye every morning. 2.5 mL 0   VENTOLIN  HFA 108 (90 Base) MCG/ACT inhaler TAKE 2 PUFFS BY MOUTH EVERY 6 HOURS AS NEEDED FOR WHEEZE OR SHORTNESS OF BREATH 18 each 2   No facility-administered medications prior to visit.    PAST MEDICAL HISTORY: Past Medical History:  Diagnosis Date   Anxiety 01/11/2017   Asthma    Bilateral lower extremity edema 02/06/2017  Cataract    NS OU   Catatonia 07/04/2016   Glaucoma    POAG OU   Insomnia 01/11/2017   Onychomycosis of toenail 11/01/2017   Schizophrenia (HCC) 02/04/2016   Sore throat 06/23/2021   Syphilis    Upper respiratory tract infection 07/03/2017    PAST SURGICAL HISTORY: Past Surgical History:  Procedure Laterality Date   WRIST SURGERY      FAMILY HISTORY: Family History  Problem Relation Age of Onset   Hypertension Father    Hypertension Mother    Arthritis Mother     SOCIAL HISTORY: Social History   Socioeconomic History   Marital status: Single     Spouse name: Not on file   Number of children: Not on file   Years of education: Not on file   Highest education level: Not on file  Occupational History   Not on file  Tobacco Use   Smoking status: Some Days    Current packs/day: 0.50    Types: Cigarettes   Smokeless tobacco: Never   Tobacco comments:    .5 pks  some days. Hsm.   Vaping Use   Vaping status: Never Used  Substance and Sexual Activity   Alcohol use: No   Drug use: No   Sexual activity: Yes  Other Topics Concern   Not on file  Social History Narrative   Not on file   Social Drivers of Health   Tobacco Use: High Risk (06/11/2024)   Patient History    Smoking Tobacco Use: Some Days    Smokeless Tobacco Use: Never    Passive Exposure: Not on file  Financial Resource Strain: Not on file  Food Insecurity: No Food Insecurity (01/19/2022)   Hunger Vital Sign    Worried About Running Out of Food in the Last Year: Never true    Ran Out of Food in the Last Year: Never true  Transportation Needs: No Transportation Needs (01/19/2022)   PRAPARE - Administrator, Civil Service (Medical): No    Lack of Transportation (Non-Medical): No  Physical Activity: Not on file  Stress: Not on file  Social Connections: Not on file  Intimate Partner Violence: Not on file  Depression [phone]): Low Risk (04/28/2024)   Depression (PHQ2-9)    PHQ-2 Score: 0  Alcohol Screen: Not on file  Housing: Low Risk (01/19/2022)   Housing    Last Housing Risk Score: 0  Utilities: Not on file  Health Literacy: Not on file    PHYSICAL EXAM  GENERAL EXAM/CONSTITUTIONAL: Vitals:  Vitals:   06/11/24 0908  BP: 115/76  Pulse: 74  SpO2: 95%  Weight: 260 lb (117.9 kg)  Height: 5' 11 (1.803 m)   Body mass index is 36.26 kg/m. Wt Readings from Last 3 Encounters:  06/11/24 260 lb (117.9 kg)  04/28/24 259 lb 12.8 oz (117.8 kg)  03/25/24 262 lb 6.4 oz (119 kg)   Patient is in no distress; well developed, nourished and groomed;  neck is supple  MUSCULOSKELETAL: Gait, strength, tone, movements noted in Neurologic exam below  NEUROLOGIC: MENTAL STATUS:      No data to display            06/11/2024    9:10 AM 04/05/2022    9:46 AM  Montreal Cognitive Assessment   Visuospatial/ Executive (0/5) 0 3  Naming (0/3) 3 2  Attention: Read list of digits (0/2) 2 2  Attention: Read list of letters (0/1) 1 1  Attention: Serial 7  subtraction starting at 100 (0/3) 0 1  Language: Repeat phrase (0/2) 1 0  Language : Fluency (0/1) 1 1  Abstraction (0/2) 2 2  Delayed Recall (0/5) 3 0  Orientation (0/6) 3 6  Total 16 18  Adjusted Score (based on education) 16     CRANIAL NERVE:  2nd, 3rd, 4th, 6th - pupils equal and reactive to light, visual fields full to confrontation, extraocular muscles intact, no nystagmus 5th - facial sensation symmetric 7th - facial strength symmetric 8th - hearing intact 9th - palate elevates symmetrically, uvula midline 11th - shoulder shrug symmetric 12th - tongue protrusion midline  MOTOR:  normal bulk and tone, full strength in the BUE, BLE  COORDINATION:  finger-nose-finger, fine finger movements normal   GAIT/STATION:  Wide based gait    DIAGNOSTIC DATA (LABS, IMAGING, TESTING) - I reviewed patient records, labs, notes, testing and imaging myself where available.  Lab Results  Component Value Date   WBC 8.7 03/07/2017   HGB 12.3 (L) 03/07/2017   HCT 38.1 (L) 03/07/2017   MCV 86.4 03/07/2017   PLT 277 03/07/2017      Component Value Date/Time   NA 139 03/25/2024 1446   K 4.3 03/25/2024 1446   CL 104 03/25/2024 1446   CO2 22 03/25/2024 1446   GLUCOSE 80 03/25/2024 1446   GLUCOSE 125 (H) 03/07/2017 0039   BUN 13 03/25/2024 1446   CREATININE 0.77 03/25/2024 1446   CALCIUM  9.3 03/25/2024 1446   PROT 6.4 01/08/2019 1338   ALBUMIN 4.0 01/08/2019 1338   AST 17 01/08/2019 1338   ALT 13 01/08/2019 1338   ALKPHOS 69 01/08/2019 1338   BILITOT 0.2 01/08/2019 1338    GFRNONAA 117 01/08/2019 1338   GFRAA 135 01/08/2019 1338   Lab Results  Component Value Date   CHOL 204 (H) 03/25/2024   HDL 40 03/25/2024   LDLCALC 136 (H) 03/25/2024   TRIG 154 (H) 03/25/2024   CHOLHDL 5.1 (H) 03/25/2024   Lab Results  Component Value Date   HGBA1C 5.7 (H) 03/25/2024   No results found for: VITAMINB12 Lab Results  Component Value Date   TSH 0.447 (L) 03/25/2024    CT Head 12/29/2015 No acute intracranial process. Mild global parenchymal brain volume loss for age.SABRA  MRI Brain 05/02/2022 Unremarkable MRI scan of the brain with and without contrast showing only slight.age disproportionate cortical atrophy. Overall no significant change compared with CT head 12/29/2015.    ASSESSMENT AND PLAN  43 y.o. year old male with history of HIV and neurosyphilis who is presenting for dementia follow up.  He lives at home with his parents who care for him.  He is dependent in most ADLs.  He is forgetful, repetitive and has to be reminded multiple times, no behavioral disturbance.  He was not able to tolerate Aricept  due to side effects. Patient will continue current medications, and I will continue to see him on a yearly basis.    1. Memory loss   2. Mild dementia associated with other underlying disease, without behavioral disturbance, psychotic disturbance, mood disturbance, or anxiety (HCC)      Patient Instructions  Continue current medications  Continue to follow up with PCP  Return in a year  There are well-accepted and sensible ways to reduce risk for Alzheimers disease and other degenerative brain disorders .  Exercise Daily Walk A daily 20 minute walk should be part of your routine. Disease related apathy can be a significant roadblock to exercise and  the only way to overcome this is to make it a daily routine and perhaps have a reward at the end (something your loved one loves to eat or drink perhaps) or a personal trainer coming to the home can also be very  useful. Most importantly, the patient is much more likely to exercise if the caregiver / spouse does it with him/her. In general a structured, repetitive schedule is best.  General Health: Any diseases which effect your body will effect your brain such as a pneumonia, urinary infection, blood clot, heart attack or stroke. Keep contact with your primary care doctor for regular follow ups.  Sleep. A good nights sleep is healthy for the brain. Seven hours is recommended. If you have insomnia or poor sleep habits we can give you some instructions. If you have sleep apnea wear your mask.  Diet: Eating a heart healthy diet is also a good idea; fish and poultry instead of red meat, nuts (mostly non-peanuts), vegetables, fruits, olive oil or canola oil (instead of butter), minimal salt (use other spices to flavor foods), whole grain rice, bread, cereal and pasta and wine in moderation.Research is now showing that the MIND diet, which is a combination of The Mediterranean diet and the DASH diet, is beneficial for cognitive processing and longevity. Information about this diet can be found in The MIND Diet, a book by Annitta Feeling, MS, RDN, and online at wildwildscience.es  Finances, Power of [address] and Advance Directives: You should consider putting legal safeguards in place with regard to financial and medical decision making. While the spouse always has power of attorney for medical and financial issues in the absence of any form, you should consider what you want in case the spouse / caregiver is no longer around or capable of making decisions.   No orders of the defined types were placed in this encounter.   No orders of the defined types were placed in this encounter.   Return in about 1 year (around 06/11/2025).   Pastor Falling, MD 06/11/2024, 10:08 AM  [facility] [address] [place], [place] [address] [phone]  "

## 2024-06-11 NOTE — Patient Instructions (Addendum)
 Continue current medications  Continue to follow up with PCP  Return in a year  There are well-accepted and sensible ways to reduce risk for Alzheimers disease and other degenerative brain disorders .  Exercise Daily Walk A daily 20 minute walk should be part of your routine. Disease related apathy can be a significant roadblock to exercise and the only way to overcome this is to make it a daily routine and perhaps have a reward at the end (something your loved one loves to eat or drink perhaps) or a personal trainer coming to the home can also be very useful. Most importantly, the patient is much more likely to exercise if the caregiver / spouse does it with him/her. In general a structured, repetitive schedule is best.  General Health: Any diseases which effect your body will effect your brain such as a pneumonia, urinary infection, blood clot, heart attack or stroke. Keep contact with your primary care doctor for regular follow ups.  Sleep. A good nights sleep is healthy for the brain. Seven hours is recommended. If you have insomnia or poor sleep habits we can give you some instructions. If you have sleep apnea wear your mask.  Diet: Eating a heart healthy diet is also a good idea; fish and poultry instead of red meat, nuts (mostly non-peanuts), vegetables, fruits, olive oil or canola oil (instead of butter), minimal salt (use other spices to flavor foods), whole grain rice, bread, cereal and pasta and wine in moderation.Research is now showing that the MIND diet, which is a combination of The Mediterranean diet and the DASH diet, is beneficial for cognitive processing and longevity. Information about this diet can be found in The MIND Diet, a book by Annitta Feeling, MS, RDN, and online at wildwildscience.es  Finances, Power of [address] and Advance Directives: You should consider putting legal safeguards in place with regard to financial and medical decision making. While  the spouse always has power of attorney for medical and financial issues in the absence of any form, you should consider what you want in case the spouse / caregiver is no longer around or capable of making decisions.

## 2024-06-14 ENCOUNTER — Other Ambulatory Visit: Payer: Self-pay | Admitting: Internal Medicine

## 2024-06-14 DIAGNOSIS — Z9109 Other allergy status, other than to drugs and biological substances: Secondary | ICD-10-CM

## 2024-06-25 ENCOUNTER — Ambulatory Visit (INDEPENDENT_AMBULATORY_CARE_PROVIDER_SITE_OTHER): Payer: MEDICAID | Admitting: Podiatry

## 2024-06-25 ENCOUNTER — Encounter: Payer: Self-pay | Admitting: Podiatry

## 2024-06-25 DIAGNOSIS — B351 Tinea unguium: Secondary | ICD-10-CM | POA: Diagnosis not present

## 2024-06-25 DIAGNOSIS — M79674 Pain in right toe(s): Secondary | ICD-10-CM | POA: Diagnosis not present

## 2024-06-25 DIAGNOSIS — M79675 Pain in left toe(s): Secondary | ICD-10-CM

## 2024-06-25 NOTE — Progress Notes (Signed)
"  °  Subjective:  Patient ID: Eric Stephens, male    DOB: [birth year],  MRN: [id number]  Eric Stephens presents to clinic today for painful mycotic toenails of both feet that are difficult to trim. Pain interferes with daily activities and wearing enclosed shoe gear comfortably. He is accompanied by his Father on today's visit. Chief Complaint  Patient presents with   RFC    Rm15 Routine foot care/ Dr. Schuyler Novak last visit October 2025   New problem(s): None.   PCP is Novak Schuyler, DO.  Allergies[1]  Review of Systems: Negative except as noted in the HPI.  Objective: No changes noted in today's physical examination. There were no vitals filed for this visit. Eric Stephens is a pleasant 44 y.o. male morbidly obese in NAD. AAO x 3.  Vascular Examination: Capillary refill time immediate b/l. Vascular status intact b/l with palpable pedal pulses. Pedal hair present b/l. No pain with calf compression b/l. Skin temperature gradient WNL b/l. No cyanosis or clubbing b/l. No ischemia or gangrene noted b/l. Trace edema noted BLE.  Neurological Examination: Sensation grossly intact b/l with 10 gram monofilament. Vibratory sensation intact b/l.   Dermatological Examination: Pedal skin with normal turgor, texture and tone b/l.  No open wounds. No interdigital macerations.   Toenails 1-5 b/l thick, discolored, elongated with subungual debris and pain on dorsal palpation.   No hyperkeratotic nor porokeratotic lesions.  Musculoskeletal Examination: Muscle strength 5/5 to all lower extremity muscle groups bilaterally. Hammertoe deformity noted 2-5 b/l.SABRA No pain, crepitus or joint limitation noted with ROM b/l LE.  Patient ambulates independently without assistive aids.  Radiographs: None  Last A1c:      Latest Ref Rng & Units 03/25/2024    2:46 PM  Hemoglobin A1C  Hemoglobin-A1c 4.8 - 5.6 % 5.7    Assessment/Plan: 1. Pain due to onychomycosis of toenails of both feet   Patient was evaluated  and treated. All patient's and/or POA's questions/concerns addressed on today's visit. Toenails 1-5 b/l debrided in length and girth without incident. Continue soft, supportive shoe gear daily. Report any pedal injuries to medical professional. Call office if there are any questions/concerns. -Patient/POA to call should there be question/concern in the interim.   Return in about 3 months (around 09/23/2024).  Eric Stephens, DPM      [place] LOCATION: [address] [place] [address]                   Office [phone]   [facility] LOCATION: [address] [place], [place] [address] Office [phone]      [1] No Known Allergies  "

## 2024-06-30 ENCOUNTER — Encounter: Payer: Self-pay | Admitting: Podiatry

## 2024-07-01 ENCOUNTER — Emergency Department (HOSPITAL_COMMUNITY): Payer: MEDICAID

## 2024-07-01 ENCOUNTER — Emergency Department (HOSPITAL_COMMUNITY)
Admission: EM | Admit: 2024-07-01 | Discharge: 2024-07-01 | Discharge status: Against Medical Advice | Payer: MEDICAID | Attending: Emergency Medicine | Admitting: Emergency Medicine

## 2024-07-01 ENCOUNTER — Encounter (HOSPITAL_COMMUNITY): Payer: Self-pay | Admitting: Emergency Medicine

## 2024-07-01 ENCOUNTER — Other Ambulatory Visit: Payer: Self-pay

## 2024-07-01 DIAGNOSIS — Y9339 Activity, other involving climbing, rappelling and jumping off: Secondary | ICD-10-CM | POA: Insufficient documentation

## 2024-07-01 DIAGNOSIS — W1789XA Other fall from one level to another, initial encounter: Secondary | ICD-10-CM | POA: Insufficient documentation

## 2024-07-01 DIAGNOSIS — M25572 Pain in left ankle and joints of left foot: Secondary | ICD-10-CM | POA: Insufficient documentation

## 2024-07-01 DIAGNOSIS — Z5321 Procedure and treatment not carried out due to patient leaving prior to being seen by health care provider: Secondary | ICD-10-CM | POA: Insufficient documentation

## 2024-07-01 NOTE — ED Provider Triage Note (Signed)
 Emergency Medicine Provider Triage Evaluation Note  Eric Stephens , a 43 y.o. male  was evaluated in triage.  Pt complains of left foot and ankle pain.  Patient reports that he jumped off a 5 foot high wall and hurt his left foot and ankle earlier today.  It is difficult to elucidate why he jumped off the wall.  He denies being pushed or assaulted.  He denies active participation in parkour.  He is ambulatory with mildly antalgic gait.  Review of Systems  Positive: Left foot and ankle pain Negative: Other injury  Physical Exam  BP 119/89 (BP Location: Left Arm)   Pulse 98   Temp 98.6 F (37 C)   Resp (!) 1   Ht 5' 11 (1.803 m)   Wt 117.9 kg   SpO2 99%   BMI 36.26 kg/m  Gen:   Awake, no distress   Resp:  Normal effort  MSK:   Moves extremities without difficulty  Other:    Medical Decision Making  Medically screening exam initiated at 7:38 PM.  Appropriate orders placed.  REEDY Stephens was informed that the remainder of the evaluation will be completed by another provider, this initial triage assessment does not replace that evaluation, and the importance of remaining in the ED until their evaluation is complete.     Eric Maude BROCKS, MD 07/01/24 [phone]

## 2024-07-01 NOTE — ED Triage Notes (Signed)
 Patient reports left ankle pain after jumping off 89ft wall and landing of feet. Denies head trauma during jump.

## 2024-10-01 ENCOUNTER — Ambulatory Visit: Payer: MEDICAID | Admitting: Podiatry
